# Patient Record
Sex: Female | Born: 1960 | Race: Black or African American | Hispanic: No | Marital: Married | Smoking: Former smoker
Health system: Southern US, Community
[De-identification: ages and names within clinical notes are randomized; demographics above are authoritative.]

## PROBLEM LIST (undated history)

## (undated) DIAGNOSIS — E119 Type 2 diabetes mellitus without complications: Secondary | ICD-10-CM

## (undated) DIAGNOSIS — E785 Hyperlipidemia, unspecified: Secondary | ICD-10-CM

## (undated) DIAGNOSIS — Z9289 Personal history of other medical treatment: Secondary | ICD-10-CM

## (undated) DIAGNOSIS — K5900 Constipation, unspecified: Secondary | ICD-10-CM

## (undated) DIAGNOSIS — T7840XA Allergy, unspecified, initial encounter: Secondary | ICD-10-CM

## (undated) HISTORY — DX: Hyperlipidemia, unspecified: E78.5

## (undated) HISTORY — PX: OTHER SURGICAL HISTORY: SHX169

## (undated) HISTORY — DX: Type 2 diabetes mellitus without complications: E11.9

## (undated) HISTORY — PX: MOUTH SURGERY: SHX715

## (undated) HISTORY — DX: Personal history of other medical treatment: Z92.89

## (undated) HISTORY — DX: Constipation, unspecified: K59.00

## (undated) HISTORY — DX: Allergy, unspecified, initial encounter: T78.40XA

## (undated) HISTORY — PX: ECTOPIC PREGNANCY SURGERY: SHX613

---

## 2006-01-17 ENCOUNTER — Emergency Department (HOSPITAL_COMMUNITY): Admission: EM | Admit: 2006-01-17 | Discharge: 2006-01-17 | Payer: Self-pay | Admitting: Emergency Medicine

## 2006-12-29 ENCOUNTER — Emergency Department (HOSPITAL_COMMUNITY): Admission: EM | Admit: 2006-12-29 | Discharge: 2006-12-30 | Payer: Self-pay | Admitting: Emergency Medicine

## 2007-03-11 ENCOUNTER — Emergency Department (HOSPITAL_COMMUNITY): Admission: EM | Admit: 2007-03-11 | Discharge: 2007-03-11 | Payer: Self-pay | Admitting: Emergency Medicine

## 2007-03-12 ENCOUNTER — Ambulatory Visit: Payer: Self-pay | Admitting: *Deleted

## 2007-09-13 ENCOUNTER — Emergency Department (HOSPITAL_COMMUNITY): Admission: EM | Admit: 2007-09-13 | Discharge: 2007-09-14 | Payer: Self-pay | Admitting: Emergency Medicine

## 2008-03-13 ENCOUNTER — Ambulatory Visit: Payer: Self-pay | Admitting: Nurse Practitioner

## 2008-03-13 DIAGNOSIS — F17200 Nicotine dependence, unspecified, uncomplicated: Secondary | ICD-10-CM | POA: Insufficient documentation

## 2008-03-13 DIAGNOSIS — N951 Menopausal and female climacteric states: Secondary | ICD-10-CM | POA: Insufficient documentation

## 2008-03-13 DIAGNOSIS — R51 Headache: Secondary | ICD-10-CM | POA: Insufficient documentation

## 2008-03-13 DIAGNOSIS — R519 Headache, unspecified: Secondary | ICD-10-CM | POA: Insufficient documentation

## 2008-03-13 DIAGNOSIS — E669 Obesity, unspecified: Secondary | ICD-10-CM | POA: Insufficient documentation

## 2008-03-13 DIAGNOSIS — Z9189 Other specified personal risk factors, not elsewhere classified: Secondary | ICD-10-CM | POA: Insufficient documentation

## 2008-03-14 ENCOUNTER — Encounter (INDEPENDENT_AMBULATORY_CARE_PROVIDER_SITE_OTHER): Payer: Self-pay | Admitting: Nurse Practitioner

## 2008-03-14 ENCOUNTER — Ambulatory Visit (HOSPITAL_COMMUNITY): Admission: RE | Admit: 2008-03-14 | Discharge: 2008-03-14 | Payer: Self-pay | Admitting: Internal Medicine

## 2008-03-17 ENCOUNTER — Telehealth (INDEPENDENT_AMBULATORY_CARE_PROVIDER_SITE_OTHER): Payer: Self-pay | Admitting: Nurse Practitioner

## 2008-04-21 ENCOUNTER — Ambulatory Visit: Payer: Self-pay | Admitting: Nurse Practitioner

## 2008-04-21 DIAGNOSIS — K029 Dental caries, unspecified: Secondary | ICD-10-CM | POA: Insufficient documentation

## 2008-04-21 LAB — CONVERTED CEMR LAB
Glucose, Urine, Semiquant: NEGATIVE
Nitrite: NEGATIVE
Protein, U semiquant: NEGATIVE
Urobilinogen, UA: 0.2
WBC Urine, dipstick: NEGATIVE
pH: 5

## 2008-04-22 ENCOUNTER — Encounter (INDEPENDENT_AMBULATORY_CARE_PROVIDER_SITE_OTHER): Payer: Self-pay | Admitting: Nurse Practitioner

## 2008-04-22 LAB — CONVERTED CEMR LAB
Alkaline Phosphatase: 110 units/L (ref 39–117)
BUN: 18 mg/dL (ref 6–23)
CO2: 22 meq/L (ref 19–32)
Cholesterol: 256 mg/dL — ABNORMAL HIGH (ref 0–200)
Eosinophils Absolute: 0.1 10*3/uL (ref 0.0–0.7)
Eosinophils Relative: 1 % (ref 0–5)
Glucose, Bld: 85 mg/dL (ref 70–99)
HCT: 41.4 % (ref 36.0–46.0)
HDL: 35 mg/dL — ABNORMAL LOW (ref >39)
Hemoglobin: 13.5 g/dL (ref 12.0–15.0)
LDL Cholesterol: 157 mg/dL — ABNORMAL HIGH (ref 0–99)
Lymphocytes Relative: 50 % — ABNORMAL HIGH (ref 12–46)
Lymphs Abs: 4.8 10*3/uL — ABNORMAL HIGH (ref 0.7–4.0)
MCV: 83.3 fL (ref 78.0–100.0)
Monocytes Absolute: 0.7 10*3/uL (ref 0.1–1.0)
Monocytes Relative: 8 % (ref 3–12)
RBC: 4.97 M/uL (ref 3.87–5.11)
Total Bilirubin: 0.4 mg/dL (ref 0.3–1.2)
Triglycerides: 320 mg/dL — ABNORMAL HIGH (ref <150)
VLDL: 64 mg/dL — ABNORMAL HIGH (ref 0–40)
WBC: 9.4 10*3/uL (ref 4.0–10.5)

## 2008-05-08 ENCOUNTER — Ambulatory Visit: Payer: Self-pay | Admitting: Nurse Practitioner

## 2008-05-09 ENCOUNTER — Encounter (INDEPENDENT_AMBULATORY_CARE_PROVIDER_SITE_OTHER): Payer: Self-pay | Admitting: Nurse Practitioner

## 2008-05-20 ENCOUNTER — Encounter: Admission: RE | Admit: 2008-05-20 | Discharge: 2008-05-20 | Payer: Self-pay | Admitting: Pulmonary Disease

## 2008-06-03 ENCOUNTER — Telehealth (INDEPENDENT_AMBULATORY_CARE_PROVIDER_SITE_OTHER): Payer: Self-pay | Admitting: Nurse Practitioner

## 2008-06-26 ENCOUNTER — Emergency Department (HOSPITAL_COMMUNITY): Admission: EM | Admit: 2008-06-26 | Discharge: 2008-06-27 | Payer: Self-pay | Admitting: Emergency Medicine

## 2008-06-29 ENCOUNTER — Emergency Department (HOSPITAL_COMMUNITY): Admission: EM | Admit: 2008-06-29 | Discharge: 2008-06-29 | Payer: Self-pay | Admitting: Emergency Medicine

## 2008-07-04 ENCOUNTER — Emergency Department (HOSPITAL_COMMUNITY): Admission: EM | Admit: 2008-07-04 | Discharge: 2008-07-04 | Payer: Self-pay | Admitting: *Deleted

## 2008-07-29 ENCOUNTER — Ambulatory Visit: Payer: Self-pay | Admitting: Nurse Practitioner

## 2008-07-29 DIAGNOSIS — E7800 Pure hypercholesterolemia, unspecified: Secondary | ICD-10-CM | POA: Insufficient documentation

## 2008-07-29 DIAGNOSIS — B351 Tinea unguium: Secondary | ICD-10-CM | POA: Insufficient documentation

## 2008-07-29 LAB — CONVERTED CEMR LAB
Cholesterol, target level: 200 mg/dL
HDL goal, serum: 40 mg/dL

## 2008-07-30 ENCOUNTER — Encounter (INDEPENDENT_AMBULATORY_CARE_PROVIDER_SITE_OTHER): Payer: Self-pay | Admitting: Nurse Practitioner

## 2008-07-30 LAB — CONVERTED CEMR LAB
ALT: 13 units/L (ref 0–35)
AST: 14 units/L (ref 0–37)
Bilirubin, Direct: 0.1 mg/dL (ref 0.0–0.3)
Cholesterol: 142 mg/dL (ref 0–200)
Indirect Bilirubin: 0.5 mg/dL (ref 0.0–0.9)
Total CHOL/HDL Ratio: 3.7
Total Protein: 7.7 g/dL (ref 6.0–8.3)
Triglycerides: 96 mg/dL (ref <150)
VLDL: 19 mg/dL (ref 0–40)

## 2009-01-21 ENCOUNTER — Ambulatory Visit: Payer: Self-pay | Admitting: Nurse Practitioner

## 2009-01-21 ENCOUNTER — Encounter: Payer: Self-pay | Admitting: Physician Assistant

## 2009-01-21 DIAGNOSIS — B009 Herpesviral infection, unspecified: Secondary | ICD-10-CM | POA: Insufficient documentation

## 2009-01-23 LAB — CONVERTED CEMR LAB
ALT: 9 units/L (ref 0–35)
AST: 10 units/L (ref 0–37)
Albumin: 4.2 g/dL (ref 3.5–5.2)
Bilirubin, Direct: 0.1 mg/dL (ref 0.0–0.3)
Vit D, 25-Hydroxy: 22 ng/mL — ABNORMAL LOW (ref 30–89)

## 2009-12-23 ENCOUNTER — Emergency Department (HOSPITAL_COMMUNITY): Admission: EM | Admit: 2009-12-23 | Discharge: 2009-12-23 | Payer: Self-pay | Admitting: Emergency Medicine

## 2009-12-31 ENCOUNTER — Ambulatory Visit: Payer: Self-pay | Admitting: Nurse Practitioner

## 2009-12-31 DIAGNOSIS — G4700 Insomnia, unspecified: Secondary | ICD-10-CM | POA: Insufficient documentation

## 2009-12-31 LAB — CONVERTED CEMR LAB
AST: 16 units/L (ref 0–37)
BUN: 11 mg/dL (ref 6–23)
Basophils Absolute: 0 10*3/uL (ref 0.0–0.1)
Basophils Relative: 0 % (ref 0–1)
Blood in Urine, dipstick: NEGATIVE
Calcium: 9.1 mg/dL (ref 8.4–10.5)
Chlamydia, DNA Probe: NEGATIVE
Chloride: 107 meq/L (ref 96–112)
Cholesterol: 215 mg/dL — ABNORMAL HIGH (ref 0–200)
Creatinine, Ser: 1.17 mg/dL (ref 0.40–1.20)
Eosinophils Relative: 1 % (ref 0–5)
Glucose, Urine, Semiquant: NEGATIVE
HCT: 37.1 % (ref 36.0–46.0)
HDL: 34 mg/dL — ABNORMAL LOW (ref >39)
Hemoglobin: 12.5 g/dL (ref 12.0–15.0)
KOH Prep: NEGATIVE
MCHC: 33.7 g/dL (ref 30.0–36.0)
MCV: 83 fL (ref 78.0–100.0)
Monocytes Absolute: 0.5 10*3/uL (ref 0.1–1.0)
Monocytes Relative: 6 % (ref 3–12)
Neutro Abs: 3.4 10*3/uL (ref 1.7–7.7)
Nitrite: NEGATIVE
RBC: 4.47 M/uL (ref 3.87–5.11)
RDW: 13.8 % (ref 11.5–15.5)
Specific Gravity, Urine: 1.015
TSH: 1.691 microintl units/mL (ref 0.350–4.500)
Total Bilirubin: 0.7 mg/dL (ref 0.3–1.2)
Total CHOL/HDL Ratio: 6.3
VLDL: 21 mg/dL (ref 0–40)
WBC Urine, dipstick: NEGATIVE
pH: 6

## 2010-01-01 ENCOUNTER — Encounter (INDEPENDENT_AMBULATORY_CARE_PROVIDER_SITE_OTHER): Payer: Self-pay | Admitting: Nurse Practitioner

## 2010-01-06 ENCOUNTER — Ambulatory Visit (HOSPITAL_COMMUNITY): Admission: RE | Admit: 2010-01-06 | Discharge: 2010-01-06 | Payer: Self-pay | Admitting: Internal Medicine

## 2010-01-06 ENCOUNTER — Encounter (INDEPENDENT_AMBULATORY_CARE_PROVIDER_SITE_OTHER): Payer: Self-pay | Admitting: Nurse Practitioner

## 2010-05-23 ENCOUNTER — Encounter: Payer: Self-pay | Admitting: Family Medicine

## 2010-06-01 NOTE — Letter (Signed)
Summary: Lipid Letter  [facility]  [address] [place], [place] [address]   Phone: [phone]  Fax: [phone]    01/01/2010  Julie Dawson [address] [place], [place]  [address]  Dear Julie Dawson:  We have carefully reviewed your last lipid profile from 12/31/2009 and the results are noted below with a summary of recommendations for lipid management.    Cholesterol:       215     Goal: less than 200   HDL "good" Cholesterol:   34     Goal: greater than 40   LDL "bad" Cholesterol:   160     Goal: less than 130   Triglycerides:       104     Goal: less than 150   Labs done during your recent office visit shows that your cholesterol is slightly elevated but not as high as it was in 2009.  OK to stay off medcations for now but to get numbers at goal you will need to start a low fat, low cholesterol diet.  PAP Smear  results ___________________________.    Current Medications: 1)    Pravastatin Sodium 40 Mg Tabs (Pravastatin sodium) .... Hold 2)    Acyclovir 400 Mg Tabs (Acyclovir) .... One tablet by mouth three times a day 3)    Naproxen 500 Mg Tabs (Naproxen) .... One tablet by mouth two times a day as needed for pain 4)    Amitriptyline Hcl 25 Mg Tabs (Amitriptyline hcl) .... One tablet by mouth nightly as needed for sleep 5)    Fluconazole 150 Mg Tabs (Fluconazole) .... One tablet by mouth x 1 dose  If you have any questions, please call. We appreciate being able to work with you.   Sincerely,    [facility] Lehman Prom FNP

## 2010-06-01 NOTE — Letter (Signed)
Summary: *HSN Results Follow up  [facility]  [address] [place], [place] [address]   Phone: [phone]  Fax: [phone]      01/06/2010   Julie Dawson [address] [place], [place]  [address]   Dear  Ms. Julie Dawson,                            ____S.Drinkard,FNP   ____D. Gore,FNP       ____B. McPherson,MD   ____V. Rankins,MD    ____E. Mulberry,MD    __X__N. Daphine Deutscher, FNP  ____D. Reche Dixon, MD    ____K. Philipp Deputy, MD    ____Other     This letter is to inform you that your recent test(s):  ___X____Pap Smear    _______Lab Test     _______X-ray    ___X____ is within acceptable limits  _______ requires a medication change  _______ requires a follow-up lab visit  _______ requires a follow-up visit with your [name]   Comments: Pap Smear results are normal.       _________________________________________________________ If you have any questions, please contact our office [phone].                    Sincerely,    Lehman Prom FNP [facility]

## 2010-06-01 NOTE — Progress Notes (Signed)
Summary: Office Visit//DEPRESSION SCREENING  Office Visit//DEPRESSION SCREENING   Imported By: Arta Bruce 12/31/2009 14:33:02  _____________________________________________________________________  External Attachment:    Type:   Image     Comment:   External Document

## 2010-06-01 NOTE — Assessment & Plan Note (Signed)
Summary: Complete Physical Exam   Vital Signs:  Patient profile:   50 year old female Menstrual status:  postmenopausal Height:      62.25 inches Weight:      163 pounds BMI:     29.68 Temp:     97.0 degrees F oral Pulse rate:   80 / minute Pulse rhythm:   regular Resp:     20 per minute BP sitting:   116 / 74  (left arm) Cuff size:   regular  Vitals Entered By: CMA Student Linzie Collin  Nutrition Counseling: Patient's BMI is greater than 25 and therefore counseled on weight management options. CC: CPP, Lipid Management Is Patient Diabetic? No Pain Assessment Patient in pain? no       Does patient need assistance? Functional Status Self care Ambulation Normal   CC:  CPP and Lipid Management.  History of Present Illness:  Pt into the office for a complete physical exam  PAP - last pap 04/2008, normal results.  All previous pap smears have been normal. No family history of cervical and ovarian cancer menses last at age 50.  postmenopausal  Mammogram - no family history of breast cancer no self breast exams at home  Tdap - up to date  S/p fall 2 weeks ago - she was walking down steps and when she got to the last step there was a puddle of water and she slid on the ground.  At the time her right wrist and right knee started to swell following the injury.  Now the left leg is bothering her.  Also some tension on the left side of the face. 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  Wears reading glasess for reading and use of the computer.  Dental - Pt has not been to the dentist in the past 2 years. She has been to [facility] by [facility] but she was not allowed to go back.  She does have some cavities in her mouth and actually needs extraction of one of her front incisors.  Lipid Management  History:      Positive NCEP/ATP III risk factors include HDL cholesterol less than 40, family history for ischemic heart disease (males less than 48 years old), and current tobacco user.  Negative NCEP/ATP III risk factors include female age less than 4 years old, no history of early menopause without estrogen hormone replacement, non-diabetic, non-hypertensive, no ASHD (atherosclerotic heart disease), no prior stroke/TIA, no peripheral vascular disease, and no history of aortic aneurysm.        The patient states that she does not know about the "Therapeutic Lifestyle Change" diet.  Her compliance with the TLC diet is good.  The patient does not know about adjunctive measures for cholesterol lowering.  Comments include: pt is NOT taking cholesterol medication as she has not been here for f/u.     Habits & Providers  Alcohol-Tobacco-Diet     Alcohol drinks/day: 0     Tobacco Status: current     Cigarette Packs/Day: 1/2 or more     Year Started: age 43  Exercise-Depression-Behavior     Does Patient Exercise: no     Have you felt down or hopeless? no     Have you felt little pleasure in things? no  Depression Counseling: not indicated; screening negative for depression     Drug Use: past     Seat Belt Use: 100     Sun Exposure: occasionally  Comments: PHQ-9 = 1  Current Medications (verified): 1)  Pravastatin Sodium 40 Mg Tabs (Pravastatin Sodium) .... Hold 2)  Acyclovir 400 Mg Tabs (Acyclovir) .... One Tablet By Mouth Three Times A Day 3)  Naproxen 500 Mg Tabs (Naproxen) .... One Tablet By Mouth Two Times A Day As Needed For Pain 4)  Amitriptyline Hcl 25 Mg Tabs (Amitriptyline Hcl) .... One Tablet By Mouth Nightly As Needed For Sleep  Allergies (verified): No Known Drug Allergies  Social History: Does Patient Exercise:  no Drug Use:  past  Review of Systems General:  Complains of sleep disorder; Pt has trouble staying asleep.  She goes to bed at [facility] because she has to get up  at [facility] for work.  she goes to sleep and sleeps for abour 2-3 hours then wakes. Sometimes she can go back to sleep and sometimes she can't.  Started about 6 months ago with the transition to night shift.. Eyes:  Complains of blurring. ENT:  Denies earache. CV:  Denies chest pain or discomfort. Resp:  Denies cough. GI:  Complains of constipation; denies abdominal pain, nausea, and vomiting. GU:  Denies discharge. MS:  Complains of joint pain; left hip and left knee. Derm:  Denies dryness. Neuro:  Denies headaches. Psych:  Denies anxiety and depression.  Physical Exam  General:  alert.   Head:  normocephalic.   Eyes:  pupils equal, pupils round, and pupils reactive to light.   Ears:  bil TM with bony landmarks present no erythema Nose:  no nasal discharge.   Mouth:  pharynx pink and moist and poor dentition.   Neck:  supple.   Chest Wall:  no mass.   Breasts:  skin/areolae normal, no masses, and no abnormal thickening.   Lungs:  normal breath sounds.   Heart:  normal rate and regular rhythm.   Abdomen:  soft, non-tender, and normal bowel sounds.   Rectal:  external hemorrhoid(s).   Msk:  normal ROM.   Skin:  color normal.   Psych:  Oriented X3.    Pelvic Exam  Vulva:      normal appearance.   Urethra and Bladder:      Urethra--normal.   Vagina:      copious discharge.   Cervix:      midposition.   Uterus:      smooth.   Adnexa:      nontender bilaterally.   Rectum:      heme negative stool, + external hemorrhoids.     Knee Exam  Knee Exam:    Left:    Inspection:  Normal    Palpation:  Normal    Stability:  stable    Tenderness:  medial collateral    Swelling:  no    Erythema:  no    Hip Exam  Hip Exam:    Left:    Inspection:  Normal    Palpation:  Abnormal       Location:  trochanteric bursa    Stability:  stable    Tenderness:  no    Swelling:  no    Erythema:  no   Impression & Recommendations:  Problem # 1:  ROUTINE GYNECOLOGICAL  EXAMINATION (ICD-V72.31) labs done PAP done rec optho and dental exam (dental clinic handout given) PHQ-1 score = 1 tdap up to  date Orders: Hemoccult Guaiac-1 spec.(in office) (82270) UA Dipstick w/o Micro (manual) (16109) KOH/ WET Mount [phone]) Pap Smear, Thin Prep ( Collection of) [phone]) T- GC Chlamydia (91478) T-Comprehensive Metabolic Panel [phone]) T-CBC w/Diff ([address]) Rapid HIV  (28413) T-TSH (24401-02725)  Problem # 2:  OTHER SCREENING BREAST EXAMINATION (ICD-V76.19) self breast exam placcard given to pt mammogram scheduled Orders: Mammogram (Screening) (Mammo)  Problem # 3:  TOBACCO ABUSE (ICD-305.1) advised cessation  Problem # 4:  OBESITY (ICD-278.00)  Orders: T-TSH ([id number])  Problem # 5:  CONTUSION OF MULTIPLE SITES NEC (ICD-924.8) anti-inflammatories  Problem # 6:  INSOMNIA (ICD-780.52) will given amitriptyline as needed for rest  Problem # 7:  CANDIDIASIS (ICD-112.9) noted on wet prep will give diflucan x 1 dose  Complete Medication List: 1)  Pravastatin Sodium 40 Mg Tabs (Pravastatin sodium) .... Hold 2)  Acyclovir 400 Mg Tabs (Acyclovir) .... One tablet by mouth three times a day 3)  Naproxen 500 Mg Tabs (Naproxen) .... One tablet by mouth two times a day as needed for pain 4)  Amitriptyline Hcl 25 Mg Tabs (Amitriptyline hcl) .... One tablet by mouth nightly as needed for sleep 5)  Fluconazole 150 Mg Tabs (Fluconazole) .... One tablet by mouth x 1 dose  Other Orders: T-Lipid Profile ([id number])  Lipid Assessment/Plan:      Based on NCEP/ATP III, the patient's risk factor category is "2 or more risk factors and a calculated 10 year CAD risk of < 20%".  The patient's lipid goals are as follows: Total cholesterol goal is 200; LDL cholesterol goal is 130; HDL cholesterol goal is 40; Triglyceride goal is 150.    Patient Instructions: 1)  Pain in left hip and knee is likely due to bruised muscles and ligaments during your fall. 2)   Take naprosyn 500mg  by mouth two times a day (with food) for 3 days (Thursday, Friday, Saturday) then as needed. 3)  Sleep - Try to avoid caffiene and nicotine within 2 hours of going to bed.  may take amitriptyline 25mg  as needed for sleep.  I would advise you take this initially on the weekend so that you can see how effective it is 4)  Constipation - you should eat more foods with fiber such as fresh veges and fruit.  Also eat brown bread and cereal such as raisin bran or fiber one 5)  Follow up as needed Prescriptions: FLUCONAZOLE 150 MG TABS (FLUCONAZOLE) One tablet by mouth x 1 dose  #1 x 0   Entered and Authorized by:   Lehman Prom FNP   Signed by:   Lehman Prom FNP on 12/31/2009   Method used:   Print then Give to Patient   RxID:   [id number] AMITRIPTYLINE HCL 25 MG TABS (AMITRIPTYLINE HCL) One tablet by mouth nightly as needed for sleep  #30 x 0   Entered and Authorized by:   Lehman Prom FNP   Signed by:   Lehman Prom FNP on 12/31/2009   Method used:   Print then Give to Patient   RxID:   [id number] NAPROXEN 500 MG TABS (NAPROXEN) One tablet by mouth two times a day as needed for pain  #30 x 0   Entered and Authorized by:   Lehman Prom FNP   Signed by:   Lehman Prom FNP on 12/31/2009   Method used:   Print then Give to Patient   RxID:   [id number]   Laboratory Results   Urine Tests    Routine Urinalysis  Color: yellow Glucose: negative   (Normal Range: Negative) Bilirubin: negative   (Normal Range: Negative) Ketone: negative   (Normal Range: Negative) Spec. Gravity: 1.015   (Normal Range: 1.003-1.035) Blood: negative   (Normal Range: Negative) pH: 6.0   (Normal Range: 5.0-8.0) Protein: negative   (Normal Range: Negative) Urobilinogen: 1.0   (Normal Range: 0-1) Nitrite: negative   (Normal Range: Negative) Leukocyte Esterace: negative   (Normal Range: Negative)    Date/Time Received: December 31, 2009 10:47 AM   Wet  Mount/KOH Source: vaginal WBC/hpf: 1-5 Bacteria/hpf: rare Clue cells/hpf: none Yeast/hpf: moderate Trichomonas/hpf: none  Other Tests  Rapid HIV: negative  Stool - Occult Blood Hemmoccult #1: negative Date: 12/31/2009     Osteoporosis  Patient complains of: kyphosis No back pain No prior fracture No height loss No  Patient reports: Personal history of fracture No Hx of Fx in a 1st degree relative No Caucasian/Asian Race No Advanced Age No Female Gender Yes Dementia No Poor health/fragility No Current cigarette smoker Yes Low body weight (<127 lbs) No Estrogen deficiency Yes Low calcium intake (lifelong) No Alcoholism No Inadequate physical activity No Poor eyesight/risk of falls No  Laboratory Results   Urine Tests    Routine Urinalysis   Color: yellow Glucose: negative   (Normal Range: Negative) Bilirubin: negative   (Normal Range: Negative) Ketone: negative   (Normal Range: Negative) Spec. Gravity: 1.015   (Normal Range: 1.003-1.035) Blood: negative   (Normal Range: Negative) pH: 6.0   (Normal Range: 5.0-8.0) Protein: negative   (Normal Range: Negative) Urobilinogen: 1.0   (Normal Range: 0-1) Nitrite: negative   (Normal Range: Negative) Leukocyte Esterace: negative   (Normal Range: Negative)      Wet Mount Wet Mount KOH: Negative  Other Tests  Rapid HIV: negative  Stool - Occult Blood Hemmoccult #1: negative

## 2010-06-04 ENCOUNTER — Telehealth (INDEPENDENT_AMBULATORY_CARE_PROVIDER_SITE_OTHER): Payer: Self-pay | Admitting: Nurse Practitioner

## 2010-06-08 ENCOUNTER — Encounter (INDEPENDENT_AMBULATORY_CARE_PROVIDER_SITE_OTHER): Payer: Self-pay | Admitting: *Deleted

## 2010-06-08 DIAGNOSIS — R7611 Nonspecific reaction to tuberculin skin test without active tuberculosis: Secondary | ICD-10-CM | POA: Insufficient documentation

## 2010-06-09 ENCOUNTER — Encounter (INDEPENDENT_AMBULATORY_CARE_PROVIDER_SITE_OTHER): Payer: Self-pay | Admitting: Nurse Practitioner

## 2010-06-09 ENCOUNTER — Ambulatory Visit (HOSPITAL_COMMUNITY)
Admission: RE | Admit: 2010-06-09 | Discharge: 2010-06-09 | Disposition: A | Discharge status: Home / Self Care | Payer: Self-pay | Source: Ambulatory Visit | Attending: Internal Medicine | Admitting: Internal Medicine

## 2010-06-09 ENCOUNTER — Other Ambulatory Visit: Payer: Self-pay

## 2010-06-09 DIAGNOSIS — R7611 Nonspecific reaction to tuberculin skin test without active tuberculosis: Secondary | ICD-10-CM

## 2010-06-15 ENCOUNTER — Telehealth (INDEPENDENT_AMBULATORY_CARE_PROVIDER_SITE_OTHER): Payer: Self-pay | Admitting: *Deleted

## 2010-06-16 ENCOUNTER — Encounter (INDEPENDENT_AMBULATORY_CARE_PROVIDER_SITE_OTHER): Payer: Self-pay | Admitting: Nurse Practitioner

## 2010-06-17 NOTE — Miscellaneous (Signed)
Summary: TB skin Test  Clinical Lists Changes  Pt has a history of positive PPD in the past.  She will need CXRAY to determine the reason.    Problems: Added new problem of NONSPEC REACT TUBERCULIN SKIN 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 (ICD-795.51) Orders: Added new Test order of Radiology other (Radiology Other) - Signed

## 2010-06-17 NOTE — Progress Notes (Signed)
Summary: Form  Phone Note Outgoing Call   Summary of Call: form received however pt does need a PPD skin test before I can complete the form schedule her for a PPD skin test and a return visit for reading whenever her schedule allows Initial call taken by: Lehman Prom FNP,  June 04, 2010 2:12 PM  Follow-up for Phone Call        CALLED PT MADE APPT FOR PPD 06/08/10 Follow-up by: Arta Bruce,  June 07, 2010 11:11 AM  Additional Follow-up for Phone Call Additional follow up Details #1::        noted Additional Follow-up by: Lehman Prom FNP,  June 07, 2010 12:47 PM

## 2010-06-23 NOTE — Letter (Signed)
Summary: MEDICAL EVAL. DEPT OF SOCIAL SERVICES//TO PICK UP  MEDICAL EVAL. DEPT OF SOCIAL SERVICES//TO PICK UP   Imported By: Arta Bruce 06/16/2010 10:49:18  _____________________________________________________________________  External Attachment:    Type:   Image     Comment:   External Document

## 2010-06-23 NOTE — Progress Notes (Signed)
Summary: Xray results and form  Phone Note Call from Patient   Summary of Call: PT CALLED TO SEE IF HER RESULTS ARE BACK ON HER X-RAY SHE NEEDS TO TAKE HER FORM BACK TO WORK Initial call taken by: Arta Bruce,  June 15, 2010 1:45 PM  Follow-up for Phone Call        No , I don't see x-ray results in EMR - pull hospital records and see if there are CXRAY results from this week. i have the form - just waiting those results Follow-up by: Lehman Prom FNP,  June 16, 2010 8:11 AM  Additional Follow-up for Phone Call Additional follow up Details #1::        xray report on your cubby desk.  Dutch Quint RN  June 16, 2010 10:04 AM     Additional Follow-up for Phone Call Additional follow up Details #2::    form completed - have pt return to pick it up make copy for the chart Follow-up by: Lehman Prom FNP,  June 16, 2010 10:33 AM  Additional Follow-up for Phone Call Additional follow up Details #3:: Details for Additional Follow-up Action Taken: PT CALLED WAS TOLD FORM READY WILL PICK UP Additional Follow-up by: Arta Bruce,  June 16, 2010 10:50 AM    CXR  Procedure date:  06/09/2010  Findings:      No acute findings. No evidence of active tuberculosis   CXR  Procedure date:  06/09/2010  Findings:      No acute findings. No evidence of active tuberculosis

## 2010-07-26 ENCOUNTER — Emergency Department (HOSPITAL_COMMUNITY)
Admission: EM | Admit: 2010-07-26 | Discharge: 2010-07-26 | Disposition: A | Discharge status: Home / Self Care | Payer: Self-pay | Attending: Emergency Medicine | Admitting: Emergency Medicine

## 2010-07-26 DIAGNOSIS — Z0389 Encounter for observation for other suspected diseases and conditions ruled out: Secondary | ICD-10-CM | POA: Insufficient documentation

## 2010-11-28 ENCOUNTER — Emergency Department (HOSPITAL_COMMUNITY)
Admission: EM | Admit: 2010-11-28 | Discharge: 2010-11-28 | Disposition: A | Discharge status: Home / Self Care | Payer: Self-pay | Attending: Emergency Medicine | Admitting: Emergency Medicine

## 2010-11-28 DIAGNOSIS — E785 Hyperlipidemia, unspecified: Secondary | ICD-10-CM | POA: Insufficient documentation

## 2010-11-28 DIAGNOSIS — K089 Disorder of teeth and supporting structures, unspecified: Secondary | ICD-10-CM | POA: Insufficient documentation

## 2010-11-28 DIAGNOSIS — R220 Localized swelling, mass and lump, head: Secondary | ICD-10-CM | POA: Insufficient documentation

## 2010-11-28 DIAGNOSIS — R221 Localized swelling, mass and lump, neck: Secondary | ICD-10-CM | POA: Insufficient documentation

## 2011-01-01 ENCOUNTER — Emergency Department (HOSPITAL_COMMUNITY)
Admission: EM | Admit: 2011-01-01 | Discharge: 2011-01-01 | Disposition: A | Discharge status: Home / Self Care | Payer: Self-pay | Attending: Emergency Medicine | Admitting: Emergency Medicine

## 2011-01-01 DIAGNOSIS — E785 Hyperlipidemia, unspecified: Secondary | ICD-10-CM | POA: Insufficient documentation

## 2011-01-01 DIAGNOSIS — K6289 Other specified diseases of anus and rectum: Secondary | ICD-10-CM | POA: Insufficient documentation

## 2011-01-01 DIAGNOSIS — L02219 Cutaneous abscess of trunk, unspecified: Secondary | ICD-10-CM | POA: Insufficient documentation

## 2011-02-11 LAB — RAPID STREP SCREEN (MED CTR MEBANE ONLY): Streptococcus, Group A Screen (Direct): NEGATIVE

## 2011-02-11 LAB — STREP A DNA PROBE: Group A Strep Probe: NEGATIVE

## 2011-02-27 ENCOUNTER — Emergency Department (HOSPITAL_COMMUNITY)
Admission: EM | Admit: 2011-02-27 | Discharge: 2011-02-27 | Disposition: A | Discharge status: Home / Self Care | Payer: Self-pay | Attending: Emergency Medicine | Admitting: Emergency Medicine

## 2011-02-27 DIAGNOSIS — A499 Bacterial infection, unspecified: Secondary | ICD-10-CM | POA: Insufficient documentation

## 2011-02-27 DIAGNOSIS — N760 Acute vaginitis: Secondary | ICD-10-CM | POA: Insufficient documentation

## 2011-02-27 DIAGNOSIS — B9689 Other specified bacterial agents as the cause of diseases classified elsewhere: Secondary | ICD-10-CM | POA: Insufficient documentation

## 2011-02-27 DIAGNOSIS — E785 Hyperlipidemia, unspecified: Secondary | ICD-10-CM | POA: Insufficient documentation

## 2011-02-27 DIAGNOSIS — L293 Anogenital pruritus, unspecified: Secondary | ICD-10-CM | POA: Insufficient documentation

## 2011-02-27 LAB — WET PREP, GENITAL: Yeast Wet Prep HPF POC: NONE SEEN

## 2011-02-27 LAB — URINALYSIS, ROUTINE W REFLEX MICROSCOPIC
Glucose, UA: NEGATIVE mg/dL
Hgb urine dipstick: NEGATIVE
Specific Gravity, Urine: 1.017 (ref 1.005–1.030)

## 2011-02-28 LAB — GC/CHLAMYDIA PROBE AMP, GENITAL
Chlamydia, DNA Probe: NEGATIVE
GC Probe Amp, Genital: NEGATIVE

## 2011-03-22 ENCOUNTER — Emergency Department (HOSPITAL_COMMUNITY)
Admission: EM | Admit: 2011-03-22 | Discharge: 2011-03-22 | Disposition: A | Discharge status: Home / Self Care | Payer: Self-pay | Attending: Emergency Medicine | Admitting: Emergency Medicine

## 2011-03-22 ENCOUNTER — Encounter: Payer: Self-pay | Admitting: Emergency Medicine

## 2011-03-22 DIAGNOSIS — R197 Diarrhea, unspecified: Secondary | ICD-10-CM | POA: Insufficient documentation

## 2011-03-22 DIAGNOSIS — R1110 Vomiting, unspecified: Secondary | ICD-10-CM | POA: Insufficient documentation

## 2011-03-22 DIAGNOSIS — F17200 Nicotine dependence, unspecified, uncomplicated: Secondary | ICD-10-CM | POA: Insufficient documentation

## 2011-03-22 DIAGNOSIS — R109 Unspecified abdominal pain: Secondary | ICD-10-CM | POA: Insufficient documentation

## 2011-03-22 LAB — CBC
HCT: 40.5 % (ref 36.0–46.0)
MCH: 28.2 pg (ref 26.0–34.0)
MCHC: 34.1 g/dL (ref 30.0–36.0)
RDW: 13.1 % (ref 11.5–15.5)

## 2011-03-22 LAB — OCCULT BLOOD, POC DEVICE: Fecal Occult Bld: NEGATIVE

## 2011-03-22 MED ORDER — ONDANSETRON 4 MG PO TBDP
4.0000 mg | ORAL_TABLET | Freq: Once | ORAL | Status: AC
  Administered 2011-03-22: 4 mg via ORAL
  Filled 2011-03-22: qty 2

## 2011-03-22 MED ORDER — ONDANSETRON HCL 4 MG PO TABS: 4.0000 mg | ORAL_TABLET | Freq: Three times a day (TID) | ORAL | Status: AC | PRN

## 2011-03-22 NOTE — ED Notes (Signed)
N/v/d and chills since this am

## 2011-03-22 NOTE — ED Notes (Signed)
Pt is alert and talking and sts she took 6 iron pills last nite and sick on stomach.  Pt not actively vomiting and medicated with zofran and will offer fluid shortly

## 2011-03-22 NOTE — ED Provider Notes (Signed)
History     CSN: [id number] Arrival date & time: 03/22/2011 10:15 AM   First MD Initiated Contact with Patient 03/22/11 1033      Chief Complaint  Patient presents with  . Emesis    (Consider location/radiation/quality/duration/timing/severity/associated sxs/prior treatment) Patient is a 50 y.o. female presenting with vomiting. The history is provided by the patient.  Emesis  This is a new problem. The current episode started 3 to 5 hours ago. The problem occurs 2 to 4 times per day. The problem has not changed since onset.The emesis has an appearance of stomach contents. There has been no fever. Associated symptoms include abdominal pain and diarrhea. Pertinent negatives include no arthralgias, no chills, no cough, no fever, no headaches, no myalgias and no sweats. Risk factors: Took multiple iron tablets.    History reviewed. No pertinent past medical history.  History reviewed. No pertinent past surgical history.  No family history on file.  History  Substance Use Topics  . Smoking status: Current Everyday Smoker  . Smokeless tobacco: Not on file  . Alcohol Use: No    OB History    Grav Para Term Preterm Abortions TAB SAB Ect Mult Living                  Review of Systems  Constitutional: Negative for fever, chills, diaphoresis, activity change, appetite change, fatigue and unexpected weight change.  HENT: Negative.   Eyes: Negative.   Respiratory: Negative for cough, choking and chest tightness.   Cardiovascular: Negative.  Negative for chest pain and leg swelling.  Gastrointestinal: Positive for nausea, vomiting, abdominal pain and diarrhea. Negative for constipation, blood in stool, abdominal distention, anal bleeding and rectal pain.       Crampy pain, diarrhea times 2 episodes, dark, on iron  Genitourinary: Negative.   Musculoskeletal: Negative for myalgias and arthralgias.  Neurological: Negative for headaches.  Hematological: Negative.   All other systems  reviewed and are negative.    Allergies  Review of patient's allergies indicates no known allergies.  Home Medications   Current Outpatient Rx  Name Route Sig Dispense Refill  . IRON PO Oral Take 1-2 tablets by mouth daily.        BP 118/73  Pulse 105  Temp(Src) 98.1 F (36.7 C) (Oral)  Resp 20  SpO2 99%  Physical Exam  Constitutional: She is oriented to person, place, and time. She appears well-developed and well-nourished.  HENT:  Head: Normocephalic and atraumatic.  Eyes: EOM are normal. Pupils are equal, round, and reactive to light.  Neck: Normal range of motion. Neck supple.  Cardiovascular: Normal rate, regular rhythm and normal heart sounds.   No murmur heard. Pulmonary/Chest: Effort normal and breath sounds normal. No respiratory distress. She exhibits no tenderness.  Abdominal: Soft. Bowel sounds are normal. She exhibits no distension and no mass. There is tenderness. There is no rebound and no guarding.  Musculoskeletal: Normal range of motion.  Neurological: She is alert and oriented to person, place, and time. No cranial nerve deficit.  Skin: Skin is warm and dry.    ED Course  Procedures (including critical care time)   Labs Reviewed  CBC  OCCULT BLOOD, POC DEVICE   No results found.   1. Diarrhea   2. Emesis       MDM  Pt took 6 iron pills last night before going to bed and had diarrhea times 2 this am. Dark stool. Emesis times 1-2 along with that. Stomach contents, no blood.  Some crampy abdominal pain. Gave plasma on Saturday and takes iron because "you can only give if your iron is 38 and mine has been 37 in the past". Will check FOBT and CBC to ensure that she is not having blood loss in stool and not anemic.       11:10 AM Discussed results of hemoccult and CBC with pt and advised her not to take more than 1 iron pill per day and if she misses doses that she is not to take them all at once. Will watch PO trial and if tolerated pt will be  stable for discharge. FOBT negative, CBC normal.  12:00 PM Pt tolerating fluids and stable for discharge.  Genella Mech, MD 03/22/11 1200

## 2011-03-22 NOTE — ED Provider Notes (Signed)
I saw and evaluated the patient, reviewed the resident's note and I agree with the findings and plan. Well developed well nourished female nad- patient given zofran and kept fluids down here.  Hilario Quarry, MD 03/22/11 [phone]

## 2011-03-22 NOTE — ED Notes (Signed)
Gave plasma this past sat and yesterday she took 4 iron tabs

## 2011-04-24 ENCOUNTER — Encounter (HOSPITAL_COMMUNITY): Payer: Self-pay | Admitting: *Deleted

## 2011-04-24 ENCOUNTER — Emergency Department (HOSPITAL_COMMUNITY)
Admission: EM | Admit: 2011-04-24 | Discharge: 2011-04-24 | Disposition: A | Discharge status: Home / Self Care | Payer: Self-pay | Attending: Emergency Medicine | Admitting: Emergency Medicine

## 2011-04-24 DIAGNOSIS — T1590XA Foreign body on external eye, part unspecified, unspecified eye, initial encounter: Secondary | ICD-10-CM | POA: Insufficient documentation

## 2011-04-24 DIAGNOSIS — H10219 Acute toxic conjunctivitis, unspecified eye: Secondary | ICD-10-CM | POA: Insufficient documentation

## 2011-04-24 MED ORDER — FLUORESCEIN SODIUM 1 MG OP STRP
1.0000 | ORAL_STRIP | Freq: Once | OPHTHALMIC | Status: AC
  Administered 2011-04-24: 1 via OPHTHALMIC
  Filled 2011-04-24: qty 1

## 2011-04-24 MED ORDER — SULFACETAMIDE SODIUM 10 % OP SOLN: 2.0000 [drp] | OPHTHALMIC | Status: AC

## 2011-04-24 MED ORDER — HYDROCODONE-ACETAMINOPHEN 5-325 MG PO TABS: 1.0000 | ORAL_TABLET | Freq: Four times a day (QID) | ORAL | Status: AC | PRN

## 2011-04-24 MED ORDER — TETRACAINE HCL 0.5 % OP SOLN
1.0000 [drp] | Freq: Once | OPHTHALMIC | Status: AC
  Administered 2011-04-24: 1 [drp] via OPHTHALMIC
  Filled 2011-04-24: qty 2

## 2011-04-24 NOTE — ED Notes (Signed)
Morgan lens applied to left eye. 500 cc bag of saline infusing. Procedure uncomplicated.

## 2011-04-24 NOTE — ED Notes (Signed)
Pt reports dropping a bottle of bleach and it splashed into her left eye. No changes in vision but "feels like pebbles in her eye."

## 2011-04-28 NOTE — ED Provider Notes (Signed)
Medical screening examination/treatment/procedure(s) were performed by non-physician practitioner and as supervising physician I was immediately available for consultation/collaboration.  Nicholes Stairs, MD 04/28/11 1114

## 2011-07-14 NOTE — ED Provider Notes (Signed)
History     CSN: [id number]  Arrival date & time 04/24/11  1132   First MD Initiated Contact with Patient 04/24/11 1404      Chief Complaint  Patient presents with  . Eye Injury    (Consider location/radiation/quality/duration/timing/severity/associated sxs/prior treatment) HPI The patient presents to the ER following dropping a bleach bottle and it splashing in her L eye. The patient denies visual changes, headache, N/V, or SOB. The patient does feel like there is a grainy feeling in her eye. The patient denies any other symptoms.  History reviewed. No pertinent past medical history.  History reviewed. No pertinent past surgical history.  History reviewed. No pertinent family history.  History  Substance Use Topics  . Smoking status: Current Everyday Smoker  . Smokeless tobacco: Not on file  . Alcohol Use: No    OB History    Grav Para Term Preterm Abortions TAB SAB Ect Mult Living                  Review of Systems All pertinent positives and negatives reviewed in the history of present illness  Allergies  Review of patient's allergies indicates no known allergies.  Home Medications  No current outpatient prescriptions on file.  BP 118/77  Pulse 80  Temp(Src) 97.2 F (36.2 C) (Oral)  Resp 18  SpO2 100%  Physical Exam  Constitutional: She appears well-developed and well-nourished. No distress.  HENT:  Head: Normocephalic and atraumatic.  Right Ear: Tympanic membrane normal.  Left Ear: Tympanic membrane normal.  Nose: Nose normal.  Mouth/Throat: Uvula is midline, oropharynx is clear and moist and mucous membranes are normal.  Eyes: Lids are normal. Right conjunctiva is injected.    Cardiovascular: Normal rate and regular rhythm.   Pulmonary/Chest: Effort normal and breath sounds normal.  Skin: Skin is warm and dry.    ED Course  Procedures (including critical care time)  Labs Reviewed - No data to display No results found.   1. Chemical  conjunctivitis     The patient is referred to opthamology. She is given eye drops and pain control. The pH was normal in her eye. Told to return here as needed for any worsening in her condition.  MDM          Carlyle Dolly, PA-C 07/14/11 1538

## 2011-07-15 NOTE — ED Provider Notes (Signed)
Medical screening examination/treatment/procedure(s) were performed by non-physician practitioner and as supervising physician I was immediately available for consultation/collaboration.  Cheri Guppy, MD 07/15/11 [phone]

## 2011-10-08 ENCOUNTER — Emergency Department (HOSPITAL_COMMUNITY): Payer: Self-pay

## 2011-10-08 ENCOUNTER — Encounter (HOSPITAL_COMMUNITY): Payer: Self-pay | Admitting: *Deleted

## 2011-10-08 ENCOUNTER — Emergency Department (HOSPITAL_COMMUNITY)
Admission: EM | Admit: 2011-10-08 | Discharge: 2011-10-08 | Disposition: A | Discharge status: Home / Self Care | Payer: Self-pay | Attending: Emergency Medicine | Admitting: Emergency Medicine

## 2011-10-08 DIAGNOSIS — S7000XA Contusion of unspecified hip, initial encounter: Secondary | ICD-10-CM | POA: Insufficient documentation

## 2011-10-08 DIAGNOSIS — Y92410 Unspecified street and highway as the place of occurrence of the external cause: Secondary | ICD-10-CM | POA: Insufficient documentation

## 2011-10-08 DIAGNOSIS — S8000XA Contusion of unspecified knee, initial encounter: Secondary | ICD-10-CM | POA: Insufficient documentation

## 2011-10-08 DIAGNOSIS — F17200 Nicotine dependence, unspecified, uncomplicated: Secondary | ICD-10-CM | POA: Insufficient documentation

## 2011-10-08 DIAGNOSIS — T148XXA Other injury of unspecified body region, initial encounter: Secondary | ICD-10-CM
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
## 2011-10-08 NOTE — ED Notes (Signed)
Discharge via teach back
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
## 2011-10-09 NOTE — ED Provider Notes (Signed)
Medical screening examination/treatment/procedure(s) were performed by non-physician practitioner and as supervising physician I was immediately available for consultation/collaboration.   Carleene Cooper III, MD 10/09/11 1341

## 2012-01-25 ENCOUNTER — Emergency Department (HOSPITAL_COMMUNITY)
Admission: EM | Admit: 2012-01-25 | Discharge: 2012-01-25 | Disposition: A | Discharge status: Home / Self Care | Payer: Self-pay | Attending: Emergency Medicine | Admitting: Emergency Medicine

## 2012-01-25 ENCOUNTER — Emergency Department (HOSPITAL_COMMUNITY): Admission: EM | Admit: 2012-01-25 | Discharge: 2012-01-25 | Payer: Self-pay | Source: Home / Self Care

## 2012-01-25 DIAGNOSIS — N939 Abnormal uterine and vaginal bleeding, unspecified: Secondary | ICD-10-CM

## 2012-01-25 DIAGNOSIS — A599 Trichomoniasis, unspecified: Secondary | ICD-10-CM | POA: Insufficient documentation

## 2012-01-25 DIAGNOSIS — N898 Other specified noninflammatory disorders of vagina: Secondary | ICD-10-CM | POA: Insufficient documentation

## 2012-01-25 DIAGNOSIS — F17200 Nicotine dependence, unspecified, uncomplicated: Secondary | ICD-10-CM | POA: Insufficient documentation

## 2012-01-25 DIAGNOSIS — N926 Irregular menstruation, unspecified: Secondary | ICD-10-CM

## 2012-01-25 LAB — CBC WITH DIFFERENTIAL/PLATELET
Basophils Absolute: 0 10*3/uL (ref 0.0–0.1)
Basophils Relative: 0 % (ref 0–1)
Eosinophils Relative: 1 % (ref 0–5)
Lymphocytes Relative: 46 % (ref 12–46)
MCHC: 34.1 g/dL (ref 30.0–36.0)
MCV: 81.7 fL (ref 78.0–100.0)
Platelets: 237 10*3/uL (ref 150–400)
RDW: 13.2 % (ref 11.5–15.5)
WBC: 9.2 10*3/uL (ref 4.0–10.5)

## 2012-01-25 LAB — WET PREP, GENITAL: Yeast Wet Prep HPF POC: NONE SEEN

## 2012-01-25 MED ORDER — METRONIDAZOLE 500 MG PO TABS: 2000.0000 mg | ORAL_TABLET | Freq: Three times a day (TID) | ORAL | Status: DC

## 2012-01-25 NOTE — ED Provider Notes (Signed)
History     CSN: [id number]  Arrival date & time 01/25/12  1042   First MD Initiated Contact with Patient 01/25/12 1112      No chief complaint on file.   (Consider location/radiation/quality/duration/timing/severity/associated sxs/prior treatment) HPI  52 y.o. female INAD c/o postmenopausal vaginal bleeding. Patient has not menstruated in 10 years. scant vaginal blood (has not used any pads) denies any abdominal pain, weakness, shortness of breath, palpitations. Patient has not had an OB/GYN exam in several years however she was tested for STDs 1 month ago and all results returned negative.  No past medical history on file.  No past surgical history on file.  No family history on file.  History  Substance Use Topics  . Smoking status: Current Every Day Smoker  . Smokeless tobacco: Not on file  . Alcohol Use: No    OB History    Grav Para Term Preterm Abortions TAB SAB Ect Mult Living                  Review of Systems  Constitutional: Negative for fever.  Respiratory: Negative for shortness of breath.   Cardiovascular: Negative for chest pain.  Gastrointestinal: Negative for nausea, vomiting, abdominal pain and diarrhea.  All other systems reviewed and are negative.    Allergies  Review of patient's allergies indicates no known allergies.  Home Medications   Current Outpatient Rx  Name Route Sig Dispense Refill  . FERROUS SULFATE 325 (65 FE) MG PO TABS Oral Take 325 mg by mouth daily with breakfast.      BP 122/83  Pulse 81  Temp 98.3 F (36.8 C) (Oral)  Resp 16  SpO2 100%  Physical Exam  Nursing note and vitals reviewed. Constitutional: She is oriented to person, place, and time. She appears well-developed and well-nourished. No distress.  HENT:  Head: Normocephalic.  Eyes: Conjunctivae normal and EOM are normal.       Ruddy palpebral conjunctiva  Cardiovascular: Normal rate, regular rhythm, normal heart sounds and intact distal pulses.     Pulmonary/Chest: Effort normal. No stridor.  Abdominal: Soft. Bowel sounds are normal. She exhibits no distension and no mass. There is no tenderness. There is no rebound and no guarding.  Genitourinary: Pelvic exam was performed with patient supine. There is no rash, tenderness or lesion on the right labia. There is no rash or tenderness on the left labia. Cervix exhibits discharge. Cervix exhibits no motion tenderness and no friability. Right adnexum displays no mass, no tenderness and no fullness. Left adnexum displays no mass, no tenderness and no fullness.       Pelvic exam chaperoned by RN.   Cervical os closed, cervix is friable, Foul smelling discharge. Bright red blood from cervix after instrumentation for sample retrieval. No pooling in posterior fourchette.   Musculoskeletal: Normal range of motion.  Neurological: She is alert and oriented to person, place, and time.  Psychiatric: She has a normal mood and affect.    ED Course  Procedures (including critical care time)  Labs Reviewed  CBC WITH DIFFERENTIAL - Abnormal; Notable for the following:    Lymphs Abs 4.3 (*)     All other components within normal limits  WET PREP, GENITAL - Abnormal; Notable for the following:    Trich, Wet Prep FEW (*)     WBC, Wet Prep HPF POC FEW (*)     All other components within normal limits   No results found.   1. Unspecified disorder of  menstruation and other abnormal bleeding from female genital tract       MDM  Os closed and not anemic. I advised the patient it is critically important that she follows up in OB/GYN for an endometrial biopsy. Patient had to leave to work for results of wet prep were resulted I advised her I would call her with the information if anything was abnormal. I did not test her for gonorrhea and Chlamydia as she has recently been tested in the last 4 weeks.   Results of the wet prep showed trichomoniasis. I will: A prescription to [facility]  for 2 mg of metronidazole. We'll call the patient and informed her that she needs to be treated and that her sexual partners need to be treated in addition we'll recommend a full STD screen.        Wynetta Emery, PA-C 01/25/12 1401

## 2012-01-25 NOTE — ED Notes (Signed)
Cell [phone]

## 2012-01-25 NOTE — ED Notes (Signed)
States has not period x 10 years and yesterday she started  Having small cramps  Has no gyn dr

## 2012-01-28 NOTE — ED Provider Notes (Signed)
Medical screening examination/treatment/procedure(s) were performed by non-physician practitioner and as supervising physician I was immediately available for consultation/collaboration.  Artrell Lawless L Geoffery Aultman, MD 01/28/12 1037 

## 2012-10-20 ENCOUNTER — Encounter (HOSPITAL_COMMUNITY): Payer: Self-pay | Admitting: *Deleted

## 2012-10-20 ENCOUNTER — Emergency Department (HOSPITAL_COMMUNITY)
Admission: EM | Admit: 2012-10-20 | Discharge: 2012-10-20 | Disposition: A | Discharge status: Home / Self Care | Payer: Self-pay | Attending: Emergency Medicine | Admitting: Emergency Medicine

## 2012-10-20 DIAGNOSIS — H5789 Other specified disorders of eye and adnexa: Secondary | ICD-10-CM | POA: Insufficient documentation

## 2012-10-20 DIAGNOSIS — F17200 Nicotine dependence, unspecified, uncomplicated: Secondary | ICD-10-CM | POA: Insufficient documentation

## 2012-10-20 MED ORDER — TETRACAINE HCL 0.5 % OP SOLN
1.0000 [drp] | Freq: Once | OPHTHALMIC | Status: DC
  Filled 2012-10-20: qty 2

## 2012-10-20 NOTE — ED Notes (Signed)
Reports feeling a popping sensation to right eye and now having redness to her eye and feels pressure in her eye, denies changes in vision.

## 2012-10-20 NOTE — ED Provider Notes (Signed)
History     CSN: [id number]  Arrival date & time 10/20/12  1312   First MD Initiated Contact with Patient 10/20/12 1334      Chief Complaint  Patient presents with  . Eye Problem    (Consider location/radiation/quality/duration/timing/severity/associated sxs/prior treatment) HPI  Patient presents to the emergency department with chief complaint of eye redness.  Patient states that she was out shopping with her family, she turned her head quickly and had shooting pain that wrapped up from the back of her neck around her head with associated paresthesia radiating into the eye and the cheek.  Sensation lasted approximately 20 seconds and then resolved.  She states that she simultaneously felt something pop in the back of her eye.  She had some residual headache along with pain with movement of the eye.  She denies any changes in vision.  Her husband noticed that her eye was turning red and suggested she come in for evaluation.  She denies any itching, discharge from the eye.  Denies photophobia, phonophobia, UL throbbing, N/V, visual changes, stiff neck, neck pain, rash, or "thunderclap" onset.  Patient's pain with eye movement resolved prior to arriving at the ED.   History reviewed. No pertinent past medical history.  History reviewed. No pertinent past surgical history.  History reviewed. No pertinent family history.  History  Substance Use Topics  . Smoking status: Current Every Day Smoker  . Smokeless tobacco: Not on file  . Alcohol Use: No    OB History   Grav Para Term Preterm Abortions TAB SAB Ect Mult Living                  Review of Systems  Constitutional: Negative for fever and chills.  HENT: Negative for trouble swallowing.   Eyes: Positive for redness. Negative for photophobia, pain, discharge, itching and visual disturbance.  Respiratory: Negative for shortness of breath.   Cardiovascular: Negative for chest pain.  Gastrointestinal: Negative for nausea,  vomiting, abdominal pain, diarrhea and constipation.  Genitourinary: Negative for dysuria and hematuria.  Musculoskeletal: Negative for myalgias and arthralgias.  Skin: Negative for rash.  Neurological: Negative for numbness.  All other systems reviewed and are negative.    Allergies  Review of patient's allergies indicates no known allergies.  Home Medications   Current Outpatient Rx  Name  Route  Sig  Dispense  Refill  . Multiple Vitamins-Minerals (HAIR VITAMINS PO)   Oral   Take 1 tablet by mouth 2 (two) times daily.           BP 125/78  Pulse 94  Temp(Src) 98.8 F (37.1 C) (Oral)  Resp 16  Wt 165 lb (74.844 kg)  BMI 29.94 kg/m2  SpO2 98%  Physical Exam  Constitutional: She is oriented to person, place, and time. She appears well-developed and well-nourished. No distress.  HENT:  Head: Normocephalic and atraumatic.  Eyes: Pupils are equal, round, and reactive to light. Right conjunctiva is injected. Right conjunctiva has no hemorrhage. Left conjunctiva is not injected. Left conjunctiva has no hemorrhage. No scleral icterus. Right eye exhibits normal extraocular motion and no nystagmus. Left eye exhibits normal extraocular motion.  Fundoscopic exam:      The right eye shows no arteriolar narrowing, no AV nicking, no exudate and no hemorrhage.       The left eye shows no arteriolar narrowing, no AV nicking, no exudate and no hemorrhage.    Neck: Normal range of motion.  Cardiovascular: Normal rate, regular rhythm and  normal heart sounds.  Exam reveals no gallop and no friction rub.   No murmur heard. Pulmonary/Chest: Effort normal and breath sounds normal. No respiratory distress.  Abdominal: Soft. Bowel sounds are normal. She exhibits no distension and no mass. There is no tenderness. There is no guarding.  Neurological: She is alert and oriented to person, place, and time.  Skin: Skin is warm and dry. She is not diaphoretic.    ED Course  Procedures (including  critical care time)  Labs Reviewed - No data to display No results found.   1. Eye redness       MDM  4:39 PM BP 125/78  Pulse 94  Temp(Src) 98.8 F (37.1 C) (Oral)  Resp 16  Wt 165 lb (74.844 kg)  BMI 29.94 kg/m2  SpO2 98% Patient with resolution of sxs prior to arrival accept for residual redness. No visual deficits, no pain. No headache. Normal pressures and acuity. No pain with pupillary constriction. No abnormalities on eye exam. I do not suspect retinal detachment. vitreous hemorrhage, iritis glaucoma or other acute abnormality. I will d/c the patient to follow up with ophthalmology.        Arthor Captain, PA-C 10/20/12 1644

## 2012-10-23 NOTE — ED Provider Notes (Signed)
Medical screening examination/treatment/procedure(s) were performed by non-physician practitioner and as supervising physician I was immediately available for consultation/collaboration.   Taiven Greenley M Raelle Chambers, DO 10/23/12 1815 

## 2012-11-27 ENCOUNTER — Emergency Department (HOSPITAL_COMMUNITY)
Admission: EM | Admit: 2012-11-27 | Discharge: 2012-11-27 | Disposition: A | Discharge status: Home / Self Care | Payer: Self-pay | Attending: Emergency Medicine | Admitting: Emergency Medicine

## 2012-11-27 ENCOUNTER — Encounter (HOSPITAL_COMMUNITY): Payer: Self-pay | Admitting: Emergency Medicine

## 2012-11-27 DIAGNOSIS — H5710 Ocular pain, unspecified eye: Secondary | ICD-10-CM | POA: Insufficient documentation

## 2012-11-27 DIAGNOSIS — H5711 Ocular pain, right eye: Secondary | ICD-10-CM

## 2012-11-27 DIAGNOSIS — F17200 Nicotine dependence, unspecified, uncomplicated: Secondary | ICD-10-CM | POA: Insufficient documentation

## 2012-11-27 MED ORDER — TETRACAINE HCL 0.5 % OP SOLN
1.0000 [drp] | Freq: Once | OPHTHALMIC | Status: DC
  Filled 2012-11-27: qty 2

## 2012-11-27 MED ORDER — FLUORESCEIN SODIUM 1 MG OP STRP
1.0000 | ORAL_STRIP | Freq: Once | OPHTHALMIC | Status: DC
  Filled 2012-11-27: qty 1

## 2012-11-27 NOTE — ED Provider Notes (Signed)
CSN: [id number]     Arrival date & time 11/27/12  7829 History     First MD Initiated Contact with Patient 11/27/12 1024     Chief Complaint  Patient presents with  . Eye Pain   (Consider location/radiation/quality/duration/timing/severity/associated sxs/prior Treatment) HPI Comments: 52 y.o. Female complaining of recurrent foreign body sensation in right eye. Onset a month ago.  Pt was seen in ED 10/20/12 after feeling the "pop" described in the nurse's note and experienced eye redness at that time. She is not experiencing that now. On that ED visit, pt had negative eye exam and opthalmology follow up. Pt never did follow up. She states sx resolved, but still occasionally feels like there is "something in there." Denies fever, photophobia, EOM pain.   Patient is a 52 y.o. female presenting with eye pain.  Eye Pain Pertinent negatives include no chest pain, diaphoresis, fever, headaches, nausea, neck pain, numbness, rash, vomiting or weakness.    History reviewed. No pertinent past medical history. History reviewed. No pertinent past surgical history. No family history on file. History  Substance Use Topics  . Smoking status: Current Every Day Smoker  . Smokeless tobacco: Not on file  . Alcohol Use: No   OB History   Grav Para Term Preterm Abortions TAB SAB Ect Mult Living                 Review of Systems  Constitutional: Negative for fever and diaphoresis.  HENT: Negative for neck pain and neck stiffness.   Eyes: Negative for photophobia, pain, discharge, redness, itching and visual disturbance.       Foreign body sensation to right eye  Respiratory: Negative for apnea, chest tightness and shortness of breath.   Cardiovascular: Negative for chest pain and palpitations.  Gastrointestinal: Negative for nausea, vomiting, diarrhea and constipation.  Genitourinary: Negative for dysuria.  Musculoskeletal: Negative for gait problem.  Skin: Negative for rash.  Neurological:  Negative for dizziness, weakness, light-headedness, numbness and headaches.    Allergies  Review of patient's allergies indicates no known allergies.  Home Medications   Current Outpatient Rx  Name  Route  Sig  Dispense  Refill  . hydrocortisone cream 1 %   Topical   Apply 1 application topically daily as needed (for rash).         . Multiple Vitamins-Minerals (HAIR VITAMINS PO)   Oral   Take 2 tablets by mouth daily.           BP 125/84  Pulse 90  Temp(Src) 98.2 F (36.8 C)  Resp 18  SpO2 98% Physical Exam  Nursing note and vitals reviewed. Constitutional: She is oriented to person, place, and time. She appears well-developed and well-nourished. No distress.  HENT:  Head: Normocephalic and atraumatic.  Eyes: Conjunctivae, EOM and lids are normal. Pupils are equal, round, and reactive to light. No foreign bodies found. Right eye exhibits no discharge and no exudate. No foreign body present in the right eye. Left eye exhibits no discharge and no exudate. No foreign body present in the left eye. Right conjunctiva is not injected. Left conjunctiva is not injected.  Fundoscopic exam:      The right eye shows red reflex.       The left eye shows red reflex.  Slit lamp exam:      The right eye shows no corneal abrasion and no fluorescein uptake.       The left eye shows no corneal abrasion and no fluorescein uptake.  Neck: Normal range of motion. Neck supple.  No meningeal signs  Cardiovascular: Normal rate, regular rhythm and normal heart sounds.  Exam reveals no gallop and no friction rub.   No murmur heard. Pulmonary/Chest: Effort normal and breath sounds normal. No respiratory distress. She has no wheezes. She has no rales. She exhibits no tenderness.  Abdominal: Soft. Bowel sounds are normal. She exhibits no distension. There is no tenderness. There is no rebound and no guarding.  Musculoskeletal: Normal range of motion. She exhibits no edema and no tenderness.   Neurological: She is alert and oriented to person, place, and time. No cranial nerve deficit.  Skin: Skin is warm and dry. She is not diaphoretic. No erythema.    ED Course   Procedures (including critical care time)  Labs Reviewed - No data to display No results found. 1. Pain of right eye     MDM  The eye appears completely normal, no redness, no purulent discharge, not tender to palpation, no swelling of the lids or periorbital tissues. Conjunctiva is normal without any injection. Cornea intact to fluorescein staining. Normal anterior chamber and funduscopic exam. PERRLA, full EOMs. No pain with pupillary constriction.  I do not suspect retinal detachment. vitreous hemorrhage, iritis glaucoma or other acute abnormality.  Visual acuity was 20/30 in either eye and with both eyes open. EOP was 15 mmHg by tonometer. The patient states that she is seeing at baseline. She wears non-prescription reading glasses that she bought at the [employer]. She does not wear contact lenses.   Discussed the importance of following up with an ophthalmologist especially since the pt does not remember the last time she had a formal eye exam. Pt states she does understand importance.  At this time there does not appear to be any evidence of an acute emergency medical condition and the patient appears stable for discharge with follow up with ophthalmology.. Diagnosis was discussed with patient who verbalizes understanding and is agreeable to discharge.    Glade Nurse, PA-C 11/27/12 2220

## 2012-11-27 NOTE — ED Notes (Signed)
Pt. Stated, I felt something pop in my eye and now its feels all grity.

## 2012-11-28 NOTE — ED Provider Notes (Signed)
Medical screening examination/treatment/procedure(s) were performed by non-physician practitioner and as supervising physician I was immediately available for consultation/collaboration.  Doug Sou, MD 11/28/12 1021

## 2012-12-19 ENCOUNTER — Encounter: Payer: Self-pay | Admitting: Internal Medicine

## 2012-12-19 ENCOUNTER — Ambulatory Visit: Payer: No Typology Code available for payment source | Attending: Family Medicine | Admitting: Internal Medicine

## 2012-12-19 VITALS — BP 136/90 | HR 73 | Temp 98.9°F | Resp 16 | Ht 62.0 in | Wt 164.0 lb

## 2012-12-19 DIAGNOSIS — Z1231 Encounter for screening mammogram for malignant neoplasm of breast: Secondary | ICD-10-CM

## 2012-12-19 DIAGNOSIS — K029 Dental caries, unspecified: Secondary | ICD-10-CM | POA: Insufficient documentation

## 2012-12-19 DIAGNOSIS — F17200 Nicotine dependence, unspecified, uncomplicated: Secondary | ICD-10-CM

## 2012-12-19 DIAGNOSIS — Z0000 Encounter for general adult medical examination without abnormal findings: Secondary | ICD-10-CM

## 2012-12-19 NOTE — Progress Notes (Signed)
Patient Demographics  Julie Dawson, is a 52 y.o. female  WUJ:[id number]  NFA:[id number]  DOB - 1960-08-26  Chief Complaint  Patient presents with  . Establish Care    DENTAL REFERRAL        Subjective:   Julie Dawson today is here to establish primary care. Patient has a no significant past medical history, she is postmenopausal for the past 5 years. Only complaint is that she has dental care his and would like a dental referral. Patient has also has No headache, No chest pain, No abdominal pain,No Nausea, No new weakness tingling or numbness, No Cough or SOB.   Objective:    Filed Vitals:   12/19/12 1101  BP: 136/90  Pulse: 73  Temp: 98.9 F (37.2 C)  TempSrc: Oral  Resp: 16  Height: 5\' 2"  (1.575 m)  Weight: 164 lb (74.39 kg)  SpO2: 98%     ALLERGIES:  No Known Allergies  PAST MEDICAL HISTORY: History reviewed. No pertinent past medical history.  PAST SURGICAL HISTORY: Surgery for ectopic pregnancy  FAMILY HISTORY: Family History  Problem Relation Age of Onset  . Diabetes Mother   . Hypertension Mother   . Stroke Father   . Diabetes Father   . Diabetes Sister     MEDICATIONS AT HOME: Prior to Admission medications   Medication Sig Start Date End Date Taking? Authorizing Provider  Multiple Vitamins-Minerals (HAIR VITAMINS PO) Take 2 tablets by mouth daily.    Yes Historical Provider, MD  hydrocortisone cream 1 % Apply 1 application topically daily as needed (for rash).    Historical Provider, MD    SOCIAL HISTORY:   reports that she has been smoking Cigarettes.  She has been smoking about 1.00 pack per day. She does not have any smokeless tobacco history on file. She reports that she does not drink alcohol or use illicit drugs.  REVIEW OF SYSTEMS:  Constitutional:   No   Fevers, chills, fatigue.  HEENT:    No headaches, Sore throat,   Cardio-vascular: No chest pain,  Orthopnea, swelling in lower extremities, anasarca, palpitations  GI:   No abdominal pain, nausea, vomiting, diarrhea  Resp: No shortness of breath,  No coughing up of blood.No cough.No wheezing.  Skin:  no rash or lesions.  GU:  no dysuria, change in color of urine, no urgency or frequency.  No flank pain.  Musculoskeletal: No joint pain or swelling.  No decreased range of motion.  No back pain.  Psych: No change in mood or affect. No depression or anxiety.  No memory loss.   Exam  General appearance :Awake, alert, not in any distress. Speech Clear. Not toxic Looking HEENT: Atraumatic and Normocephalic, pupils equally reactive to light and accomodation Neck: supple, no JVD. No cervical lymphadenopathy.  Chest:Good air entry bilaterally, no added sounds  CVS: S1 S2 regular, no murmurs.  Abdomen: Bowel sounds present, Non tender and not distended with no gaurding, rigidity or rebound. Extremities: B/L Lower Ext shows no edema, both legs are warm to touch Neurology: Awake alert, and oriented X 3, CN II-XII intact, Non focal Skin:No Rash Wounds:N/A   Data Review   CBC No results found for this basename: WBC, HGB, HCT, PLT, MCV, MCH, MCHC, RDW, NEUTRABS, LYMPHSABS, MONOABS, EOSABS, BASOSABS, BANDABS, BANDSABD,  in the last 168 hours  Chemistries   No results found for this basename: NA, K, CL, CO2, GLUCOSE, BUN, CREATININE, GFRCGP, CALCIUM, MG, AST, ALT, ALKPHOS, BILITOT,  in the last 168 hours ------------------------------------------------------------------------------------------------------------------  No results found for this basename: HGBA1C,  in the last 72 hours ------------------------------------------------------------------------------------------------------------------ No results found for this basename: CHOL, HDL, LDLCALC, TRIG, CHOLHDL, LDLDIRECT,  in the last 72 hours ------------------------------------------------------------------------------------------------------------------ No results found for this basename: TSH,  T4TOTAL, FREET3, T3FREE, THYROIDAB,  in the last 72 hours ------------------------------------------------------------------------------------------------------------------ No results found for this basename: VITAMINB12, FOLATE, FERRITIN, TIBC, IRON, RETICCTPCT,  in the last 72 hours  Coagulation profile  No results found for this basename: INR, PROTIME,  in the last 168 hours    Assessment & Plan   Infected dental caries - Refer to dentistry- no fever/swelling-monitor off antibiotics for now  Tobacco abuse - Counseled regarding importance of quitting, not ready yet.  Prior history of crack cocaine use - Claims to be free of crack cocaine for the past 6 years  Gen. health maintenance - Refer to GI for colonoscopy - Mammography ordered - Refer to GYN for Pap smear - Check CBC, chemistries, TSH, lipid panel and A1c  Return to clinic in 6 weeks  The patient was given clear instructions to go to ER or return to medical center if symptoms don't improve, worsen or new problems develop. The patient verbalized understanding. The patient was told to call to get lab results if they haven't heard anything in the next week.

## 2012-12-19 NOTE — Progress Notes (Signed)
PT HERE TO ESTABLISH CARE NO MEDICAL HX NOTED NEED DENTAL REFERRAL FOR POOR HYGIENE FAMILY HX DIABETES. REQUESTING SCREENING SMOKER. WOULD LIKE MEDICATION TO QUIT BP 136/90

## 2013-01-08 ENCOUNTER — Encounter: Payer: Self-pay | Admitting: Obstetrics & Gynecology

## 2013-01-18 ENCOUNTER — Ambulatory Visit: Payer: No Typology Code available for payment source

## 2013-01-30 ENCOUNTER — Ambulatory Visit: Payer: No Typology Code available for payment source

## 2013-02-25 ENCOUNTER — Ambulatory Visit: Payer: No Typology Code available for payment source | Attending: Internal Medicine

## 2013-02-25 VITALS — BP 120/78 | HR 68 | Temp 98.2°F | Resp 17

## 2013-02-25 DIAGNOSIS — Z021 Encounter for pre-employment examination: Secondary | ICD-10-CM

## 2013-02-25 DIAGNOSIS — Z0289 Encounter for other administrative examinations: Secondary | ICD-10-CM

## 2013-02-25 NOTE — Progress Notes (Unsigned)
Patient here for TB test for employment

## 2013-02-27 ENCOUNTER — Ambulatory Visit: Payer: No Typology Code available for payment source | Attending: Internal Medicine

## 2013-02-27 ENCOUNTER — Ambulatory Visit (HOSPITAL_COMMUNITY)
Admission: RE | Admit: 2013-02-27 | Discharge: 2013-02-27 | Disposition: A | Discharge status: Home / Self Care | Payer: No Typology Code available for payment source | Source: Ambulatory Visit | Attending: Internal Medicine | Admitting: Internal Medicine

## 2013-02-27 VITALS — BP 126/78 | HR 68 | Temp 98.2°F | Resp 16

## 2013-02-27 DIAGNOSIS — R7611 Nonspecific reaction to tuberculin skin test without active tuberculosis: Secondary | ICD-10-CM | POA: Insufficient documentation

## 2013-02-27 DIAGNOSIS — R7989 Other specified abnormal findings of blood chemistry: Secondary | ICD-10-CM

## 2013-02-27 LAB — TB SKIN TEST
Induration: 3 mm
TB Skin Test: POSITIVE

## 2013-02-27 LAB — LIPID PANEL
Cholesterol: 210 mg/dL — ABNORMAL HIGH (ref 0–200)
LDL Cholesterol: 149 mg/dL — ABNORMAL HIGH (ref 0–99)
Total CHOL/HDL Ratio: 6 Ratio
Triglycerides: 132 mg/dL (ref <150)
VLDL: 26 mg/dL (ref 0–40)

## 2013-02-27 LAB — COMPLETE METABOLIC PANEL WITH GFR
AST: 14 U/L (ref 0–37)
Alkaline Phosphatase: 92 U/L (ref 39–117)
BUN: 16 mg/dL (ref 6–23)
Calcium: 9.5 mg/dL (ref 8.4–10.5)
Chloride: 108 mEq/L (ref 96–112)
GFR, Est Non African American: 59 mL/min — ABNORMAL LOW
Glucose, Bld: 112 mg/dL — ABNORMAL HIGH (ref 70–99)
Potassium: 4.4 mEq/L (ref 3.5–5.3)
Total Bilirubin: 0.5 mg/dL (ref 0.3–1.2)

## 2013-02-27 LAB — CBC WITH DIFFERENTIAL/PLATELET
Basophils Absolute: 0 10*3/uL (ref 0.0–0.1)
Basophils Relative: 0 % (ref 0–1)
Eosinophils Absolute: 0.1 10*3/uL (ref 0.0–0.7)
Eosinophils Relative: 1 % (ref 0–5)
HCT: 39 % (ref 36.0–46.0)
Hemoglobin: 13.2 g/dL (ref 12.0–15.0)
Lymphs Abs: 3.3 10*3/uL (ref 0.7–4.0)
MCH: 27.3 pg (ref 26.0–34.0)
MCHC: 33.8 g/dL (ref 30.0–36.0)
Monocytes Absolute: 0.5 10*3/uL (ref 0.1–1.0)
Monocytes Relative: 8 % (ref 3–12)
Neutro Abs: 2.7 10*3/uL (ref 1.7–7.7)
Neutrophils Relative %: 41 % — ABNORMAL LOW (ref 43–77)
RBC: 4.84 MIL/uL (ref 3.87–5.11)
RDW: 14.4 % (ref 11.5–15.5)

## 2013-02-27 LAB — TSH: TSH: 1.752 u[IU]/mL (ref 0.350–4.500)

## 2013-02-27 NOTE — Progress Notes (Unsigned)
Patient here for blood work and to have her PPD read States always tests positive for test Sent to [facility] for [facility] x[facility] and referred to infectious disease

## 2013-02-28 ENCOUNTER — Ambulatory Visit: Payer: No Typology Code available for payment source | Admitting: Internal Medicine

## 2013-03-01 ENCOUNTER — Encounter: Payer: No Typology Code available for payment source | Admitting: Obstetrics & Gynecology

## 2013-03-14 ENCOUNTER — Encounter: Payer: Self-pay | Admitting: Internal Medicine

## 2013-03-14 ENCOUNTER — Ambulatory Visit (INDEPENDENT_AMBULATORY_CARE_PROVIDER_SITE_OTHER): Payer: No Typology Code available for payment source | Admitting: Internal Medicine

## 2013-03-14 VITALS — Temp 98.2°F | Ht 62.5 in | Wt 165.0 lb

## 2013-03-14 DIAGNOSIS — R7611 Nonspecific reaction to tuberculin skin test without active tuberculosis: Secondary | ICD-10-CM

## 2013-03-14 NOTE — Progress Notes (Signed)
  Subjective:    Patient ID: Julie Dawson, female    DOB: [birth year], 52 y.o.   MRN: [id number]  HPI She comes in for evaluation as a new patient. She was seen at her primary physician's office for a screening exam prior to starting work as a Scientist, forensic. She had a positive PPD. She is sent here for evaluation.  She had a positive PPD in the past and has had BCG vaccination. She reports 10 years ago she took 9 months of INH. She had a chest x-ray which was negative for anything acute. She has no cough, no fever, no weight loss, no night sweats. She is tested negative for HIV this year. No complaints today.   Review of Systems  Constitutional: Negative for fever, chills and fatigue.  HENT: Negative for trouble swallowing.   Eyes: Negative for visual disturbance.  Respiratory: Negative for cough, chest tightness, shortness of breath and wheezing.   Cardiovascular: Negative for chest pain.  Gastrointestinal: Negative for diarrhea.  Allergic/Immunologic: Negative for immunocompromised state.  Neurological: Negative for dizziness.  Hematological: Negative for adenopathy.  Psychiatric/Behavioral: Negative for dysphoric mood.       Objective:   Physical Exam  Constitutional: She appears well-developed and well-nourished. No distress.  Eyes: No scleral icterus.  Pulmonary/Chest: Effort normal. No respiratory distress.  Neurological: She is alert.  Skin: No rash noted.  Psychiatric: She has a normal mood and affect. Her behavior is normal.          Assessment & Plan:

## 2013-03-14 NOTE — Assessment & Plan Note (Signed)
She has a known positive PPD and no indication to have her repeat a PPD. She did have BCG vaccine though unclear if positive PPD Reflects latent tuberculosis or the vaccination. Regardless, she has been treated with 9 months of INH about 10 years ago there've been no indication for retreatment. We did test her for HIV here and she is negative. Chest x-ray did not show any acute disease. For further screening she will only need periodic chest x-rays as needed for appropriate screening times. No indication for treatment.

## 2014-10-13 ENCOUNTER — Ambulatory Visit (INDEPENDENT_AMBULATORY_CARE_PROVIDER_SITE_OTHER): Payer: BC Managed Care – PPO | Admitting: Physician Assistant

## 2014-10-13 VITALS — BP 116/70 | HR 86 | Temp 98.8°F | Resp 18 | Ht 62.0 in | Wt 157.4 lb

## 2014-10-13 DIAGNOSIS — Z23 Encounter for immunization: Secondary | ICD-10-CM | POA: Diagnosis not present

## 2014-10-13 DIAGNOSIS — Z789 Other specified health status: Secondary | ICD-10-CM

## 2014-10-13 DIAGNOSIS — Z9229 Personal history of other drug therapy: Secondary | ICD-10-CM

## 2014-10-13 NOTE — Patient Instructions (Signed)

## 2014-10-13 NOTE — Progress Notes (Signed)
   Julie Dawson  MRN: [id number] DOB: Sep 13, 1960  Subjective:  Pt presents to clinic for vaccinations for [employer].  She goes to school in the fall.  She has no record of her childhood vaccinations.  She knows she needs a titer and a tetanus vaccination.    Patient Active Problem List   Diagnosis Date Noted  . 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 [place] SKIN 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 06/08/2010  . INSOMNIA 12/31/2009  . HERPES LABIALIS 01/21/2009  . ONYCHOMYCOSIS 07/29/2008  . HYPERCHOLESTEROLEMIA 07/29/2008  . DENTAL CARIES 04/21/2008  . OBESITY 03/13/2008  . TOBACCO ABUSE 03/13/2008  . MENOPAUSAL SYNDROME 03/13/2008  . HEADACHE 03/13/2008  . DRUG ABUSE, HX OF 03/13/2008    No current outpatient prescriptions on file prior to visit.   No current facility-administered medications on file prior to visit.    No Known Allergies  Review of Systems Objective:  BP 116/70 mmHg  Pulse 86  Temp(Src) 98.8 F (37.1 C) (Oral)  Resp 18  Ht _0  (1.575 m)  Wt 157 lb 6.4 oz (71.396 kg)  BMI 28.78 kg/m2  SpO2 98%  Physical Exam  Constitutional: She is oriented to person, place, and time and well-developed, well-nourished, and in no distress.  HENT:  Head: Normocephalic and atraumatic.  Right Ear: Hearing and external ear normal.  Left Ear: Hearing and external ear normal.  Eyes: Conjunctivae are normal.  Neck: Normal range of motion.  Pulmonary/Chest: Effort normal.  Neurological: She is alert and oriented to person, place, and time. Gait normal.  Skin: Skin is warm and dry.  Psychiatric: Mood, memory, affect and judgment normal.  Vitals reviewed.   Assessment and Plan :  History of MMR vaccination - Plan: Measles/Mumps/Rubella Immunity  Need for diphtheria-tetanus-pertussis (Tdap) vaccine - Plan: Tdap vaccine greater than or equal to 7yo IM - she will need to RTC in 1 month for her 3rd tetanus vaccine and at this time  Windell Hummingbird PA-C  [facility] 10/13/2014 10:55 AM

## 2014-10-14 LAB — MEASLES/MUMPS/RUBELLA IMMUNITY
MUMPS IGG: 187 [AU]/ml — AB (ref <9.00)
Rubella: 8.39 Index — ABNORMAL HIGH (ref <0.90)
Rubeola IgG: >300 AU/mL — ABNORMAL HIGH (ref <25.00)

## 2014-10-16 NOTE — Progress Notes (Signed)
  Medical screening examination/treatment/procedure(s) were performed by non-physician practitioner and as supervising physician I was immediately available for consultation/collaboration.     

## 2014-10-18 ENCOUNTER — Encounter: Payer: Self-pay | Admitting: Family Medicine

## 2015-05-22 ENCOUNTER — Emergency Department (HOSPITAL_COMMUNITY): Payer: BC Managed Care – PPO

## 2015-05-22 ENCOUNTER — Encounter (HOSPITAL_COMMUNITY): Payer: Self-pay | Admitting: Emergency Medicine

## 2015-05-22 ENCOUNTER — Emergency Department (HOSPITAL_COMMUNITY)
Admission: EM | Admit: 2015-05-22 | Discharge: 2015-05-22 | Disposition: A | Discharge status: Home / Self Care | Payer: BC Managed Care – PPO | Attending: Emergency Medicine | Admitting: Emergency Medicine

## 2015-05-22 DIAGNOSIS — F17210 Nicotine dependence, cigarettes, uncomplicated: Secondary | ICD-10-CM | POA: Diagnosis not present

## 2015-05-22 DIAGNOSIS — B9789 Other viral agents as the cause of diseases classified elsewhere: Secondary | ICD-10-CM

## 2015-05-22 DIAGNOSIS — R0789 Other chest pain: Secondary | ICD-10-CM

## 2015-05-22 DIAGNOSIS — J069 Acute upper respiratory infection, unspecified: Secondary | ICD-10-CM | POA: Diagnosis not present

## 2015-05-22 DIAGNOSIS — R079 Chest pain, unspecified: Secondary | ICD-10-CM | POA: Diagnosis present

## 2015-05-22 LAB — CBC
HCT: 41.2 % (ref 36.0–46.0)
Hemoglobin: 13.9 g/dL (ref 12.0–15.0)
MCH: 27.7 pg (ref 26.0–34.0)
MCHC: 33.7 g/dL (ref 30.0–36.0)
MCV: 82.2 fL (ref 78.0–100.0)
PLATELETS: 278 10*3/uL (ref 150–400)
RBC: 5.01 MIL/uL (ref 3.87–5.11)
RDW: 13 % (ref 11.5–15.5)
WBC: 10.4 10*3/uL (ref 4.0–10.5)

## 2015-05-22 LAB — I-STAT TROPONIN, ED: TROPONIN I, POC: 0 ng/mL (ref 0.00–0.08)

## 2015-05-22 LAB — BASIC METABOLIC PANEL
Anion gap: 9 (ref 5–15)
BUN: 16 mg/dL (ref 6–20)
CALCIUM: 9.7 mg/dL (ref 8.9–10.3)
CO2: 28 mmol/L (ref 22–32)
CREATININE: 1.2 mg/dL — AB (ref 0.44–1.00)
Chloride: 104 mmol/L (ref 101–111)
GFR, EST AFRICAN AMERICAN: 58 mL/min — AB (ref >60)
GFR, EST NON AFRICAN AMERICAN: 50 mL/min — AB (ref >60)
Glucose, Bld: 163 mg/dL — ABNORMAL HIGH (ref 65–99)
Potassium: 4.3 mmol/L (ref 3.5–5.1)
SODIUM: 141 mmol/L (ref 135–145)

## 2015-05-22 MED ORDER — DEXTROMETHORPHAN-GUAIFENESIN 10-100 MG/5ML PO LIQD: 10.0000 mL | Freq: Four times a day (QID) | ORAL | Status: DC | PRN

## 2015-05-22 MED ORDER — BENZONATATE 100 MG PO CAPS: 100.0000 mg | ORAL_CAPSULE | Freq: Three times a day (TID) | ORAL | Status: DC

## 2015-05-22 NOTE — ED Provider Notes (Signed)
CSN: [id number]     Arrival date & time 05/22/15  4098 History   First MD Initiated Contact with Patient 05/22/15 [phone]     Chief Complaint  Patient presents with  . Chest Pain  . URI     (Consider location/radiation/quality/duration/timing/severity/associated sxs/prior Treatment) Patient is a 55 y.o. female presenting with chest pain and URI. The history is provided by the patient.  Chest Pain Pain location:  Substernal area Pain quality: pressure   Pain radiates to:  Does not radiate Pain radiates to the back: no   Pain severity:  Mild Onset quality:  Gradual Duration:  1 day Timing:  Constant Progression:  Unchanged Chronicity:  New Context comment:  Heavy coughing last night preceding symptoms, ear pain, nasal congestion Relieved by:  Nothing Worsened by:  Nothing tried Ineffective treatments:  None tried Associated symptoms: cough   Associated symptoms: no abdominal pain, no fever, no lower extremity edema, no nausea, no shortness of breath and not vomiting   Risk factors: smoking (1ppd)   Risk factors: no diabetes mellitus, no high cholesterol, no hypertension and not obese   URI Presenting symptoms: cough   Presenting symptoms: no fever     History reviewed. No pertinent past medical history. History reviewed. No pertinent past surgical history. Family History  Problem Relation Age of Onset  . Diabetes Mother   . Hypertension Mother   . Stroke Father   . Diabetes Father   . Diabetes Sister   . Drug abuse Brother   . Alcohol abuse Sister    Social History  Substance Use Topics  . Smoking status: Current Every Day Smoker -- 1.00 packs/day    Types: Cigarettes  . Smokeless tobacco: Never Used     Comment: PT REQUESTING PATCH  . Alcohol Use: No   OB History    No data available     Review of Systems  Constitutional: Negative for fever.  Respiratory: Positive for cough. Negative for shortness of breath.   Cardiovascular: Positive for chest pain.   Gastrointestinal: Negative for nausea, vomiting and abdominal pain.  All other systems reviewed and are negative.     Allergies  Review of patient's allergies indicates no known allergies.  Home Medications   Prior to Admission medications   Not on File   BP 129/83 mmHg  Pulse 96  Temp(Src) 97.7 F (36.5 C) (Oral)  Resp 18  Ht  (1.6 m)  Wt 166 lb (75.297 kg)  BMI 29.41 kg/m2  SpO2 98% Physical Exam  Constitutional: She is oriented to person, place, and time. She appears well-developed and well-nourished. No distress.  HENT:  Head: Normocephalic.  Eyes: Conjunctivae are normal.  Neck: Neck supple. No tracheal deviation present.  Cardiovascular: Normal rate, regular rhythm and normal heart sounds.   Pulmonary/Chest: Effort normal and breath sounds normal. No respiratory distress. She has no wheezes. She has no rales. She exhibits tenderness (anterior reproducing symptoms ).  Abdominal: Soft. She exhibits no distension.  Neurological: She is alert and oriented to person, place, and time.  Skin: Skin is warm and dry.  Psychiatric: She has a normal mood and affect.  Vitals reviewed.   ED Course  Procedures (including critical care time) Labs Review Labs Reviewed  BASIC METABOLIC PANEL - Abnormal; Notable for the following:    Glucose, Bld 163 (*)    Creatinine, Ser 1.20 (*)    GFR calc non Af Amer 50 (*)    GFR calc Af Amer 58 (*)  All other components within normal limits  CBC  I-STAT TROPOININ, ED    Imaging Review Dg Chest 2 View  05/22/2015  CLINICAL DATA:  Acute chest pain, cough. EXAM: CHEST  2 VIEW COMPARISON:  February 27, 2013. FINDINGS: The heart size and mediastinal contours are within normal limits. Both lungs are clear. No pneumothorax or pleural effusion is noted. The visualized skeletal structures are unremarkable. IMPRESSION: No active cardiopulmonary disease. Electronically Signed   By: Lupita Raider, M.D.   On: 05/22/2015 09:54   I have  personally reviewed and evaluated these images and lab results as part of my medical decision-making.   EKG Interpretation   Date/Time:  Friday May 22 2015 09:07:29 EST Ventricular Rate:  96 PR Interval:  146 QRS Duration: 74 QT Interval:  340 QTC Calculation: 429 R Axis:   59 Text Interpretation:  Normal sinus rhythm Nonspecific T wave abnormality  Abnormal ECG No previous tracing Confirmed by Roi Jafari MD, Makaleigh Reinard ([id number]) on  05/22/2015 10:09:57 AM      MDM   Final diagnoses:  Viral URI with cough  Chest wall pain    55 y.o. female presents with diffuse pain over chest following increased coughing/nasal congestion in the last 2 days. No signs of respiratory distress, non-toxic appearing, CTAB, no concern for pneumonia with this clinical picture.CXR negative and normal screening labs, Heart score 2, doubt ACS with reproducible pain and other clear explanation. Pt discharged with likely viral cough which will be self limited in its course.     Lyndal Pulley, MD 05/22/15 [phone]

## 2015-05-22 NOTE — ED Notes (Signed)
Patient states has chest pressure that started this morning with coughing.   Patient states that it is constant.   Patient states she also has nasal congestion with green yield.   And has had chest congestion but not coughing up anything.   Patient states nausea with no vomiting.  Denies other symptoms.

## 2015-05-22 NOTE — Discharge Instructions (Signed)
Upper Respiratory Infection, Adult Most upper respiratory infections (URIs) are a viral infection of the air passages leading to the lungs. A URI affects the nose, throat, and upper air passages. The most common type of URI is nasopharyngitis and is typically referred to as "the common cold." URIs run their course and usually go away on their own. Most of the time, a URI does not require medical attention, but sometimes a bacterial infection in the upper airways can follow a viral infection. This is called a secondary infection. Sinus and middle ear infections are common types of secondary upper respiratory infections. Bacterial pneumonia can also complicate a URI. A URI can worsen asthma and chronic obstructive pulmonary disease (COPD). Sometimes, these complications can require emergency medical care and may be life threatening.  CAUSES Almost all URIs are caused by viruses. A virus is a type of germ and can spread from one person to another.  RISKS FACTORS You may be at risk for a URI if:   You smoke.   You have chronic heart or lung disease.  You have a weakened defense (immune) system.   You are very young or very old.   You have nasal allergies or asthma.  You work in crowded or poorly ventilated areas.  You work in health care facilities or schools. SIGNS AND SYMPTOMS  Symptoms typically develop 2-3 days after you come in contact with a cold virus. Most viral URIs last 7-10 days. However, viral URIs from the influenza virus (flu virus) can last 14-18 days and are typically more severe. Symptoms may include:   Runny or stuffy (congested) nose.   Sneezing.   Cough.   Sore throat.   Headache.   Fatigue.   Fever.   Loss of appetite.   Pain in your forehead, behind your eyes, and over your cheekbones (sinus pain).  Muscle aches.  DIAGNOSIS  Your health care provider may diagnose a URI by:  Physical exam.  Tests to check that your symptoms are not due to  another condition such as:  Strep throat.  Sinusitis.  Pneumonia.  Asthma. TREATMENT  A URI goes away on its own with time. It cannot be cured with medicines, but medicines may be prescribed or recommended to relieve symptoms. Medicines may help:  Reduce your fever.  Reduce your cough.  Relieve nasal congestion. HOME CARE INSTRUCTIONS   Take medicines only as directed by your health care provider.   Gargle warm saltwater or take cough drops to comfort your throat as directed by your health care provider.  Use a warm mist humidifier or inhale steam from a shower to increase air moisture. This may make it easier to breathe.  Drink enough fluid to keep your urine clear or pale yellow.   Eat soups and other clear broths and maintain good nutrition.   Rest as needed.   Return to work when your temperature has returned to normal or as your health care provider advises. You may need to stay home longer to avoid infecting others. You can also use a face mask and careful hand washing to prevent spread of the virus.  Increase the usage of your inhaler if you have asthma.   Do not use any tobacco products, including cigarettes, chewing tobacco, or electronic cigarettes. If you need help quitting, ask your health care provider. PREVENTION  The best way to protect yourself from getting a cold is to practice good hygiene.   Avoid oral or hand contact with people with cold   symptoms.   Wash your hands often if contact occurs.  There is no clear evidence that vitamin C, vitamin E, echinacea, or exercise reduces the chance of developing a cold. However, it is always recommended to get plenty of rest, exercise, and practice good nutrition.  SEEK MEDICAL CARE IF:   You are getting worse rather than better.   Your symptoms are not controlled by medicine.   You have chills.  You have worsening shortness of breath.  You have brown or red mucus.  You have yellow or brown nasal  discharge.  You have pain in your face, especially when you bend forward.  You have a fever.  You have swollen neck glands.  You have pain while swallowing.  You have white areas in the back of your throat. SEEK IMMEDIATE MEDICAL CARE IF:   You have severe or persistent:  Headache.  Ear pain.  Sinus pain.  Chest pain.  You have chronic lung disease and any of the following:  Wheezing.  Prolonged cough.  Coughing up blood.  A change in your usual mucus.  You have a stiff neck.  You have changes in your:  Vision.  Hearing.  Thinking.  Mood. MAKE SURE YOU:   Understand these instructions.  Will watch your condition.  Will get help right away if you are not doing well or get worse.   This information is not intended to replace advice given to you by your health care provider. Make sure you discuss any questions you have with your health care provider.   Document Released: 10/12/2000 Document Revised: 09/02/2014 Document Reviewed: 07/24/2013 Elsevier Interactive Patient Education 2016 Elsevier Inc.  

## 2015-07-15 ENCOUNTER — Emergency Department (HOSPITAL_COMMUNITY): Payer: BC Managed Care – PPO

## 2015-07-15 ENCOUNTER — Emergency Department (HOSPITAL_COMMUNITY)
Admission: EM | Admit: 2015-07-15 | Discharge: 2015-07-15 | Disposition: A | Discharge status: Home / Self Care | Payer: BC Managed Care – PPO | Attending: Emergency Medicine | Admitting: Emergency Medicine

## 2015-07-15 ENCOUNTER — Encounter (HOSPITAL_COMMUNITY): Payer: Self-pay | Admitting: Emergency Medicine

## 2015-07-15 DIAGNOSIS — S20211A Contusion of right front wall of thorax, initial encounter: Secondary | ICD-10-CM | POA: Diagnosis not present

## 2015-07-15 DIAGNOSIS — S39012A Strain of muscle, fascia and tendon of lower back, initial encounter: Secondary | ICD-10-CM

## 2015-07-15 DIAGNOSIS — Y92410 Unspecified street and highway as the place of occurrence of the external cause: Secondary | ICD-10-CM | POA: Insufficient documentation

## 2015-07-15 DIAGNOSIS — F17210 Nicotine dependence, cigarettes, uncomplicated: Secondary | ICD-10-CM | POA: Diagnosis not present

## 2015-07-15 DIAGNOSIS — Y998 Other external cause status: Secondary | ICD-10-CM | POA: Diagnosis not present

## 2015-07-15 DIAGNOSIS — S4992XA Unspecified injury of left shoulder and upper arm, initial encounter: Secondary | ICD-10-CM | POA: Diagnosis present

## 2015-07-15 DIAGNOSIS — S46912A Strain of unspecified muscle, fascia and tendon at shoulder and upper arm level, left arm, initial encounter: Secondary | ICD-10-CM | POA: Insufficient documentation

## 2015-07-15 DIAGNOSIS — Y9389 Activity, other specified: Secondary | ICD-10-CM | POA: Insufficient documentation

## 2015-07-15 MED ORDER — NAPROXEN 500 MG PO TABS: 500.0000 mg | ORAL_TABLET | Freq: Two times a day (BID) | ORAL | Status: DC

## 2015-07-15 MED ORDER — KETOROLAC TROMETHAMINE 60 MG/2ML IM SOLN
60.0000 mg | Freq: Once | INTRAMUSCULAR | Status: AC
  Administered 2015-07-15: 60 mg via INTRAMUSCULAR

## 2015-07-15 MED ORDER — ORPHENADRINE CITRATE ER 100 MG PO TB12: 100.0000 mg | ORAL_TABLET | Freq: Two times a day (BID) | ORAL | Status: DC
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
## 2015-07-22 ENCOUNTER — Ambulatory Visit (INDEPENDENT_AMBULATORY_CARE_PROVIDER_SITE_OTHER): Payer: BC Managed Care – PPO | Admitting: Family Medicine

## 2015-07-22 VITALS — BP 122/60 | HR 72 | Temp 97.8°F | Resp 16 | Ht 63.39 in | Wt 163.0 lb

## 2015-07-22 DIAGNOSIS — M79662 Pain in left lower leg: Secondary | ICD-10-CM

## 2015-07-22 DIAGNOSIS — B001 Herpesviral vesicular dermatitis: Secondary | ICD-10-CM

## 2015-07-22 DIAGNOSIS — M25552 Pain in left hip: Secondary | ICD-10-CM

## 2015-07-22 MED ORDER — NAPROXEN 500 MG PO TABS: 500.0000 mg | ORAL_TABLET | Freq: Two times a day (BID) | ORAL | Status: DC

## 2015-07-22 MED ORDER — ACYCLOVIR 200 MG PO CAPS: 200.0000 mg | ORAL_CAPSULE | Freq: Every day | ORAL | Status: DC
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
## 2015-07-22 NOTE — Patient Instructions (Addendum)
We will call you to schedule your ultrasound Please try heat to your hip Avoid sitting or standing in one position for more than 20-30 minutes unless you are asleep    IF you received an x-ray today, you will receive an invoice from [facility]. Please contact [facility] at [phone] with questions or concerns regarding your invoice.   IF you received labwork today, you will receive an invoice from [facility]. Please contact [facility] at [phone] with questions or concerns regarding your invoice.   Our billing staff will not be able to assist you with questions regarding bills from these companies.  You will be contacted with the lab results as soon as they are available. The fastest way to get your results is to activate your My Chart account. Instructions are located on the last page of this paperwork. If you have not heard from us regarding the results in 2 weeks, please contact this office.

## 2015-07-27 ENCOUNTER — Ambulatory Visit (HOSPITAL_COMMUNITY)
Admission: RE | Admit: 2015-07-27 | Discharge: 2015-07-27 | Disposition: A | Discharge status: Home / Self Care | Payer: BC Managed Care – PPO | Source: Ambulatory Visit | Attending: Surgery | Admitting: Surgery

## 2015-07-27 DIAGNOSIS — M79662 Pain in left lower leg: Secondary | ICD-10-CM

## 2015-07-28 ENCOUNTER — Telehealth: Payer: Self-pay

## 2015-07-28 NOTE — Telephone Encounter (Signed)
The patient called to ask for the results of the vascular ultrasound from 07/27/15.  She also wants a referral to a specialist for the pain in her calves.  CB#: [phone]

## 2015-07-29 NOTE — Telephone Encounter (Signed)
Left message for pt to call back  °

## 2015-07-30 NOTE — Telephone Encounter (Signed)
Left message for pt to call back  °

## 2015-09-06 ENCOUNTER — Emergency Department (HOSPITAL_COMMUNITY)
Admission: EM | Admit: 2015-09-06 | Discharge: 2015-09-06 | Disposition: A | Discharge status: Home / Self Care | Payer: BC Managed Care – PPO | Attending: Emergency Medicine | Admitting: Emergency Medicine

## 2015-09-06 ENCOUNTER — Emergency Department (HOSPITAL_COMMUNITY): Payer: BC Managed Care – PPO

## 2015-09-06 ENCOUNTER — Encounter (HOSPITAL_COMMUNITY): Payer: Self-pay | Admitting: Family Medicine

## 2015-09-06 DIAGNOSIS — R51 Headache: Secondary | ICD-10-CM | POA: Insufficient documentation

## 2015-09-06 DIAGNOSIS — F17210 Nicotine dependence, cigarettes, uncomplicated: Secondary | ICD-10-CM | POA: Diagnosis not present

## 2015-09-06 DIAGNOSIS — R112 Nausea with vomiting, unspecified: Secondary | ICD-10-CM | POA: Diagnosis not present

## 2015-09-06 DIAGNOSIS — R519 Headache, unspecified: Secondary | ICD-10-CM

## 2015-09-06 DIAGNOSIS — R6883 Chills (without fever): Secondary | ICD-10-CM | POA: Diagnosis not present

## 2015-09-06 LAB — CBC WITH DIFFERENTIAL/PLATELET
BASOS ABS: 0 10*3/uL (ref 0.0–0.1)
BASOS PCT: 0 %
Eosinophils Absolute: 0.1 10*3/uL (ref 0.0–0.7)
Eosinophils Relative: 1 %
HEMATOCRIT: 36.8 % (ref 36.0–46.0)
Hemoglobin: 12 g/dL (ref 12.0–15.0)
LYMPHS PCT: 35 %
Lymphs Abs: 3.6 10*3/uL (ref 0.7–4.0)
MCH: 26.7 pg (ref 26.0–34.0)
MCHC: 32.6 g/dL (ref 30.0–36.0)
MCV: 82 fL (ref 78.0–100.0)
MONO ABS: 0.5 10*3/uL (ref 0.1–1.0)
Monocytes Relative: 5 %
NEUTROS ABS: 6 10*3/uL (ref 1.7–7.7)
Neutrophils Relative %: 59 %
PLATELETS: 239 10*3/uL (ref 150–400)
RBC: 4.49 MIL/uL (ref 3.87–5.11)
RDW: 13.3 % (ref 11.5–15.5)
WBC: 10.2 10*3/uL (ref 4.0–10.5)

## 2015-09-06 LAB — BASIC METABOLIC PANEL
ANION GAP: 12 (ref 5–15)
BUN: 23 mg/dL — ABNORMAL HIGH (ref 6–20)
CALCIUM: 9 mg/dL (ref 8.9–10.3)
CO2: 21 mmol/L — AB (ref 22–32)
CREATININE: 1.21 mg/dL — AB (ref 0.44–1.00)
Chloride: 106 mmol/L (ref 101–111)
GFR calc non Af Amer: 50 mL/min — ABNORMAL LOW (ref >60)
GFR, EST AFRICAN AMERICAN: 58 mL/min — AB (ref >60)
GLUCOSE: 173 mg/dL — AB (ref 65–99)
POTASSIUM: 3.8 mmol/L (ref 3.5–5.1)
Sodium: 139 mmol/L (ref 135–145)

## 2015-09-06 LAB — I-STAT CHEM 8, ED
BUN: 28 mg/dL — ABNORMAL HIGH (ref 6–20)
CREATININE: 1.2 mg/dL — AB (ref 0.44–1.00)
Calcium, Ion: 1.03 mmol/L — ABNORMAL LOW (ref 1.12–1.23)
Chloride: 107 mmol/L (ref 101–111)
GLUCOSE: 167 mg/dL — AB (ref 65–99)
HCT: 41 % (ref 36.0–46.0)
HEMOGLOBIN: 13.9 g/dL (ref 12.0–15.0)
Potassium: 4 mmol/L (ref 3.5–5.1)
Sodium: 139 mmol/L (ref 135–145)
TCO2: 22 mmol/L (ref 0–100)

## 2015-09-06 MED ORDER — METOCLOPRAMIDE HCL 5 MG/ML IJ SOLN
10.0000 mg | Freq: Once | INTRAMUSCULAR | Status: AC
Start: 2015-09-06 — End: 2015-09-06
  Administered 2015-09-06: 10 mg via INTRAVENOUS
  Filled 2015-09-06: qty 2

## 2015-09-06 MED ORDER — DIPHENHYDRAMINE HCL 50 MG/ML IJ SOLN
25.0000 mg | Freq: Once | INTRAMUSCULAR | Status: AC
  Administered 2015-09-06: 25 mg via INTRAVENOUS
  Filled 2015-09-06: qty 1

## 2015-09-06 NOTE — ED Notes (Signed)
Patient ambulatory from the waiting room at her request.  Patient stated that she took a nap this AM and when she woke up she had a headache that was behind her right ear and traveled down her neck to the back of her head and then to the top of her head.  Also stated her right eye was hurting her.  Vomited times one and stated that there was a lot of thick mucous

## 2015-09-06 NOTE — ED Notes (Signed)
Pt sts headache that started 2 hours ago with chills, nausea and vomiting.

## 2015-09-06 NOTE — ED Notes (Signed)
Patient returned from CT

## 2015-09-06 NOTE — ED Provider Notes (Signed)
CSN: [id number]     Arrival date & time 09/06/15  1748 History   First MD Initiated Contact with Patient 09/06/15 2057     Chief Complaint  Patient presents with  . Headache  . Chills  . Emesis     (Consider location/radiation/quality/duration/timing/severity/associated sxs/prior Treatment) Patient is a 55 y.o. female presenting with general illness. The history is provided by the patient.  Illness Location:  Headache Severity:  Moderate Onset quality:  Gradual Duration:  2 days Timing:  Intermittent Progression:  Waxing and waning Chronicity:  New Context:  No history of headaches. Not thunderclap in nature. Described as a dull aching pain coming and going. No vision changes. No fevers and chills. No neck stiffness. Associated symptoms: headaches, nausea and vomiting   Associated symptoms: no abdominal pain, no chest pain, no congestion, no diarrhea, no ear pain, no fever and no shortness of breath     History reviewed. No pertinent past medical history. History reviewed. No pertinent past surgical history. Family History  Problem Relation Age of Onset  . Diabetes Mother   . Hypertension Mother   . Stroke Father   . Diabetes Father   . Diabetes Sister   . Drug abuse Brother   . Alcohol abuse Sister    Social History  Substance Use Topics  . Smoking status: Current Every Day Smoker -- 1.00 packs/day    Types: Cigarettes  . Smokeless tobacco: Never Used     Comment: PT REQUESTING PATCH  . Alcohol Use: No   OB History    No data available     Review of Systems  Constitutional: Positive for chills. Negative for fever.  HENT: Negative for congestion, ear pain and sinus pressure.   Eyes: Negative for photophobia.  Respiratory: Negative for shortness of breath.   Cardiovascular: Negative for chest pain.  Gastrointestinal: Positive for nausea and vomiting. Negative for abdominal pain and diarrhea.  Genitourinary: Negative.  Negative for dysuria.  Musculoskeletal:  Negative for back pain, neck pain and neck stiffness.  Neurological: Positive for headaches. Negative for facial asymmetry, speech difficulty, weakness, light-headedness and numbness.  All other systems reviewed and are negative.     Allergies  Review of patient's allergies indicates no known allergies.  Home Medications   Prior to Admission medications   Medication Sig Start Date End Date Taking? Authorizing Provider  acyclovir (ZOVIRAX) 200 MG capsule Take 1 capsule (200 mg total) by mouth 5 (five) times daily. For up to 3 days for cold sore Patient not taking: Reported on 09/06/2015 07/22/15   Emi Belfast, FNP  benzonatate (TESSALON) 100 MG capsule Take 1 capsule (100 mg total) by mouth every 8 (eight) hours. Patient not taking: Reported on 07/15/2015 05/22/15   Lyndal Pulley, MD  dextromethorphan-guaiFENesin (TUSSIN DM) 10-100 MG/5ML liquid Take 10 mLs by mouth every 6 (six) hours as needed for cough. Patient not taking: Reported on 07/15/2015 05/22/15   Lyndal Pulley, MD  naproxen (NAPROSYN) 500 MG tablet Take 1 tablet (500 mg total) by mouth 2 (two) times daily. Patient not taking: Reported on 09/06/2015 07/22/15   Emi Belfast, FNP  orphenadrine (NORFLEX) 100 MG tablet Take 1 tablet (100 mg total) by mouth 2 (two) times daily. Patient not taking: Reported on 09/06/2015 07/15/15   Arby Barrette, MD   BP 110/59 mmHg  Pulse 82  Temp(Src) 98.1 F (36.7 C) (Oral)  Resp 18  SpO2 98% Physical Exam  Constitutional: She is oriented to person, place, and time. She  appears well-developed and well-nourished. No distress.  HENT:  Head: Normocephalic and atraumatic.  Mouth/Throat: Mucous membranes are not dry.  Eyes: EOM are normal. Pupils are equal, round, and reactive to light.  Neck: Normal range of motion. No rigidity. Normal range of motion present.  Cardiovascular: Normal rate and regular rhythm.   Pulmonary/Chest: No tachypnea. No respiratory distress.  Abdominal: Soft. Normal  appearance. There is no tenderness.  Neurological: She is alert and oriented to person, place, and time. She has normal strength. No cranial nerve deficit or sensory deficit. She displays a negative Romberg sign. Coordination normal. GCS eye subscore is 4. GCS verbal subscore is 5. GCS motor subscore is 6.  Skin: Skin is warm and dry.    ED Course  Procedures (including critical care time) Labs Review Labs Reviewed  BASIC METABOLIC PANEL - Abnormal; Notable for the following:    CO2 21 (*)    Glucose, Bld 173 (*)    BUN 23 (*)    Creatinine, Ser 1.21 (*)    GFR calc non Af Amer 50 (*)    GFR calc Af Amer 58 (*)    All other components within normal limits  I-STAT CHEM 8, ED - Abnormal; Notable for the following:    BUN 28 (*)    Creatinine, Ser 1.20 (*)    Glucose, Bld 167 (*)    Calcium, Ion 1.03 (*)    All other components within normal limits  CBC WITH DIFFERENTIAL/PLATELET    Imaging Review Ct Head Wo Contrast  09/06/2015  CLINICAL DATA:  Sudden onset headache 2 hours ago. EXAM: CT HEAD WITHOUT CONTRAST TECHNIQUE: Contiguous axial images were obtained from the base of the skull through the vertex without intravenous contrast. COMPARISON:  03/14/2008 FINDINGS: There is no intracranial hemorrhage, mass or evidence of acute infarction. There is no extra-axial fluid collection. Gray matter and white matter appear normal. Cerebral volume is normal for age. Brainstem and posterior fossa are unremarkable. The CSF spaces appear normal. The bony structures are intact. The visible portions of the paranasal sinuses are clear. The orbits are unremarkable. IMPRESSION: Normal brain Electronically Signed   By: Ellery Plunkaniel R Mitchell M.D.   On: 09/06/2015 22:46   I have personally reviewed and evaluated these images and lab results as part of my medical decision-making.   EKG Interpretation None      MDM   Final diagnoses:  Acute nonintractable headache, unspecified headache type    Most  likely diagnosis for the patient is a migraine. Present over the last 2 days. Not thunderclap in nature. Waxing and waning. No focal neurological deficits on exam. No nuchal rigidity. Otherwise well appearing. Vital signs normal. Afebrile, not tachycardic, not tachypneic, not hypoxic. Normotensive.  CT head without contrast showed no evidence of intracranial pathology such as mass or bleed. No leukocytosis, no anemia. Electrolytes within normal limits. Creatinine is 1.2; this is consistent with her baseline.  Gait the patient is a migraine cocktail with resolution of symptoms. Encouraged her to follow up with her primary care doctor in 1-2 days for reevaluation. Lower suspicion for subarachnoid hemorrhage at this time given the gradual onset of the headache. Patient does not appear meningitic and has no meningeal signs. Patient has no other systemic symptoms or localizing infectious symptoms to explain her headache.   Patient discharged in stable condition.   Lindalou HoseSean O'Rourke, MD 09/06/15 2317  Cathren LaineKevin Steinl, MD 09/07/15 [phone]

## 2015-09-06 NOTE — ED Notes (Signed)
Discharge instructions reviewed - voiced understanding 

## 2015-12-10 ENCOUNTER — Encounter: Payer: Self-pay | Admitting: Gastroenterology

## 2016-01-26 ENCOUNTER — Encounter (INDEPENDENT_AMBULATORY_CARE_PROVIDER_SITE_OTHER): Payer: Self-pay

## 2016-01-26 ENCOUNTER — Encounter: Payer: Self-pay | Admitting: Gastroenterology

## 2016-01-26 ENCOUNTER — Ambulatory Visit (AMBULATORY_SURGERY_CENTER): Payer: Self-pay | Admitting: *Deleted

## 2016-01-26 VITALS — Ht 62.5 in | Wt 161.0 lb

## 2016-01-26 DIAGNOSIS — Z1211 Encounter for screening for malignant neoplasm of colon: Secondary | ICD-10-CM

## 2016-01-26 MED ORDER — NA SULFATE-K SULFATE-MG SULF 17.5-3.13-1.6 GM/177ML PO SOLN: 1.0000 | Freq: Once | ORAL | 0 refills | Status: AC

## 2016-01-26 NOTE — Progress Notes (Signed)
No egg or soy allergy known to patient  No issues with past sedation with any surgeries  or procedures, no intubation problems  No diet pills per patient No home 02 use per patient  No blood thinners per patient  Pt states  issues with constipation - she states stools are not always hard but only has a stool every 2-3 days and when she goes it is a relief to her- will do 2 days prep as a result of this , marie PV  No A fib or A flutter  emmi to e mail

## 2016-02-09 ENCOUNTER — Encounter: Payer: BC Managed Care – PPO | Admitting: Gastroenterology

## 2016-05-23 ENCOUNTER — Encounter: Payer: Self-pay | Admitting: Nurse Practitioner

## 2016-05-23 ENCOUNTER — Other Ambulatory Visit (HOSPITAL_COMMUNITY)
Admission: RE | Admit: 2016-05-23 | Discharge: 2016-05-23 | Disposition: A | Discharge status: Home / Self Care | Payer: BC Managed Care – PPO | Source: Ambulatory Visit | Attending: Nurse Practitioner | Admitting: Nurse Practitioner

## 2016-05-23 ENCOUNTER — Ambulatory Visit (INDEPENDENT_AMBULATORY_CARE_PROVIDER_SITE_OTHER): Payer: BC Managed Care – PPO | Admitting: Nurse Practitioner

## 2016-05-23 ENCOUNTER — Other Ambulatory Visit: Payer: BC Managed Care – PPO

## 2016-05-23 VITALS — BP 138/80 | HR 92 | Temp 98.0°F | Ht 63.0 in | Wt 162.0 lb

## 2016-05-23 DIAGNOSIS — E7800 Pure hypercholesterolemia, unspecified: Secondary | ICD-10-CM

## 2016-05-23 DIAGNOSIS — Z01419 Encounter for gynecological examination (general) (routine) without abnormal findings: Secondary | ICD-10-CM | POA: Insufficient documentation

## 2016-05-23 DIAGNOSIS — Z124 Encounter for screening for malignant neoplasm of cervix: Secondary | ICD-10-CM | POA: Diagnosis not present

## 2016-05-23 DIAGNOSIS — Z0000 Encounter for general adult medical examination without abnormal findings: Secondary | ICD-10-CM

## 2016-05-23 DIAGNOSIS — Z1239 Encounter for other screening for malignant neoplasm of breast: Secondary | ICD-10-CM

## 2016-05-23 DIAGNOSIS — E118 Type 2 diabetes mellitus with unspecified complications: Secondary | ICD-10-CM

## 2016-05-23 DIAGNOSIS — Z1231 Encounter for screening mammogram for malignant neoplasm of breast: Secondary | ICD-10-CM

## 2016-05-23 DIAGNOSIS — Z1151 Encounter for screening for human papillomavirus (HPV): Secondary | ICD-10-CM | POA: Diagnosis present

## 2016-05-23 NOTE — Progress Notes (Signed)
Pre visit review using our clinic review tool, if applicable. No additional management support is needed unless otherwise documented below in the visit note. 

## 2016-05-23 NOTE — Progress Notes (Signed)
Subjective:    Patient ID: Julie Dawson, female    DOB: [birth year], 56 y.o.   MRN: [id number]  Patient presents today for complete physical and establish care (new patient)   HPI  Previous pcp with urgent care clinic at [facility].  Dm: No medication at this time. Xigduo and Januvia used in past. Caused excessive thirst and drowsiness, increased hunger.  Hyperlipidermia: Stopped taking lipitor 59monthago.  Immunizations: (TDAP, Hep C screen, Pneumovax, Influenza, zoster)  Health Maintenance  Topic Date Due  . Hemoglobin A1C  01962/03/06 .  Hepatitis C: One time screening is recommended by Center for Disease Control  (CDC) for  adults born from [birth year]through 1965.   004-28-1962 . Pneumococcal vaccine (1) 01/29/1963  . Eye exam for diabetics  01/29/1971  . Mammogram  01/07/2012  . Pap Smear  12/31/2012  . Flu Shot  07/30/2016*  . Colon Cancer Screening  05/02/2017*  . Complete foot exam   05/23/2017  . Tetanus Vaccine  10/12/2024  . HIV Screening  Completed  *Topic was postponed. The date shown is not the original due date.   Diet:regular Weight:  Wt Readings from Last 3 Encounters:  05/23/16 162 lb (73.5 kg)  01/26/16 161 lb (73 kg)  07/22/15 163 lb (73.9 kg)   Exercise:none Fall Risk: Fall Risk  05/23/2016 03/14/2013  Falls in the past year? No No   Home Safety:home with boyfriend Depression/Suicide: [facility] 05/23/2016 07/22/2015 10/13/2014 03/14/2013  Decreased Interest 0 0 0 0  Down, Depressed, Hopeless 0 0 0 0  PHQ - 2 Score 0 0 0 0   No flowsheet data found. Colonoscopy (every 5-162yr >50-7547yrneeded but declined Pap Smear (every 28yr4yrr >21-29 without HPV, every 24yrs31yr >30-624yrs78yr HPV): needed Mammogram (yearly, >424yrs)70yro date Vision:last done 03/2016 Dental:needed Advanced Directive: Advanced Directives 01/26/2016  Does Patient Have a Medical Advance Directive? No  Would patient like information on creating a medical  advance directive? -   Sexual History (birth control, marital status, STD):single, sexually active, unprotected  Medications and allergies reviewed with patient and updated if appropriate.  Patient Active Problem List   Diagnosis Date Noted  . 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 NONSPECDouglasEST W/O ACTIVE TB 06/08/2010  . INSOMNIA 12/31/2009  . HERPES LABIALIS 01/21/2009  . ONYCHOMYCOSIS 07/29/2008  . HYPERCHOLESTEROLEMIA 07/29/2008  . DENTAL CARIES 04/21/2008  . OBESITY 03/13/2008  . TOBACCO ABUSE 03/13/2008  . MENOPAUSAL SYNDROME 03/13/2008  . HEADACHE 03/13/2008  . DRUG ABUSE, HX OF 03/13/2008    No current outpatient prescriptions on file prior to visit.   No current facility-administered medications on file prior to visit.     Past Medical History:  Diagnosis Date  . Allergy    spring time only   . Constipation    doesnt use otc medicines  . Diabetes mellitus without complication (HCC)   Lapelistory of positive PPD    cannot do the tine test,  has to do CXR  . Hyperlipidemia     Past Surgical History:  Procedure Laterality Date  . cervical adenitis node remova    . ECTOPIC PREGNANCY SURGERY    . MOUTH SURGERY     for wisdom teeth removal    Social History   Social History  . Marital status: Single    Spouse name: N/A  . Number of children: N/A  . Years of education: N/A   Social History Main Topics  .  Smoking status: Current Every Day Smoker    Packs/day: 1.00    Types: Cigarettes  . Smokeless tobacco: Never Used     Comment: PT REQUESTING PATCH  . Alcohol use No  . 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  . Sexual activity: Yes    Birth control/ protection: Post-menopausal   Other Topics Concern  . None   Social History Narrative  . None    Family History  Problem Relation Age of Onset  . Diabetes Mother   . Hypertension Mother   . COPD Mother   . Stroke Father   . Diabetes Father   .  Hypertension Father   . Diabetes Sister   . Drug abuse Brother   . Alcohol abuse Sister   . Colon cancer Neg Hx   . Colon polyps Neg Hx   . Esophageal cancer Neg Hx   . Rectal cancer Neg Hx   . Stomach cancer Neg Hx         Review of Systems  Constitutional: Negative for fever, malaise/fatigue and weight loss.  HENT: Negative for congestion and sore throat.   Eyes:       Negative for visual changes  Respiratory: Negative for cough and shortness of breath.   Cardiovascular: Negative for chest pain, palpitations and leg swelling.  Gastrointestinal: Negative for abdominal pain, blood in stool, constipation, diarrhea, heartburn, nausea and vomiting.  Genitourinary: Negative for dysuria, frequency, hematuria and urgency.  Musculoskeletal: Negative for falls, joint pain and myalgias.  Skin: Negative for rash.  Neurological: Negative for dizziness, tingling, sensory change and headaches.  Endo/Heme/Allergies: Negative for polydipsia. Does not bruise/bleed easily.  Psychiatric/Behavioral: Negative for depression, substance abuse and suicidal ideas. The patient is not nervous/anxious.     Objective:   Vitals:   05/23/16 1359  BP: 138/80  Pulse: 92  Temp: 98 F (36.7 C)    Body mass index is 28.7 kg/m.   Physical Examination:  Physical Exam  Constitutional: She is oriented to person, place, and time and well-developed, well-nourished, and in no distress. No distress.  HENT:  Right Ear: Tympanic membrane, external ear and ear canal normal.  Left Ear: Tympanic membrane, external ear and ear canal normal.  Nose: Nose normal.  Mouth/Throat: No oropharyngeal exudate.  Eyes: Conjunctivae and EOM are normal. Pupils are equal, round, and reactive to light. No scleral icterus.  Neck: Normal range of motion. Neck supple. No thyromegaly present.  Cardiovascular: Normal rate, regular rhythm, normal heart sounds and intact distal pulses.   Pulmonary/Chest: Effort normal and breath  sounds normal. Right breast exhibits no mass, no nipple discharge and no skin change. Left breast exhibits no mass, no nipple discharge and no skin change. Breasts are symmetrical.  Abdominal: Soft. Bowel sounds are normal. She exhibits no distension. There is no tenderness.  Genitourinary: Rectum normal, vagina normal, cervix normal, right adnexa normal, left adnexa normal and vulva normal. Cervix exhibits no motion tenderness. No vaginal discharge found.  Musculoskeletal: Normal range of motion. She exhibits no edema or tenderness.  Lymphadenopathy:    She has no cervical adenopathy.  Neurological: She is alert and oriented to person, place, and time. Gait normal.  Skin: Skin is warm and dry.  Psychiatric: Affect and judgment normal.  Vitals reviewed.   ASSESSMENT and PLAN:  Everette was seen today for establish care.  Diagnoses and all orders for this visit:  Encounter for screening and preventative care -  CBC w/Diff; Future -     Comp Met (CMET); Future -     Lipid panel; Future -     TSH; Future -     Cytology - PAP; Future -     MM Digital Screening; Future  Breast cancer screening -     MM Digital Screening; Future  Pap smear for cervical cancer screening -     Cytology - PAP; Future  HYPERCHOLESTEROLEMIA -     Lipid panel; Future  Type 2 diabetes mellitus with complication, without long-term current use of insulin (HCC) -     Hemoglobin A1c; Future -     Ambulatory referral to Ophthalmology    No problem-specific Assessment & Plan notes found for this encounter.     Recent Results (from the past 2160 hour(s))  CBC w/Diff     Status: Abnormal   Collection Time: 05/24/16  9:11 AM  Result Value Ref Range   WBC 9.5 4.0 - 10.5 K/uL   RBC 4.98 3.87 - 5.11 Mil/uL   Hemoglobin 13.7 12.0 - 15.0 g/dL   HCT 40.5 36.0 - 46.0 %   MCV 81.2 78.0 - 100.0 fl   MCHC 33.9 30.0 - 36.0 g/dL   RDW 14.4 11.5 - 15.5 %   Platelets 255.0 150.0 - 400.0 K/uL   Neutrophils  Relative % 44.6 43.0 - 77.0 %   Lymphocytes Relative 47.6 (H) 12.0 - 46.0 %   Monocytes Relative 6.3 3.0 - 12.0 %   Eosinophils Relative 1.2 0.0 - 5.0 %   Basophils Relative 0.3 0.0 - 3.0 %   Neutro Abs 4.2 1.4 - 7.7 K/uL   Lymphs Abs 4.5 (H) 0.7 - 4.0 K/uL   Monocytes Absolute 0.6 0.1 - 1.0 K/uL   Eosinophils Absolute 0.1 0.0 - 0.7 K/uL   Basophils Absolute 0.0 0.0 - 0.1 K/uL  Comp Met (CMET)     Status: Abnormal   Collection Time: 05/24/16  9:11 AM  Result Value Ref Range   Sodium 138 135 - 145 mEq/L   Potassium 4.4 3.5 - 5.1 mEq/L   Chloride 107 96 - 112 mEq/L   CO2 26 19 - 32 mEq/L   Glucose, Bld 123 (H) 70 - 99 mg/dL   BUN 19 6 - 23 mg/dL   Creatinine, Ser 1.09 0.40 - 1.20 mg/dL   Total Bilirubin 0.6 0.2 - 1.2 mg/dL   Alkaline Phosphatase 92 39 - 117 U/L   AST 15 0 - 37 U/L   ALT 16 0 - 35 U/L   Total Protein 7.8 6.0 - 8.3 g/dL   Albumin 4.6 3.5 - 5.2 g/dL   Calcium 9.5 8.4 - 10.5 mg/dL   GFR 66.94 >60.00 mL/min  Lipid panel     Status: Abnormal   Collection Time: 05/24/16  9:11 AM  Result Value Ref Range   Cholesterol 247 (H) 0 - 200 mg/dL    Comment: ATP III Classification       Desirable:  < 200 mg/dL               Borderline High:  200 - 239 mg/dL          High:  > = 240 mg/dL   Triglycerides 141.0 0.0 - 149.0 mg/dL    Comment: Normal:  <150 mg/dLBorderline High:  150 - 199 mg/dL   HDL 40.90 >39.00 mg/dL   VLDL 28.2 0.0 - 40.0 mg/dL   LDL Cholesterol 178 (H) 0 - 99 mg/dL   Total  CHOL/HDL Ratio 6     Comment:                Men          Women1/2 Average Risk     3.4          3.3Average Risk          5.0          4.42X Average Risk          9.6          7.13X Average Risk          15.0          11.0                       NonHDL 206.37     Comment: NOTE:  Non-HDL goal should be 30 mg/dL higher than patient's LDL goal (i.e. LDL goal of < 70 mg/dL, would have non-HDL goal of < 100 mg/dL)  Hemoglobin A1c     Status: Abnormal   Collection Time: 05/24/16  9:11 AM  Result  Value Ref Range   Hgb A1c MFr Bld 6.8 (H) 4.6 - 6.5 %    Comment: Glycemic Control Guidelines for People with Diabetes:Non Diabetic:  <6%Goal of Therapy: <7%Additional Action Suggested:  >8%   TSH     Status: None   Collection Time: 05/24/16  9:11 AM  Result Value Ref Range   TSH 1.51 0.35 - 4.50 uIU/mL   Follow up: Return in about 3 months (around 08/21/2016) for DM and hyperlipidemia.  Wilfred Lacy, NP

## 2016-05-24 ENCOUNTER — Other Ambulatory Visit (INDEPENDENT_AMBULATORY_CARE_PROVIDER_SITE_OTHER): Payer: BC Managed Care – PPO

## 2016-05-24 DIAGNOSIS — E7800 Pure hypercholesterolemia, unspecified: Secondary | ICD-10-CM

## 2016-05-24 DIAGNOSIS — E118 Type 2 diabetes mellitus with unspecified complications: Secondary | ICD-10-CM

## 2016-05-24 DIAGNOSIS — Z0000 Encounter for general adult medical examination without abnormal findings: Secondary | ICD-10-CM | POA: Diagnosis not present

## 2016-05-24 LAB — COMPREHENSIVE METABOLIC PANEL
ALBUMIN: 4.6 g/dL (ref 3.5–5.2)
ALT: 16 U/L (ref 0–35)
AST: 15 U/L (ref 0–37)
Alkaline Phosphatase: 92 U/L (ref 39–117)
BUN: 19 mg/dL (ref 6–23)
CALCIUM: 9.5 mg/dL (ref 8.4–10.5)
CHLORIDE: 107 meq/L (ref 96–112)
CO2: 26 meq/L (ref 19–32)
Creatinine, Ser: 1.09 mg/dL (ref 0.40–1.20)
GFR: 66.94 mL/min (ref >60.00)
Glucose, Bld: 123 mg/dL — ABNORMAL HIGH (ref 70–99)
POTASSIUM: 4.4 meq/L (ref 3.5–5.1)
Sodium: 138 mEq/L (ref 135–145)
Total Bilirubin: 0.6 mg/dL (ref 0.2–1.2)
Total Protein: 7.8 g/dL (ref 6.0–8.3)

## 2016-05-24 LAB — LIPID PANEL
CHOL/HDL RATIO: 6
Cholesterol: 247 mg/dL — ABNORMAL HIGH (ref 0–200)
HDL: 40.9 mg/dL (ref >39.00)
LDL CALC: 178 mg/dL — AB (ref 0–99)
NonHDL: 206.37
Triglycerides: 141 mg/dL (ref 0.0–149.0)
VLDL: 28.2 mg/dL (ref 0.0–40.0)

## 2016-05-24 LAB — CBC WITH DIFFERENTIAL/PLATELET
BASOS PCT: 0.3 % (ref 0.0–3.0)
Basophils Absolute: 0 10*3/uL (ref 0.0–0.1)
EOS PCT: 1.2 % (ref 0.0–5.0)
Eosinophils Absolute: 0.1 10*3/uL (ref 0.0–0.7)
HEMATOCRIT: 40.5 % (ref 36.0–46.0)
HEMOGLOBIN: 13.7 g/dL (ref 12.0–15.0)
Lymphocytes Relative: 47.6 % — ABNORMAL HIGH (ref 12.0–46.0)
Lymphs Abs: 4.5 10*3/uL — ABNORMAL HIGH (ref 0.7–4.0)
MCHC: 33.9 g/dL (ref 30.0–36.0)
MCV: 81.2 fl (ref 78.0–100.0)
MONO ABS: 0.6 10*3/uL (ref 0.1–1.0)
Monocytes Relative: 6.3 % (ref 3.0–12.0)
NEUTROS ABS: 4.2 10*3/uL (ref 1.4–7.7)
Neutrophils Relative %: 44.6 % (ref 43.0–77.0)
PLATELETS: 255 10*3/uL (ref 150.0–400.0)
RBC: 4.98 Mil/uL (ref 3.87–5.11)
RDW: 14.4 % (ref 11.5–15.5)
WBC: 9.5 10*3/uL (ref 4.0–10.5)

## 2016-05-24 LAB — TSH: TSH: 1.51 u[IU]/mL (ref 0.35–4.50)

## 2016-05-24 LAB — HEMOGLOBIN A1C: Hgb A1c MFr Bld: 6.8 % — ABNORMAL HIGH (ref 4.6–6.5)

## 2016-05-25 LAB — CYTOLOGY - PAP
ADEQUACY: ABSENT
Diagnosis: NEGATIVE
HPV (WINDOPATH): NOT DETECTED

## 2016-05-25 MED ORDER — METFORMIN HCL ER 500 MG PO TB24: 500.0000 mg | ORAL_TABLET | Freq: Two times a day (BID) | ORAL | 3 refills | Status: DC

## 2016-05-25 MED ORDER — ATORVASTATIN CALCIUM 20 MG PO TABS: 20.0000 mg | ORAL_TABLET | Freq: Every day | ORAL | 3 refills | Status: DC

## 2016-05-25 MED ORDER — LISINOPRIL 2.5 MG PO TABS: 2.5000 mg | ORAL_TABLET | Freq: Every day | ORAL | 3 refills | Status: DC

## 2016-05-25 NOTE — Progress Notes (Signed)
Follow up in 3months (fasting)

## 2016-06-09 ENCOUNTER — Telehealth: Payer: Self-pay | Admitting: Nurse Practitioner

## 2016-06-09 NOTE — Telephone Encounter (Signed)
Patient Name: Julie Dawson DOB: [birth year] Initial Comment [name] states she recd Metformin and Licentoprel on 1/24. She is now bloated and gassy. She hasn't had a decent bowel movement since then either. Nurse Assessment Nurse: Helane RimaKareken, RN, Yehuda MaoJeanine Date/Time (Eastern Time): 06/09/2016 12:15:26 PM Confirm and document reason for call. If symptomatic, describe symptoms. ---[name] states that she has been bloated and gassy and not had a decent bowel movement ever since starting a few new medications on 1/24 Metformin, Lisinopril, and Atorvastatin for low kidney function per NP. Unsure if related. States she had no bowel movement from the 24th until this past Sunday. Per recommendation of pharmacist, she drank "Ballerina Tea" and started a probiotic. States she had a few bowel movements that day (Sunday) but none since then and back to feeling bloated and gassy. Does the patient have any new or worsening symptoms? ---Yes Will a triage be completed? ---Yes Related visit to physician within the last 2 weeks? ---Yes Does the PT have any chronic conditions? (i.e. diabetes, asthma, etc.) ---Yes List chronic conditions. ---low kidney function per NP, denies any other conditions Is this a behavioral health or substance abuse call? ---No Guidelines Guideline Title Affirmed Question Affirmed Notes Constipation Abdomen is more swollen than usual Final Disposition User See Physician within 24 Hours Helane RimaKareken, RN, Retail buyerJeanine Comments Charge nurse, Grier RocherMiranda Jones, to make appt for patient and document in Epic as this nurse does not have access. Patient states she can't go to an appt today as she is working, but can go tomorrow preferably between 9-11 am. Explained I would make a note of this and someone will call her back with an appt time. Contacted caller to schedule appt. She states she really is not able to come in today or tomorrow. She would like to be seen on Saturday. She will call back  tomorrow after lunch to see about making an appt in the Sat clinic. Referrals REFERRED TO PCP OFFICE Disagree/Comply: Comply

## 2016-06-10 ENCOUNTER — Ambulatory Visit (INDEPENDENT_AMBULATORY_CARE_PROVIDER_SITE_OTHER): Payer: BC Managed Care – PPO | Admitting: Internal Medicine

## 2016-06-10 ENCOUNTER — Encounter: Payer: Self-pay | Admitting: Internal Medicine

## 2016-06-10 ENCOUNTER — Encounter: Payer: Self-pay | Admitting: Family Medicine

## 2016-06-10 ENCOUNTER — Other Ambulatory Visit (INDEPENDENT_AMBULATORY_CARE_PROVIDER_SITE_OTHER): Payer: BC Managed Care – PPO

## 2016-06-10 VITALS — BP 108/74 | Temp 98.3°F | Wt 162.0 lb

## 2016-06-10 DIAGNOSIS — B009 Herpesviral infection, unspecified: Secondary | ICD-10-CM | POA: Diagnosis not present

## 2016-06-10 DIAGNOSIS — M25552 Pain in left hip: Secondary | ICD-10-CM

## 2016-06-10 DIAGNOSIS — R194 Change in bowel habit: Secondary | ICD-10-CM

## 2016-06-10 DIAGNOSIS — R109 Unspecified abdominal pain: Secondary | ICD-10-CM | POA: Diagnosis not present

## 2016-06-10 DIAGNOSIS — K5900 Constipation, unspecified: Secondary | ICD-10-CM | POA: Diagnosis not present

## 2016-06-10 LAB — URINALYSIS, ROUTINE W REFLEX MICROSCOPIC
Bilirubin Urine: NEGATIVE
HGB URINE DIPSTICK: NEGATIVE
Ketones, ur: NEGATIVE
LEUKOCYTES UA: NEGATIVE
NITRITE: NEGATIVE
Specific Gravity, Urine: 1.02 (ref 1.000–1.030)
Total Protein, Urine: NEGATIVE
URINE GLUCOSE: NEGATIVE
Urobilinogen, UA: 0.2 (ref 0.0–1.0)
pH: 5 (ref 5.0–8.0)

## 2016-06-10 NOTE — Progress Notes (Signed)
Chief Complaint  Patient presents with  . Left Side Back, Flank and Leg Pain    X1wk  . Constipation    HPI: Julie Dawson 56 y.o.  sda   PCP NA   She was recently placed on atorvastatin low-dose lisinopril and metformin to 500 mg a day. She comes in today for new symptoms that she really hasn't had before. She does have baseline irregular bowel movements called constipation but things have changed over the last week and then 3 days. Jan 26th Jan   Began meds and didn't have bm   Se of med  Gas and then  Last Saturday 5 days ago and had  Bad gas and bubbling   And took  Ballerina tea   And felt beter and "unloaded ":   And felt better and  Since Sunday . 5 days ago no bowel movement. She has also over the last 3 days had left upper quadrant flank comfort down to the buttocks and her leg groin lateral. No numbness or weakness. No injury. 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  remote hx of constipation   irreg   Pattern.   culturelle    No fever  Vomiting  And no blood seen.  Not taking anything for pain she describes the pain worsened throbbing.  ROS: See pertinent positives and negatives per HPI. No vaginal UTI symptoms blood. No vomiting fever or anorexia. No hematuria.  Past Medical History:  Diagnosis Date  . Allergy    spring time only   . Constipation    doesnt use otc medicines  . Diabetes mellitus without complication (HCC)   . History of positive PPD    cannot do the tine test,  has to do CXR  . Hyperlipidemia     Family History  Problem Relation Age of Onset  . Diabetes Mother   . Hypertension Mother   . COPD Mother   . Stroke Father   . Diabetes Father   . Hypertension Father   . Diabetes Sister   . Drug abuse Brother   . Alcohol abuse Sister   . Colon cancer Neg Hx   . Colon polyps Neg Hx   . Esophageal cancer Neg Hx   . Rectal cancer Neg Hx   . Stomach cancer Neg Hx     Social History   Social History  . Marital status: Single    Spouse name: N/A  . Number of  children: N/A  . Years of education: N/A   Social History Main Topics  . Smoking status: Current Every Day Smoker    Packs/day: 1.00    Types: Cigarettes  . Smokeless tobacco: Never Used     Comment: PT REQUESTING PATCH  . Alcohol use No  . 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  . Sexual activity: Yes    Birth control/ protection: Post-menopausal   Other Topics Concern  . None   Social History Narrative  . None    Outpatient Medications Prior to Visit  Medication Sig Dispense Refill  . atorvastatin (LIPITOR) 20 MG tablet Take 1 tablet (20 mg total) by mouth daily at 6 PM. 30 tablet 3  . lisinopril (PRINIVIL,ZESTRIL) 2.5 MG tablet Take 1 tablet (2.5 mg total) by mouth daily. 30 tablet 3  . metFORMIN (GLUCOPHAGE-XR) 500 MG 24 hr tablet Take 1 tablet (500 mg total) by mouth 2 (two) times daily after a meal. 60 tablet 3   No facility-administered  medications prior to visit.      EXAM:  BP 108/74 (BP Location: Right Arm, Patient Position: Sitting, Cuff Size: Normal)   Temp 98.3 F (36.8 C) (Oral)   Wt 162 lb (73.5 kg)   BMI 28.70 kg/m   Body mass index is 28.7 kg/m.  GENERAL: vitals reviewed and listed above, alert, oriented, appears well hydrated and in no acute distress HEENT: atraumatic, conjunctiva  clear, no obvious abnormalities on inspection of external nose and ears OP : no lesion edema or exudate  NECK: no obvious masses on inspection palpation  LUNGS: clear to auscultation bilaterally, no wheezes, rales or rhonchi, good air movement CV: HRRR, no clubbing cyanosis or  peripheral edema nl cap refill  Abdomen bowel sounds are quiet but present no hepatosplenomegaly but tenderness in the left upper quadrant not left lower negative psoas sign or perineal signs no masses felt. Left lateral side versus flank discomfort negative SLR or hip rotation pain. Neurologic appears grossly intact with DTRs and negative SLR. No acute rashes noted. MS:  moves all extremities without noticeable focal  abnormality but does favor the left side when she gets up from sitting. PSYCH: pleasant and cooperative, no obvious depression or anxiety Lab Results  Component Value Date   WBC 9.5 05/24/2016   HGB 13.7 05/24/2016   HCT 40.5 05/24/2016   PLT 255.0 05/24/2016   GLUCOSE 123 (H) 05/24/2016   CHOL 247 (H) 05/24/2016   TRIG 141.0 05/24/2016   HDL 40.90 05/24/2016   LDLCALC 178 (H) 05/24/2016   ALT 16 05/24/2016   AST 15 05/24/2016   NA 138 05/24/2016   K 4.4 05/24/2016   CL 107 05/24/2016   CREATININE 1.09 05/24/2016   BUN 19 05/24/2016   CO2 26 05/24/2016   TSH 1.51 05/24/2016   HGBA1C 6.8 (H) 05/24/2016    ASSESSMENT AND PLAN:  Discussed the following assessment and plan:  Change in bowel habits - Plan: Urinalysis, Routine w reflex microscopic, DG Abd Acute W/Chest  Constipation, unspecified constipation type - Plan: Urinalysis, Routine w reflex microscopic, DG Abd Acute W/Chest  Acute left flank pain - Plan: Urinalysis, Routine w reflex microscopic, DG Abd Acute W/Chest  Hip pain, left Not sure if these are 2 separate problems or not but worsening severe constipation timing with the onset of the medicines begun. Her side pain leg pounds sounds more mechanical although could be intra-abdominal referred. Her exam is otherwise reassuring. Has never had colon cancer screening up the no colonoscopy Decrease the metformin to 1 a day hold on atorvastatin for 1 week stand lisinopril get acute abdominal series and urinalysis. Make appointment with her PCP next week to review if x-ray okay will let her use MiraLAX over the weekend. -Patient advised to return or notify health care team  if symptoms worsen ,persist or new concerns arise.  Patient Instructions  Some of your symptoms sound like constipation that got worse and other sounds like back or hip problem that is a mechanical pain. However I agree since you're on medicine timing of  when her constipation got worse he could be aggravating the problem. In the short run decrease the metformin to once a day and stop the atorvastatin for 1 week. Stay on the lisinopril. We are checking a urinalysis today and a plain x-ray of your abdomen and chest. This is to make sure you don't have any kind of obstruction pattern. If okay I'm going to have you take MiraLAX one capful per day over  the weekend.  Could also try align instead of culturelle   Can take plain Tylenol for the back and leg pain in the short run. Make follow-up appointment visit  in about a week with your primary care office. If you're getting fever vomiting worsening of pain contact the emergency care team.    Neta Mends. Panosh M.D.

## 2016-06-10 NOTE — Patient Instructions (Addendum)
Some of your symptoms sound like constipation that got worse and other sounds like back or hip problem that is a mechanical pain. However I agree since you're on medicine timing of when her constipation got worse he could be aggravating the problem. In the short run decrease the metformin to once a day and stop the atorvastatin for 1 week. Stay on the lisinopril. We are checking a urinalysis today and a plain x-ray of your abdomen and chest. This is to make sure you don't have any kind of obstruction pattern. If okay I'm going to have you take MiraLAX one capful per day over the weekend.  Could also try align instead of culturelle   Can take plain Tylenol for the back and leg pain in the short run. Make follow-up appointment visit  in about a week with your primary care office. If you're getting fever vomiting worsening of pain contact the emergency care team.

## 2016-06-10 NOTE — Progress Notes (Signed)
Pre visit review using our clinic review tool, if applicable. No additional management support is needed unless otherwise documented below in the visit note. 

## 2016-06-13 ENCOUNTER — Telehealth: Payer: Self-pay | Admitting: Nurse Practitioner

## 2016-06-13 NOTE — Telephone Encounter (Signed)
Pt would like a call back about her lab results

## 2016-06-13 NOTE — Telephone Encounter (Signed)
Tried to reach the pt.  Received voicemail.  Will try again at a later time.

## 2016-06-15 ENCOUNTER — Ambulatory Visit (INDEPENDENT_AMBULATORY_CARE_PROVIDER_SITE_OTHER)
Admission: RE | Admit: 2016-06-15 | Discharge: 2016-06-15 | Disposition: A | Discharge status: Home / Self Care | Payer: BC Managed Care – PPO | Source: Ambulatory Visit | Attending: Internal Medicine | Admitting: Internal Medicine

## 2016-06-15 ENCOUNTER — Encounter: Payer: Self-pay | Admitting: Radiology

## 2016-06-15 DIAGNOSIS — R109 Unspecified abdominal pain: Secondary | ICD-10-CM | POA: Diagnosis not present

## 2016-06-15 DIAGNOSIS — K5900 Constipation, unspecified: Secondary | ICD-10-CM | POA: Diagnosis not present

## 2016-06-15 DIAGNOSIS — R194 Change in bowel habit: Secondary | ICD-10-CM

## 2016-06-17 ENCOUNTER — Ambulatory Visit: Payer: BC Managed Care – PPO | Admitting: Nurse Practitioner

## 2016-06-27 ENCOUNTER — Ambulatory Visit: Payer: BC Managed Care – PPO | Admitting: Nurse Practitioner

## 2016-06-27 ENCOUNTER — Telehealth: Payer: Self-pay | Admitting: Emergency Medicine

## 2016-06-27 NOTE — Telephone Encounter (Signed)
Pt came in for appt. Said she didn't want to be seen today and pay for visit. She left without being seen. Spoke with Provider and was told to no show appt.

## 2016-07-06 ENCOUNTER — Ambulatory Visit: Payer: BC Managed Care – PPO

## 2016-07-19 ENCOUNTER — Emergency Department (HOSPITAL_COMMUNITY)
Admission: EM | Admit: 2016-07-19 | Discharge: 2016-07-19 | Discharge status: Against Medical Advice | Payer: BC Managed Care – PPO | Attending: Emergency Medicine | Admitting: Emergency Medicine

## 2016-07-19 ENCOUNTER — Encounter (HOSPITAL_COMMUNITY): Payer: Self-pay | Admitting: Emergency Medicine

## 2016-07-19 DIAGNOSIS — R1030 Lower abdominal pain, unspecified: Secondary | ICD-10-CM

## 2016-07-19 DIAGNOSIS — E119 Type 2 diabetes mellitus without complications: Secondary | ICD-10-CM | POA: Insufficient documentation

## 2016-07-19 DIAGNOSIS — R1032 Left lower quadrant pain: Secondary | ICD-10-CM | POA: Insufficient documentation

## 2016-07-19 DIAGNOSIS — Z7984 Long term (current) use of oral hypoglycemic drugs: Secondary | ICD-10-CM | POA: Insufficient documentation

## 2016-07-19 DIAGNOSIS — F17210 Nicotine dependence, cigarettes, uncomplicated: Secondary | ICD-10-CM | POA: Insufficient documentation

## 2016-07-19 DIAGNOSIS — Z5320 Procedure and treatment not carried out because of patient's decision for unspecified reasons: Secondary | ICD-10-CM

## 2016-07-19 DIAGNOSIS — Z9119 Patient's noncompliance with other medical treatment and regimen: Secondary | ICD-10-CM

## 2016-07-19 DIAGNOSIS — M79605 Pain in left leg: Secondary | ICD-10-CM

## 2016-07-19 LAB — COMPREHENSIVE METABOLIC PANEL
ALBUMIN: 4.2 g/dL (ref 3.5–5.0)
ALT: 17 U/L (ref 14–54)
ANION GAP: 10 (ref 5–15)
AST: 18 U/L (ref 15–41)
Alkaline Phosphatase: 90 U/L (ref 38–126)
BILIRUBIN TOTAL: 0.6 mg/dL (ref 0.3–1.2)
BUN: 17 mg/dL (ref 6–20)
CHLORIDE: 106 mmol/L (ref 101–111)
CO2: 24 mmol/L (ref 22–32)
Calcium: 9.4 mg/dL (ref 8.9–10.3)
Creatinine, Ser: 1.23 mg/dL — ABNORMAL HIGH (ref 0.44–1.00)
GFR calc Af Amer: 56 mL/min — ABNORMAL LOW (ref >60)
GFR calc non Af Amer: 48 mL/min — ABNORMAL LOW (ref >60)
GLUCOSE: 169 mg/dL — AB (ref 65–99)
POTASSIUM: 4.3 mmol/L (ref 3.5–5.1)
SODIUM: 140 mmol/L (ref 135–145)
Total Protein: 7.3 g/dL (ref 6.5–8.1)

## 2016-07-19 LAB — CBC
HEMATOCRIT: 38.3 % (ref 36.0–46.0)
HEMOGLOBIN: 12.8 g/dL (ref 12.0–15.0)
MCH: 27.5 pg (ref 26.0–34.0)
MCHC: 33.4 g/dL (ref 30.0–36.0)
MCV: 82.2 fL (ref 78.0–100.0)
Platelets: 224 10*3/uL (ref 150–400)
RBC: 4.66 MIL/uL (ref 3.87–5.11)
RDW: 13.2 % (ref 11.5–15.5)
WBC: 9.8 10*3/uL (ref 4.0–10.5)

## 2016-07-19 LAB — LIPASE, BLOOD: LIPASE: 43 U/L (ref 11–51)

## 2016-07-19 NOTE — ED Notes (Signed)
Rounded on patient aware they are waiting on treatment room

## 2016-07-19 NOTE — ED Triage Notes (Signed)
Pt arrives via POv from home with lower abdominal/pelvic pain beginning this AM. Pt denies any bleeding/discharge/dysuria/fever. Pt also c/o left leg swelling. No significant swelling noted at this time. Pt ambulatory. VSS.

## 2016-07-19 NOTE — ED Provider Notes (Signed)
[facility]-EMERGENCY DEPT Provider Note   CSN: [id number] Arrival date & time: 07/19/16  [id number]     History   Chief Complaint Chief Complaint  Patient presents with  . Pelvic Pain  . Leg Swelling    HPI Julie Dawson is a 56 y.o. female.  She presents, with her "Julie Dawson",  today for evaluation of bilateral left lower quadrant abdominal pain that started this morning.  She states that the pain is cramping in nature and is currently an 8 out of 10.  She says that she feels like there is "pops" in her lower abdomen and that "it feels like the baby is kicking me." Patient states she is not sure if she is constipated last BM was this morning which she states was soft and formed.  No nausea, vomiting, diarrhea.  No shortness of breath  she reports that she has previously been seen for constipation and was instructed to use MiraLAX however she reports that she has not been taking MiraLAX and only occasionally using a stool softener.    Patient reports that she has not had a menstrual cycle in over 8 years and is up-to-date on Pap smears.  She reports that she is sexually active, does not use condoms.  She reports no abnormal discharge or vaginal bleeding.  She reports previous abdominal surgery for an ectopic pregnancy that she had "a long time ago" and says that she had a fallopian tube removed however is unsure which.  She reports no other abdominal surgeries.   She was concerned that she has been having left leg pain for over a year however she feels like her left foot swells up only when she has a shoe on.  She says that her left foot does not appear swollen only feels like it is.  No recent travel, no prior history of DVT.            Past Medical History:  Diagnosis Date  . Allergy    spring time only   . Constipation    doesnt use otc medicines  . Diabetes mellitus without complication (HCC)   . History of positive PPD    cannot do the tine test,  has to do CXR  . Hyperlipidemia      Patient Active Problem List   Diagnosis Date Noted  . 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 INSOMNIA 12/31/2009  . HERPES LABIALIS 01/21/2009  . ONYCHOMYCOSIS 07/29/2008  . HYPERCHOLESTEROLEMIA 07/29/2008  . DENTAL CARIES 04/21/2008  . OBESITY 03/13/2008  . TOBACCO ABUSE 03/13/2008  . MENOPAUSAL SYNDROME 03/13/2008  . HEADACHE 03/13/2008  . DRUG ABUSE, HX OF 03/13/2008    Past Surgical History:  Procedure Laterality Date  . cervical adenitis node remova    . ECTOPIC PREGNANCY SURGERY    . MOUTH SURGERY     for wisdom teeth removal    OB History    No data available       Home Medications    Prior to Admission medications   Medication Sig Start Date End Date Taking? Authorizing Provider  atorvastatin (LIPITOR) 20 MG tablet Take 1 tablet (20 mg total) by mouth daily at 6 PM. 05/25/16   Anne Ngharlotte Lum Nche, NP  lisinopril (PRINIVIL,ZESTRIL) 2.5 MG tablet Take 1 tablet (2.5 mg total) by mouth daily. 05/25/16   Anne Ngharlotte Lum Nche, NP  metFORMIN (GLUCOPHAGE-XR) 500 MG 24 hr tablet Take 1 tablet (500 mg total) by mouth 2 (  two) times daily after a meal. 05/25/16   Anne Ng, NP    Family History Family History  Problem Relation Age of Onset  . Diabetes Mother   . Hypertension Mother   . COPD Mother   . Stroke Father   . Diabetes Father   . Hypertension Father   . Diabetes Sister   . Drug abuse Brother   . Alcohol abuse Sister   . Colon cancer Neg Hx   . Colon polyps Neg Hx   . Esophageal cancer Neg Hx   . Rectal cancer Neg Hx   . Stomach cancer Neg Hx     Social History Social History  Substance Use Topics  . Smoking status: Current Every Day Smoker    Packs/day: 1.00    Types: Cigarettes  . Smokeless tobacco: Never Used     Comment: PT REQUESTING PATCH  . Alcohol use No     Allergies   Patient has no known allergies.   Review of Systems Review of Systems  Constitutional:  Negative for chills and fever.  HENT: Negative for ear pain and sore throat.   Eyes: Negative for pain and visual disturbance.  Respiratory: Negative for cough and shortness of breath.   Cardiovascular: Negative for chest pain and palpitations.  Gastrointestinal: Positive for abdominal distention and abdominal pain. Negative for blood in stool, constipation, diarrhea, nausea and vomiting.  Genitourinary: Positive for pelvic pain. Negative for dysuria, hematuria, vaginal bleeding and vaginal discharge.  Musculoskeletal: Negative for arthralgias and back pain.  Skin: Negative for color change and rash.  Neurological: Positive for numbness (Tingling in Left leg). Negative for dizziness, seizures, syncope, light-headedness and headaches.  All other systems reviewed and are negative.    Physical Exam Updated Vital Signs BP 111/66 (BP Location: Right Arm)   Pulse 87   Temp 97.3 F (36.3 C) (Oral)   Resp 20   SpO2 98%   Physical Exam  Constitutional: She appears well-developed and well-nourished. No distress.  HENT:  Head: Normocephalic and atraumatic.  Eyes: Conjunctivae are normal. No scleral icterus.  Neck: Normal range of motion.  Cardiovascular: Normal rate, regular rhythm and normal heart sounds.  Exam reveals no gallop and no friction rub.   No murmur heard. Pulmonary/Chest: Effort normal. No stridor.  Abdominal: Soft. Normal appearance and bowel sounds are normal. She exhibits no distension and no mass. There is tenderness in the right lower quadrant, suprapubic area and left lower quadrant. There is rebound and tenderness at McBurney's point. There is no rigidity, no guarding and negative Murphy's sign.    Musculoskeletal: She exhibits no edema, tenderness or deformity.  Neurological: She is alert.  Skin: Skin is warm and dry.  Psychiatric: She has a normal mood and affect. Her behavior is normal.  Nursing note and vitals reviewed.  Lower extremities are not swollen, no  edema, color or temperature change noted.  ED Treatments / Results  Labs (all labs ordered are listed, but only abnormal results are displayed) Labs Reviewed  COMPREHENSIVE METABOLIC PANEL - Abnormal; Notable for the following:       Result Value   Glucose, Bld 169 (*)    Creatinine, Ser 1.23 (*)    GFR calc non Af Amer 48 (*)    GFR calc Af Amer 56 (*)    All other components within normal limits  LIPASE, BLOOD  CBC  URINALYSIS, ROUTINE W REFLEX MICROSCOPIC    EKG  EKG Interpretation None  Radiology No results found.  Procedures Procedures (including critical care time)  Medications Ordered in ED Medications - No data to display   Initial Impression / Assessment and Plan / ED Course  I have reviewed the triage vital signs and the nursing notes.  Pertinent labs & imaging results that were available during my care of the patient were reviewed by me and considered in my medical decision making (see chart for details).  While finishing the physical exam patient asked how long the tests will take.  When informed patient said that her "Julie Dawson" has to go to work.  Patient stated that she would stay for work up.  Patient was offered pain medication but, informed me that she was 10 years sober and the joint informed decision was made not to order any narcotic pain relief.    I am slightly concerned she may have appendicitis based on physical exam tenderness at Mcburneys point and rebound tenderness, however patient appeared non toxic, was in no obvious distresses, afebrile and hemodynamically stable.  As her pain is bilateral lower quadrants GYN pathology can not be excluded.  CT scan planned.  Other probable possible diagnoses include, but are not limited to constipation, functional abdominal pain, and muscular pain.  Shortly after H and P obtained patient was seen walking out of the room, dressed, in no apparent distress, and left with out informing provider or staff.        Final Clinical Impressions(s) / ED Diagnoses   Final diagnoses:  Patient left before treatment completed  Lower abdominal pain  Left leg pain    New Prescriptions Discharge Medication List as of 07/19/2016  1:28 PM       Cristina Gong, PA 07/20/16 [id number]    Gerhard Munch, MD 07/20/16 [phone]

## 2016-08-22 ENCOUNTER — Ambulatory Visit (INDEPENDENT_AMBULATORY_CARE_PROVIDER_SITE_OTHER): Payer: BC Managed Care – PPO | Admitting: Nurse Practitioner

## 2016-08-22 ENCOUNTER — Encounter: Payer: Self-pay | Admitting: Nurse Practitioner

## 2016-08-22 VITALS — BP 110/72 | HR 75 | Temp 98.3°F | Ht 63.0 in | Wt 163.0 lb

## 2016-08-22 DIAGNOSIS — E7800 Pure hypercholesterolemia, unspecified: Secondary | ICD-10-CM | POA: Diagnosis not present

## 2016-08-22 DIAGNOSIS — E118 Type 2 diabetes mellitus with unspecified complications: Secondary | ICD-10-CM | POA: Diagnosis not present

## 2016-08-22 MED ORDER — ATORVASTATIN CALCIUM 20 MG PO TABS: 20.0000 mg | ORAL_TABLET | Freq: Every day | ORAL | 1 refills | Status: DC

## 2016-08-22 MED ORDER — LISINOPRIL 2.5 MG PO TABS: 2.5000 mg | ORAL_TABLET | Freq: Every day | ORAL | 1 refills | Status: DC

## 2016-08-22 NOTE — Progress Notes (Signed)
Subjective:  Patient ID: Julie Dawson, female    DOB: 09-Sep-1960  Age: 56 y.o. MRN: [id number]  CC: Follow-up (3 mo follow up/ metformin consult--pt report not taking it. )   HPI DM:  stopped taking metformin due to side effects (dry mouth, jittery and nausea). Lisinopril use for renal protection.  Hyperlipidemia: No adverse side effect with atorvastatin.  Tobacco use: No interest to quit at this time Smokes 1ppd.  Outpatient Medications Prior to Visit  Medication Sig Dispense Refill  . atorvastatin (LIPITOR) 20 MG tablet Take 1 tablet (20 mg total) by mouth daily at 6 PM. 30 tablet 3  . lisinopril (PRINIVIL,ZESTRIL) 2.5 MG tablet Take 1 tablet (2.5 mg total) by mouth daily. 30 tablet 3  . metFORMIN (GLUCOPHAGE-XR) 500 MG 24 hr tablet Take 1 tablet (500 mg total) by mouth 2 (two) times daily after a meal. (Patient not taking: Reported on 08/22/2016) 60 tablet 3   No facility-administered medications prior to visit.     ROS See HPI  Objective:  BP 110/72   Pulse 75   Temp 98.3 F (36.8 C)   Ht  (1.6 m)   Wt 163 lb (73.9 kg)   SpO2 98%   BMI 28.87 kg/m   BP Readings from Last 3 Encounters:  08/22/16 110/72  07/19/16 111/66  06/10/16 108/74    Wt Readings from Last 3 Encounters:  08/22/16 163 lb (73.9 kg)  06/10/16 162 lb (73.5 kg)  05/23/16 162 lb (73.5 kg)    Physical Exam  Constitutional: She is oriented to person, place, and time. No distress.  Neck: Normal range of motion. Neck supple.  Cardiovascular: Normal rate, regular rhythm and normal heart sounds.   Pulmonary/Chest: Effort normal and breath sounds normal. No respiratory distress.  Abdominal: Soft. She exhibits no distension.  Musculoskeletal: Normal range of motion. She exhibits no edema.  Lymphadenopathy:    She has no cervical adenopathy.  Neurological: She is alert and oriented to person, place, and time.  Skin: Skin is warm and dry.  Psychiatric: She has a normal mood and affect. Her  behavior is normal.  Vitals reviewed.   Lab Results  Component Value Date   WBC 9.8 07/19/2016   HGB 12.8 07/19/2016   HCT 38.3 07/19/2016   PLT 224 07/19/2016   GLUCOSE 169 (H) 07/19/2016   CHOL 247 (H) 05/24/2016   TRIG 141.0 05/24/2016   HDL 40.90 05/24/2016   LDLCALC 178 (H) 05/24/2016   ALT 17 07/19/2016   AST 18 07/19/2016   NA 140 07/19/2016   K 4.3 07/19/2016   CL 106 07/19/2016   CREATININE 1.23 (H) 07/19/2016   BUN 17 07/19/2016   CO2 24 07/19/2016   TSH 1.51 05/24/2016   HGBA1C 6.7 08/23/2016    No results found.  Assessment & Plan:   Gailene was seen today for follow-up.  Diagnoses and all orders for this visit:  Type 2 diabetes mellitus with complication, without long-term current use of insulin (HCC) -     POCT HgB A1C -     lisinopril (PRINIVIL,ZESTRIL) 2.5 MG tablet; Take 1 tablet (2.5 mg total) by mouth daily. -     glipiZIDE (GLUCOTROL XL) 2.5 MG 24 hr tablet; Take 1 tablet (2.5 mg total) by mouth daily with breakfast.  HYPERCHOLESTEROLEMIA -     atorvastatin (LIPITOR) 20 MG tablet; Take 1 tablet (20 mg total) by mouth daily at 6 PM.   I have discontinued Ms. Kading's metFORMIN. I am  also having her start on glipiZIDE. Additionally, I am having her maintain her lisinopril and atorvastatin.  Meds ordered this encounter  Medications  . lisinopril (PRINIVIL,ZESTRIL) 2.5 MG tablet    Sig: Take 1 tablet (2.5 mg total) by mouth daily.    Dispense:  90 tablet    Refill:  1    Order Specific Question:   Supervising Provider    Answer:   Tresa Garter [[id number]]  . atorvastatin (LIPITOR) 20 MG tablet    Sig: Take 1 tablet (20 mg total) by mouth daily at 6 PM.    Dispense:  90 tablet    Refill:  1    Order Specific Question:   Supervising Provider    Answer:   Tresa Garter [[id number]]  . glipiZIDE (GLUCOTROL XL) 2.5 MG 24 hr tablet    Sig: Take 1 tablet (2.5 mg total) by mouth daily with breakfast.    Dispense:  90 tablet    Refill:  1     Order Specific Question:   Supervising Provider    Answer:   Tresa Garter [[id number]]    Follow-up: Return in about 6 months (around 02/21/2017) for hyperlipidemia and DM (fasting for repeat A1c, BMP, and lipid panel)).  Alysia Penna, NP

## 2016-08-22 NOTE — Patient Instructions (Signed)
Please call [facility] (breast center) to schedule mammogram.  Call and reschedule opthalmology exam.

## 2016-08-22 NOTE — Progress Notes (Signed)
Pre visit review using our clinic review tool, if applicable. No additional management support is needed unless otherwise documented below in the visit note. 

## 2016-08-23 ENCOUNTER — Telehealth: Payer: Self-pay

## 2016-08-23 DIAGNOSIS — E118 Type 2 diabetes mellitus with unspecified complications: Secondary | ICD-10-CM | POA: Insufficient documentation

## 2016-08-23 LAB — POCT GLYCOSYLATED HEMOGLOBIN (HGB A1C): Hemoglobin A1C: 6.7

## 2016-08-23 MED ORDER — GLIPIZIDE ER 2.5 MG PO TB24: 2.5000 mg | ORAL_TABLET | Freq: Every day | ORAL | 1 refills | Status: DC

## 2016-08-23 NOTE — Telephone Encounter (Signed)
Routing to Julie Dawson, patient came back in for A1C check today, resulted at 6.7---I have updated yesterday's office visit/order with that result, thanks

## 2016-08-23 NOTE — Telephone Encounter (Signed)
error 

## 2016-08-23 NOTE — Assessment & Plan Note (Addendum)
HgbA1c of 6.7. Unable to tolerate metformin, januvia and xigduo (dapagliflozin/metformin) due to nausea, dry mouth, Jittery and lightheadedness. Start Glipizide 2.5mg .

## 2016-09-30 ENCOUNTER — Ambulatory Visit: Payer: BC Managed Care – PPO | Admitting: Nurse Practitioner

## 2016-10-04 ENCOUNTER — Ambulatory Visit (INDEPENDENT_AMBULATORY_CARE_PROVIDER_SITE_OTHER): Payer: BC Managed Care – PPO | Admitting: Nurse Practitioner

## 2016-10-04 ENCOUNTER — Encounter: Payer: Self-pay | Admitting: Nurse Practitioner

## 2016-10-04 VITALS — BP 120/74 | HR 90 | Temp 97.9°F | Ht 63.0 in | Wt 164.0 lb

## 2016-10-04 DIAGNOSIS — F17200 Nicotine dependence, unspecified, uncomplicated: Secondary | ICD-10-CM

## 2016-10-04 DIAGNOSIS — E118 Type 2 diabetes mellitus with unspecified complications: Secondary | ICD-10-CM | POA: Diagnosis not present

## 2016-10-04 NOTE — Patient Instructions (Addendum)
Hold use of glipizide at this time.  Continue healthy diet and exercise at this time.  Diabetes Mellitus and Food It is important for you to manage your blood sugar (glucose) level. Your blood glucose level can be greatly affected by what you eat. Eating healthier foods in the appropriate amounts throughout the day at about the same time each day will help you control your blood glucose level. It can also help slow or prevent worsening of your diabetes mellitus. Healthy eating may even help you improve the level of your blood pressure and reach or maintain a healthy weight. General recommendations for healthful eating and cooking habits include:  Eating meals and snacks regularly. Avoid going long periods of time without eating to lose weight.  Eating a diet that consists mainly of plant-based foods, such as fruits, vegetables, nuts, legumes, and whole grains.  Using low-heat cooking methods, such as baking, instead of high-heat cooking methods, such as deep frying.  Work with your dietitian to make sure you understand how to use the Nutrition Facts information on food labels. How can food affect me? Carbohydrates Carbohydrates affect your blood glucose level more than any other type of food. Your dietitian will help you determine how many carbohydrates to eat at each meal and teach you how to count carbohydrates. Counting carbohydrates is important to keep your blood glucose at a healthy level, especially if you are using insulin or taking certain medicines for diabetes mellitus. Alcohol Alcohol can cause sudden decreases in blood glucose (hypoglycemia), especially if you use insulin or take certain medicines for diabetes mellitus. Hypoglycemia can be a life-threatening condition. Symptoms of hypoglycemia (sleepiness, dizziness, and disorientation) are similar to symptoms of having too much alcohol. If your health care provider has given you approval to drink alcohol, do so in moderation and use  the following guidelines:  Women should not have more than one drink per day, and men should not have more than two drinks per day. One drink is equal to: ? 12 oz of beer. ? 5 oz of wine. ? 1 oz of hard liquor.  Do not drink on an empty stomach.  Keep yourself hydrated. Have water, diet soda, or unsweetened iced tea.  Regular soda, juice, and other mixers might contain a lot of carbohydrates and should be counted.  What foods are not recommended? As you make food choices, it is important to remember that all foods are not the same. Some foods have fewer nutrients per serving than other foods, even though they might have the same number of calories or carbohydrates. It is difficult to get your body what it needs when you eat foods with fewer nutrients. Examples of foods that you should avoid that are high in calories and carbohydrates but low in nutrients include:  Trans fats (most processed foods list trans fats on the Nutrition Facts label).  Regular soda.  Juice.  Candy.  Sweets, such as cake, pie, doughnuts, and cookies.  Fried foods.  What foods can I eat? Eat nutrient-rich foods, which will nourish your body and keep you healthy. The food you should eat also will depend on several factors, including:  The calories you need.  The medicines you take.  Your weight.  Your blood glucose level.  Your blood pressure level.  Your cholesterol level.  You should eat a variety of foods, including:  Protein. ? Lean cuts of meat. ? Proteins low in saturated fats, such as fish, egg whites, and beans. Avoid processed meats.  Fruits and vegetables. ? Fruits and vegetables that may help control blood glucose levels, such as apples, mangoes, and yams.  Dairy products. ? Choose fat-free or low-fat dairy products, such as milk, yogurt, and cheese.  Grains, bread, pasta, and rice. ? Choose whole grain products, such as multigrain bread, whole oats, and brown rice. These foods  may help control blood pressure.  Fats. ? Foods containing healthful fats, such as nuts, avocado, olive oil, canola oil, and fish.  Does everyone with diabetes mellitus have the same meal plan? Because every person with diabetes mellitus is different, there is not one meal plan that works for everyone. It is very important that you meet with a dietitian who will help you create a meal plan that is just right for you. This information is not intended to replace advice given to you by your health care provider. Make sure you discuss any questions you have with your health care provider. Document Released: 01/13/2005 Document Revised: 09/24/2015 Document Reviewed: 03/15/2013 Elsevier Interactive Patient Education  2017 [employer].

## 2016-10-04 NOTE — Assessment & Plan Note (Signed)
She does not want to take any medication at this time. Repeat HgbA1c in 3months

## 2016-10-04 NOTE — Assessment & Plan Note (Signed)
She is not ready to quit 

## 2016-10-04 NOTE — Progress Notes (Signed)
   Subjective:  Patient ID: Julie Dawson, female    DOB: 1960-09-03  Age: 56 y.o. MRN: [id number]  CC: Follow-up (1 mo fu/ med consult/not taking DM med--due to side effects/)  HPI Diabetes: Does not check glucose at home. Denies any hypoglycemic symptoms. Has not take any glucose lowering medication since last office visit. She will like to maintain and/lower hgbA1c with use of diet and exercise. She have maintain low carb /low salt diet. And daily exercise.  Outpatient Medications Prior to Visit  Medication Sig Dispense Refill  . atorvastatin (LIPITOR) 20 MG tablet Take 1 tablet (20 mg total) by mouth daily at 6 PM. 90 tablet 1  . lisinopril (PRINIVIL,ZESTRIL) 2.5 MG tablet Take 1 tablet (2.5 mg total) by mouth daily. 90 tablet 1  . glipiZIDE (GLUCOTROL XL) 2.5 MG 24 hr tablet Take 1 tablet (2.5 mg total) by mouth daily with breakfast. (Patient not taking: Reported on 10/04/2016) 90 tablet 1   No facility-administered medications prior to visit.     ROS See HPI  Objective:  BP 120/74   Pulse 90   Temp 97.9 F (36.6 C)   Ht 5\' 3"  (1.6 m)   Wt 164 lb (74.4 kg)   SpO2 99%   BMI 29.05 kg/m   BP Readings from Last 3 Encounters:  10/04/16 120/74  08/22/16 110/72  07/19/16 111/66    Wt Readings from Last 3 Encounters:  10/04/16 164 lb (74.4 kg)  08/22/16 163 lb (73.9 kg)  06/10/16 162 lb (73.5 kg)    Physical Exam  Constitutional: She is oriented to person, place, and time. No distress.  Neck: Normal range of motion. Neck supple.  Cardiovascular: Normal rate, regular rhythm and normal heart sounds.   Pulmonary/Chest: Effort normal and breath sounds normal.  Abdominal: Soft. Bowel sounds are normal.  Musculoskeletal: She exhibits no edema or tenderness.  Neurological: She is alert and oriented to person, place, and time.  Normal diabetic foot exam  Skin: Skin is warm and dry. No rash noted. No erythema.  Vitals reviewed.   Lab Results  Component Value Date   WBC  9.8 07/19/2016   HGB 12.8 07/19/2016   HCT 38.3 07/19/2016   PLT 224 07/19/2016   GLUCOSE 169 (H) 07/19/2016   CHOL 247 (H) 05/24/2016   TRIG 141.0 05/24/2016   HDL 40.90 05/24/2016   LDLCALC 178 (H) 05/24/2016   ALT 17 07/19/2016   AST 18 07/19/2016   NA 140 07/19/2016   K 4.3 07/19/2016   CL 106 07/19/2016   CREATININE 1.23 (H) 07/19/2016   BUN 17 07/19/2016   CO2 24 07/19/2016   TSH 1.51 05/24/2016   HGBA1C 6.7 08/23/2016    No results found.  Assessment & Plan:   Julie Dawson was seen today for follow-up.  Diagnoses and all orders for this visit:  Type 2 diabetes mellitus with complication, without long-term current use of insulin (HCC)  TOBACCO ABUSE   I have discontinued Julie Dawson's glipiZIDE. I am also having her maintain her lisinopril and atorvastatin.  No orders of the defined types were placed in this encounter.   Follow-up: Return in about 3 months (around 01/04/2017) for DM and HTN, hyperlipidemia (fasting).  Julie Pennaharlotte Wynton Hufstetler, NP

## 2016-11-17 ENCOUNTER — Telehealth: Payer: Self-pay | Admitting: Nurse Practitioner

## 2016-11-17 ENCOUNTER — Ambulatory Visit: Payer: BC Managed Care – PPO | Admitting: Nurse Practitioner

## 2016-11-17 NOTE — Telephone Encounter (Signed)
Pt came into the office stating she is applying for the job and they need TB test done. Pt has hx of positive TB, so she need a letter from the PCP to clear her (she had her x-ray in 06/2016). Please advise, call pt once the letter is done.

## 2016-11-17 NOTE — Telephone Encounter (Signed)
Letter printed and signed.  

## 2016-11-17 NOTE — Telephone Encounter (Signed)
Pt is a ware to pick the letter up. Place letter up front.

## 2016-11-27 ENCOUNTER — Emergency Department (HOSPITAL_COMMUNITY)
Admission: EM | Admit: 2016-11-27 | Discharge: 2016-11-27 | Disposition: A | Discharge status: Home / Self Care | Payer: BC Managed Care – PPO | Attending: Emergency Medicine | Admitting: Emergency Medicine

## 2016-11-27 DIAGNOSIS — G44039 Episodic paroxysmal hemicrania, not intractable: Secondary | ICD-10-CM

## 2016-11-27 DIAGNOSIS — E119 Type 2 diabetes mellitus without complications: Secondary | ICD-10-CM | POA: Insufficient documentation

## 2016-11-27 DIAGNOSIS — F17210 Nicotine dependence, cigarettes, uncomplicated: Secondary | ICD-10-CM | POA: Insufficient documentation

## 2016-11-27 DIAGNOSIS — N898 Other specified noninflammatory disorders of vagina: Secondary | ICD-10-CM | POA: Diagnosis not present

## 2016-11-27 DIAGNOSIS — Z79899 Other long term (current) drug therapy: Secondary | ICD-10-CM | POA: Diagnosis not present

## 2016-11-27 DIAGNOSIS — R51 Headache: Secondary | ICD-10-CM | POA: Diagnosis present

## 2016-11-27 DIAGNOSIS — R1032 Left lower quadrant pain: Secondary | ICD-10-CM | POA: Diagnosis not present

## 2016-11-27 LAB — BASIC METABOLIC PANEL
ANION GAP: 11 (ref 5–15)
BUN: 15 mg/dL (ref 6–20)
CO2: 22 mmol/L (ref 22–32)
CREATININE: 1.17 mg/dL — AB (ref 0.44–1.00)
Calcium: 9.1 mg/dL (ref 8.9–10.3)
Chloride: 104 mmol/L (ref 101–111)
GFR calc non Af Amer: 51 mL/min — ABNORMAL LOW (ref >60)
GFR, EST AFRICAN AMERICAN: 60 mL/min — AB (ref >60)
Glucose, Bld: 195 mg/dL — ABNORMAL HIGH (ref 65–99)
POTASSIUM: 4 mmol/L (ref 3.5–5.1)
Sodium: 137 mmol/L (ref 135–145)

## 2016-11-27 LAB — WET PREP, GENITAL
Clue Cells Wet Prep HPF POC: NONE SEEN
SPERM: NONE SEEN
Trich, Wet Prep: NONE SEEN
Yeast Wet Prep HPF POC: NONE SEEN

## 2016-11-27 LAB — URINALYSIS, ROUTINE W REFLEX MICROSCOPIC
Bilirubin Urine: NEGATIVE
GLUCOSE, UA: NEGATIVE mg/dL
Hgb urine dipstick: NEGATIVE
KETONES UR: NEGATIVE mg/dL
Nitrite: NEGATIVE
PH: 5 (ref 5.0–8.0)
Protein, ur: 30 mg/dL — AB
SPECIFIC GRAVITY, URINE: 1.015 (ref 1.005–1.030)

## 2016-11-27 LAB — CBG MONITORING, ED: Glucose-Capillary: 202 mg/dL — ABNORMAL HIGH (ref 65–99)

## 2016-11-27 LAB — CBC
HCT: 39.7 % (ref 36.0–46.0)
Hemoglobin: 13.5 g/dL (ref 12.0–15.0)
MCH: 27.3 pg (ref 26.0–34.0)
MCHC: 34 g/dL (ref 30.0–36.0)
MCV: 80.2 fL (ref 78.0–100.0)
Platelets: 258 10*3/uL (ref 150–400)
RBC: 4.95 MIL/uL (ref 3.87–5.11)
RDW: 13.3 % (ref 11.5–15.5)
WBC: 10.1 10*3/uL (ref 4.0–10.5)

## 2016-11-27 MED ORDER — LIDOCAINE HCL (PF) 1 % IJ SOLN
INTRAMUSCULAR | Status: AC
  Administered 2016-11-27: 1 mL
  Filled 2016-11-27: qty 5

## 2016-11-27 MED ORDER — AZITHROMYCIN 250 MG PO TABS
1000.0000 mg | ORAL_TABLET | Freq: Once | ORAL | Status: AC
  Administered 2016-11-27: 1000 mg via ORAL
  Filled 2016-11-27: qty 4

## 2016-11-27 MED ORDER — CEFTRIAXONE SODIUM 250 MG IJ SOLR
250.0000 mg | Freq: Once | INTRAMUSCULAR | Status: AC
  Administered 2016-11-27: 250 mg via INTRAMUSCULAR
  Filled 2016-11-27: qty 250

## 2016-11-27 NOTE — ED Provider Notes (Signed)
[facility]-EMERGENCY DEPT Provider Note   CSN: [id number] Arrival date & time: 11/27/16  1247   By signing my name below, I, Clarisse Gouge, attest that this documentation has been prepared under the direction and in the presence of Rolan Bucco, MD. Electronically signed, Clarisse Gouge, ED Scribe. 11/27/16. 2:06 PM.   History   Chief Complaint Chief Complaint  Patient presents with  . Flank Pain  . Vaginal Discharge  . Headache   The history is provided by the patient and medical records. No language interpreter was used.    Julie Dawson is a 56 y.o. female with h/o DM presenting to the Emergency Department concerning R sharp shooting temple pain since yesterday. Pt states this pain has resolved on evaluation. It only lasts a few seconds when it occurs.  She also c/o L flank pain, vaginal discharge that is malodorous and yellow.  She described 9/10, constant, flank area aches in triage. No numbness, weakness, speech difficulty, fever or N/V. No other complaints at this time. She is postmenopausal.  Past Medical History:  Diagnosis Date  . Allergy    spring time only   . Constipation    doesnt use otc medicines  . Diabetes mellitus without complication (HCC)   . History of positive PPD    cannot do the tine test,  has to do CXR  . Hyperlipidemia     Patient Active Problem List   Diagnosis Date Noted  . Type 2 diabetes mellitus with complication, without long-term current use of insulin (HCC) 08/23/2016  . 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 INSOMNIA 12/31/2009  . HERPES LABIALIS 01/21/2009  . ONYCHOMYCOSIS 07/29/2008  . HYPERCHOLESTEROLEMIA 07/29/2008  . DENTAL CARIES 04/21/2008  . OBESITY 03/13/2008  . TOBACCO ABUSE 03/13/2008  . MENOPAUSAL SYNDROME 03/13/2008  . HEADACHE 03/13/2008  . DRUG ABUSE, HX OF 03/13/2008    Past Surgical History:  Procedure Laterality Date  . cervical adenitis node remova    .  ECTOPIC PREGNANCY SURGERY    . MOUTH SURGERY     for wisdom teeth removal    OB History    No data available       Home Medications    Prior to Admission medications   Medication Sig Start Date End Date Taking? Authorizing Provider  atorvastatin (LIPITOR) 20 MG tablet Take 1 tablet (20 mg total) by mouth daily at 6 PM. 08/22/16   Nche, Bonna Gains, NP  lisinopril (PRINIVIL,ZESTRIL) 2.5 MG tablet Take 1 tablet (2.5 mg total) by mouth daily. 08/22/16   Nche, Bonna Gains, NP    Family History Family History  Problem Relation Age of Onset  . Diabetes Mother   . Hypertension Mother   . COPD Mother   . Stroke Father   . Diabetes Father   . Hypertension Father   . Diabetes Sister   . Drug abuse Brother   . Alcohol abuse Sister   . Colon cancer Neg Hx   . Colon polyps Neg Hx   . Esophageal cancer Neg Hx   . Rectal cancer Neg Hx   . Stomach cancer Neg Hx     Social History Social History  Substance Use Topics  . Smoking status: Current Every Day Smoker    Packs/day: 1.00    Types: Cigarettes  . Smokeless tobacco: Never Used     Comment: PT REQUESTING PATCH  . Alcohol use No     Allergies   Patient  has no known allergies.   Review of Systems Review of Systems  Constitutional: Negative for chills, diaphoresis and fever.  Respiratory: Negative for shortness of breath.   Gastrointestinal: Positive for abdominal pain. Negative for nausea and vomiting.  Genitourinary: Positive for flank pain and vaginal discharge.  Neurological: Positive for headaches. Negative for weakness and numbness.  All other systems reviewed and are negative.    Physical Exam Updated Vital Signs BP 123/81 (BP Location: Right Arm)   Pulse (!) 108   Temp 98 F (36.7 C) (Oral)   Resp 16   Ht 5' 3.5" (1.613 m)   Wt 166 lb (75.3 kg)   SpO2 98%   BMI 28.94 kg/m   Physical Exam  Constitutional: She is oriented to person, place, and time. She appears well-developed and well-nourished.    HENT:  Head: Normocephalic and atraumatic.  Neck: Normal range of motion. Neck supple.  Cardiovascular: Normal rate.   Pulmonary/Chest: Effort normal.  Abdominal: Soft. Normal appearance. There is tenderness. There is CVA tenderness.  Mild L mid abdomen and L flank pain  Genitourinary:  Genitourinary Comments: Patient has thick white discharge, no cervical motion tenderness or adnexal tenderness  Neurological: She is alert and oriented to person, place, and time.  Motor 5/5 all extremities Sensation grossly intact to LT all extremities Finger to Nose intact, no pronator drift CN II-XII grossly intact Gait normal   Skin: Skin is warm and dry.  Psychiatric: She has a normal mood and affect.     ED Treatments / Results  DIAGNOSTIC STUDIES: Oxygen Saturation is 98% on RA, NL by my interpretation.    COORDINATION OF CARE: 1:56 PM-Discussed next steps with pt. Pt verbalized understanding and is agreeable with the plan. Pt prepared for pelvic exam.   Labs (all labs ordered are listed, but only abnormal results are displayed) Labs Reviewed  WET PREP, GENITAL - Abnormal; Notable for the following:       Result Value   WBC, Wet Prep HPF POC MANY (*)    All other components within normal limits  URINALYSIS, ROUTINE W REFLEX MICROSCOPIC - Abnormal; Notable for the following:    APPearance TURBID (*)    Protein, ur 30 (*)    Leukocytes, UA LARGE (*)    Bacteria, UA MANY (*)    Squamous Epithelial / LPF 0-5 (*)    All other components within normal limits  BASIC METABOLIC PANEL - Abnormal; Notable for the following:    Glucose, Bld 195 (*)    Creatinine, Ser 1.17 (*)    GFR calc non Af Amer 51 (*)    GFR calc Af Amer 60 (*)    All other components within normal limits  CBG MONITORING, ED - Abnormal; Notable for the following:    Glucose-Capillary 202 (*)    All other components within normal limits  URINE CULTURE  CBC  RPR  HIV ANTIBODY (ROUTINE TESTING)  GC/CHLAMYDIA PROBE  AMP ([place]    EKG  EKG Interpretation None       Radiology No results found.  Procedures Procedures (including critical care time)  Medications Ordered in ED Medications  cefTRIAXone (ROCEPHIN) injection 250 mg (not administered)  azithromycin (ZITHROMAX) tablet 1,000 mg (not administered)     Initial Impression / Assessment and Plan / ED Course  I have reviewed the triage vital signs and the nursing notes.  Pertinent labs & imaging results that were available during my care of the patient were  reviewed by me and considered in my medical decision making (see chart for details).     Patient presents with some vaginal discharge associated with some mild left flank pain. There is no significant tenderness on exam. Her wet prep is negative. I will go ahead and treat her for STDs with Rocephin and Zithromax. Her urinalysis has white blood cells. I feel this is likely contamination from her vaginal infection. I encouraged her to follow-up with her PCP if her symptoms are not improving. Her urine was sent for culture. Return precautions were given. She does report intermittent sharp shooting pains in the right temporal area. There is no pain over the temporal artery. She's neurologically intact. She has no other strokelike symptoms. She's currently asymptomatic.  Final Clinical Impressions(s) / ED Diagnoses   Final diagnoses:  Vaginal discharge  Episodic paroxysmal hemicrania, not intractable    New Prescriptions New Prescriptions   No medications on file  I personally performed the services described in this documentation, which was scribed in my presence.  The recorded information has been reviewed and considered.    Rolan BuccoBelfi, Melodi Happel, MD 11/27/16 1556

## 2016-11-27 NOTE — ED Triage Notes (Signed)
Pt reports shooting right sided headache that started yesterday that comes and goes, no headache currently.  Pt also states she is prediabetic and was given 3 months to try lifestyle modifications. Pt thinks her sugars are still high. Pt reports left sided flank pain as well, denies urinary symptoms. Pt also reports vaginal discharge. Pt just ate lunch prior to obtaining CBG in triage.

## 2016-11-27 NOTE — ED Notes (Signed)
Patient refused to have vitals redone prior to discharge.  Patient ambulatory to exit with no apparent distress.  All belongings taken with patient to exit.  Discharges instructions given and understanding verbalized by patient.

## 2016-11-28 LAB — GC/CHLAMYDIA PROBE AMP (~~LOC~~) NOT AT ARMC
CHLAMYDIA, DNA PROBE: NEGATIVE
NEISSERIA GONORRHEA: NEGATIVE

## 2016-11-28 LAB — URINE CULTURE

## 2016-11-28 LAB — HIV ANTIBODY (ROUTINE TESTING W REFLEX): HIV Screen 4th Generation wRfx: NONREACTIVE

## 2016-11-28 LAB — RPR: RPR: NONREACTIVE

## 2016-12-06 ENCOUNTER — Ambulatory Visit: Payer: BC Managed Care – PPO

## 2017-01-16 IMAGING — DX DG CHEST 2V
2 series · 2 of 2 positions shown · non-contrast
Comparison: February 27, 2013.

CLINICAL DATA: Acute chest pain, cough.

EXAM:
CHEST  2 VIEW

[chest pa]
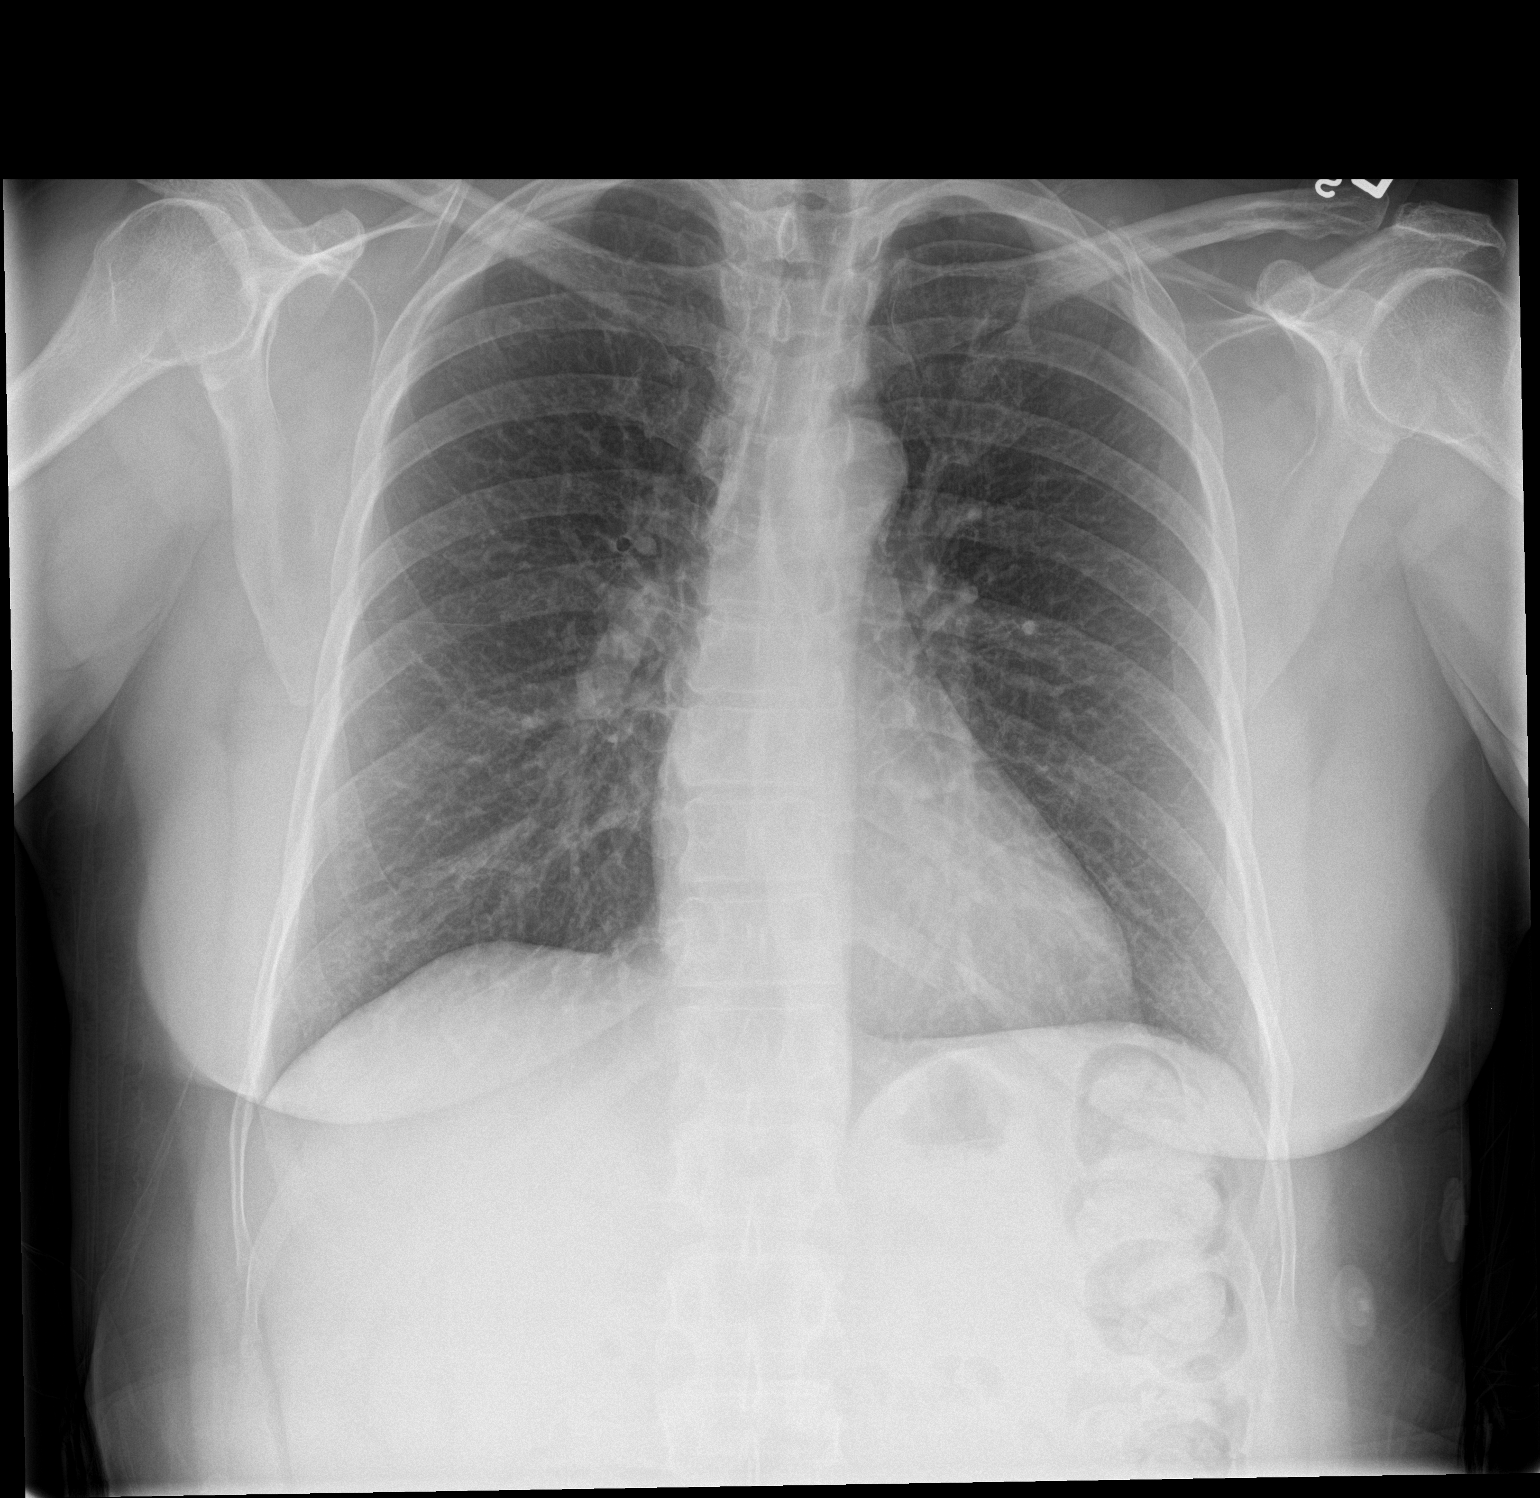

[chest lat]
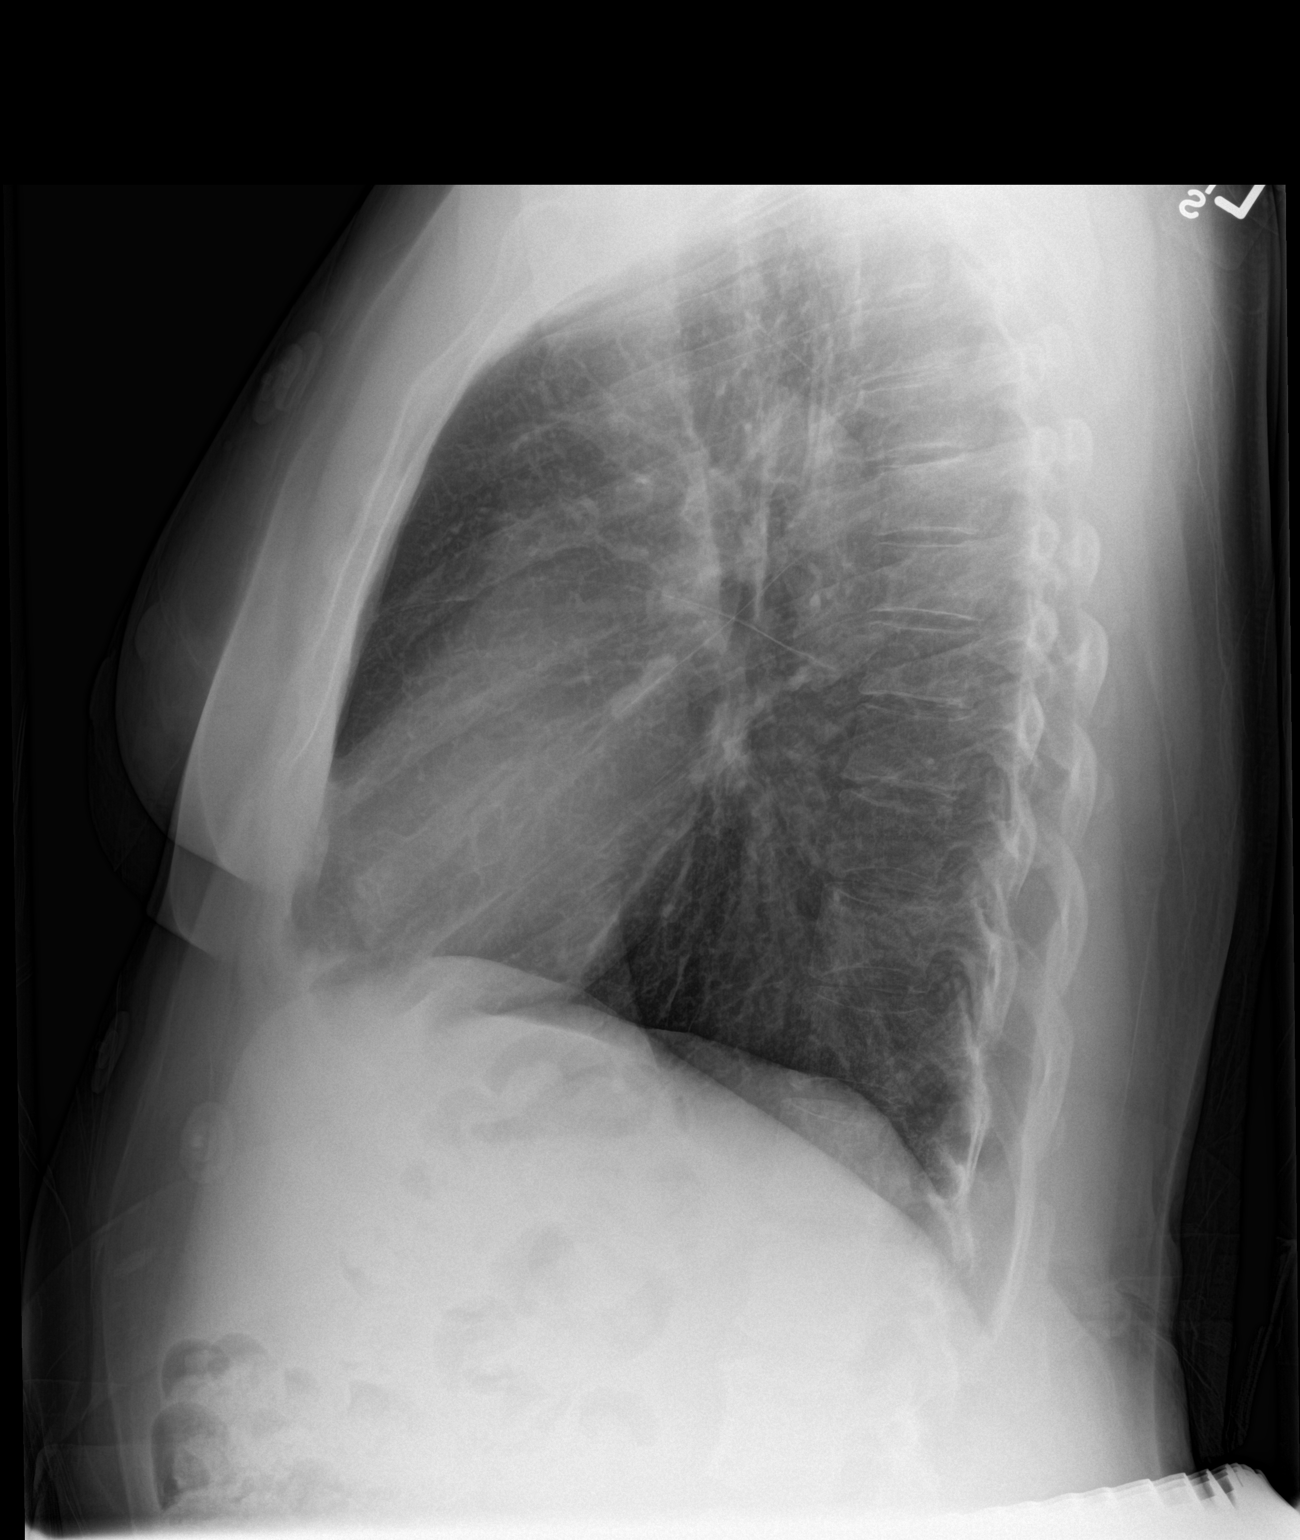

[2 of 2 positions shown; findings below may reference images not displayed]

FINDINGS: The heart size and mediastinal contours are within normal limits.
Both lungs are clear. No pneumothorax or pleural effusion is noted.
The visualized skeletal structures are unremarkable.
IMPRESSION: No active cardiopulmonary disease.

## 2017-01-24 ENCOUNTER — Encounter: Payer: Self-pay | Admitting: Nurse Practitioner

## 2017-01-24 ENCOUNTER — Ambulatory Visit (INDEPENDENT_AMBULATORY_CARE_PROVIDER_SITE_OTHER): Payer: BC Managed Care – PPO | Admitting: Nurse Practitioner

## 2017-01-24 VITALS — BP 126/82 | HR 92 | Temp 98.3°F | Ht 63.5 in | Wt 167.0 lb

## 2017-01-24 DIAGNOSIS — N760 Acute vaginitis: Secondary | ICD-10-CM | POA: Diagnosis not present

## 2017-01-24 DIAGNOSIS — E118 Type 2 diabetes mellitus with unspecified complications: Secondary | ICD-10-CM

## 2017-01-24 LAB — POCT GLYCOSYLATED HEMOGLOBIN (HGB A1C)

## 2017-01-24 MED ORDER — FLUCONAZOLE 150 MG PO TABS: 150.0000 mg | ORAL_TABLET | Freq: Once | ORAL | 0 refills | Status: AC

## 2017-01-24 MED ORDER — LISINOPRIL 2.5 MG PO TABS: 2.5000 mg | ORAL_TABLET | Freq: Every day | ORAL | 1 refills | Status: DC

## 2017-01-24 MED ORDER — GLIPIZIDE 5 MG PO TABS: 5.0000 mg | ORAL_TABLET | Freq: Every day | ORAL | 0 refills | Status: DC

## 2017-01-24 MED ORDER — METRONIDAZOLE 500 MG PO TABS: 500.0000 mg | ORAL_TABLET | Freq: Two times a day (BID) | ORAL | 0 refills | Status: DC

## 2017-01-24 NOTE — Progress Notes (Signed)
Subjective:  Patient ID: Julie Dawson, female    DOB: 1961/03/16  Age: 56 y.o. MRN: [id number]  CC: Vaginitis (2 mo--yeast infection--odor-yellow discharge-spotting after sex--discomfort during sex. went to ER--treat for. ) and Follow-up (FYI--in DM study--has card with her of infor)   Vaginal Discharge  The patient's primary symptoms include a genital odor, vaginal bleeding and vaginal discharge. The patient's pertinent negatives include no genital itching, genital lesions, genital rash, missed menses or pelvic pain. This is a recurrent problem. The current episode started more than 1 month ago (onset 10/2016). The problem occurs constantly. The problem has been unchanged. The patient is experiencing no pain. She is not pregnant. Pertinent negatives include no abdominal pain, back pain, chills, discolored urine, dysuria, fever, flank pain, frequency, painful intercourse, rash or urgency. The vaginal discharge was mucopurulent, thick and brown. The vaginal bleeding is spotting. She has not been passing clots. She has not been passing tissue. The symptoms are aggravated by intercourse. Treatments tried: azithromycin and rocephin IM. She is sexually active. It is unknown whether or not her partner has an STD. She uses nothing for contraception. She is postmenopausal (last menstrual cycle 92yrs ago). Her past medical history is significant for vaginosis. There is no history of menorrhagia, metrorrhagia, PID or an STD.   Vaginal spotting after intercourse only. No ABD pain, no pain with intercourse. Has not had menstrual cycle is last 39yrs. Had similar symptoms 10/2016. Treated for STD with no improvement. No medication for vaginosis. Been with same partner x 3years, no condom use.  DM: Has not been taking lisinopril nor glipizide as prescribed. Has not made any changes to diet, does not exercise.  Hyperlipidemia: Has not taken lipitor as prescribed.  Outpatient Medications Prior to Visit    Medication Sig Dispense Refill  . atorvastatin (LIPITOR) 20 MG tablet Take 1 tablet (20 mg total) by mouth daily at 6 PM. (Patient not taking: Reported on 01/24/2017) 90 tablet 1  . lisinopril (PRINIVIL,ZESTRIL) 2.5 MG tablet Take 1 tablet (2.5 mg total) by mouth daily. (Patient not taking: Reported on 01/24/2017) 90 tablet 1   No facility-administered medications prior to visit.     ROS See HPI  Objective:  BP 126/82   Pulse 92   Temp 98.3 F (36.8 C)   Ht 5' 3.5" (1.613 m)   Wt 167 lb (75.8 kg)   SpO2 99%   BMI 29.12 kg/m   BP Readings from Last 3 Encounters:  01/24/17 126/82  11/27/16 124/79  10/04/16 120/74    Wt Readings from Last 3 Encounters:  01/24/17 167 lb (75.8 kg)  11/27/16 166 lb (75.3 kg)  10/04/16 164 lb (74.4 kg)    Physical Exam  Constitutional: She is oriented to person, place, and time.  Abdominal: Soft. She exhibits no distension. There is no tenderness.  Genitourinary:  Genitourinary Comments: Declined pelvic exam  Neurological: She is alert and oriented to person, place, and time.   Lab Results  Component Value Date   WBC 10.1 11/27/2016   HGB 13.5 11/27/2016   HCT 39.7 11/27/2016   PLT 258 11/27/2016   GLUCOSE 195 (H) 11/27/2016   CHOL 247 (H) 05/24/2016   TRIG 141.0 05/24/2016   HDL 40.90 05/24/2016   LDLCALC 178 (H) 05/24/2016   ALT 17 07/19/2016   AST 18 07/19/2016   NA 137 11/27/2016   K 4.0 11/27/2016   CL 104 11/27/2016   CREATININE 1.17 (H) 11/27/2016   BUN 15 11/27/2016   CO2 22  11/27/2016   TSH 1.51 05/24/2016   HGBA1C 7.6% 01/24/2017    No results found.  Assessment & Plan:   Brenae was seen today for vaginitis and follow-up.  Diagnoses and all orders for this visit:  Vaginosis -     metroNIDAZOLE (FLAGYL) 500 MG tablet; Take 1 tablet (500 mg total) by mouth 2 (two) times daily. -     fluconazole (DIFLUCAN) 150 MG tablet; Take 1 tablet (150 mg total) by mouth once.  Type 2 diabetes mellitus with complication,  without long-term current use of insulin (HCC) -     POCT glycosylated hemoglobin (Hb A1C) -     glipiZIDE (GLUCOTROL) 5 MG tablet; Take 1 tablet (5 mg total) by mouth daily before breakfast. -     lisinopril (PRINIVIL,ZESTRIL) 2.5 MG tablet; Take 1 tablet (2.5 mg total) by mouth daily.   I am having Ms. Morrill start on metroNIDAZOLE, fluconazole, and glipiZIDE. I am also having her maintain her atorvastatin and lisinopril.  Meds ordered this encounter  Medications  . metroNIDAZOLE (FLAGYL) 500 MG tablet    Sig: Take 1 tablet (500 mg total) by mouth 2 (two) times daily.    Dispense:  14 tablet    Refill:  0    Order Specific Question:   Supervising Provider    Answer:   Tresa Garter [[id number]]  . fluconazole (DIFLUCAN) 150 MG tablet    Sig: Take 1 tablet (150 mg total) by mouth once.    Dispense:  1 tablet    Refill:  0    Order Specific Question:   Supervising Provider    Answer:   Tresa Garter [[id number]]  . glipiZIDE (GLUCOTROL) 5 MG tablet    Sig: Take 1 tablet (5 mg total) by mouth daily before breakfast.    Dispense:  90 tablet    Refill:  0    Order Specific Question:   Supervising Provider    Answer:   Tresa Garter [[id number]]  . lisinopril (PRINIVIL,ZESTRIL) 2.5 MG tablet    Sig: Take 1 tablet (2.5 mg total) by mouth daily.    Dispense:  90 tablet    Refill:  1    Order Specific Question:   Supervising Provider    Answer:   Tresa Garter [[id number]]    Follow-up: Return in about 4 weeks (around 02/21/2017) for DM and, hyperlipidemia ([place] location) fasting 6-8hrs.  Alysia Penna, NP

## 2017-01-24 NOTE — Patient Instructions (Addendum)
Call [facility] opthalmology: [phone] to reschedule appt.  Call [facility] to schedule mammogram.  You will be contacted about cologuard test kit.  Call office if no improvement in vaginal discharge in 1week. No intercourse in 1-2weeks.  Check glucose twice a day (before breakfast and dinner). Carbohydrate Counting for Diabetes Mellitus, Adult Carbohydrate counting is a method for keeping track of how many carbohydrates you eat. Eating carbohydrates naturally increases the amount of sugar (glucose) in the blood. Counting how many carbohydrates you eat helps keep your blood glucose within normal limits, which helps you manage your diabetes (diabetes mellitus). It is important to know how many carbohydrates you can safely have in each meal. This is different for every person. A diet and nutrition specialist (registered dietitian) can help you make a meal plan and calculate how many carbohydrates you should have at each meal and snack. Carbohydrates are found in the following foods:  Grains, such as breads and cereals.  Dried beans and soy products.  Starchy vegetables, such as potatoes, peas, and corn.  Fruit and fruit juices.  Milk and yogurt.  Sweets and snack foods, such as cake, cookies, candy, chips, and soft drinks.  How do I count carbohydrates? There are two ways to count carbohydrates in food. You can use either of the methods or a combination of both. Reading "Nutrition Facts" on packaged food The "Nutrition Facts" list is included on the labels of almost all packaged foods and beverages in the [place] It includes:  The serving size.  Information about nutrients in each serving, including the grams (g) of carbohydrate per serving.  To use the "Nutrition Facts":  Decide how many servings you will have.  Multiply the number of servings by the number of carbohydrates per serving.  The resulting number is the total amount of carbohydrates that you will be  having.  Learning standard serving sizes of other foods When you eat foods containing carbohydrates that are not packaged or do not include "Nutrition Facts" on the label, you need to measure the servings in order to count the amount of carbohydrates:  Measure the foods that you will eat with a food scale or measuring cup, if needed.  Decide how many standard-size servings you will eat.  Multiply the number of servings by 15. Most carbohydrate-rich foods have about 15 g of carbohydrates per serving. ? For example, if you eat 8 oz (170 g) of strawberries, you will have eaten 2 servings and 30 g of carbohydrates (2 servings x 15 g = 30 g).  For foods that have more than one food mixed, such as soups and casseroles, you must count the carbohydrates in each food that is included.  The following list contains standard serving sizes of common carbohydrate-rich foods. Each of these servings has about 15 g of carbohydrates:   hamburger bun or  English muffin.   oz (15 mL) syrup.   oz (14 g) jelly.  1 slice of bread.  1 six-inch tortilla.  3 oz (85 g) cooked rice or pasta.  4 oz (113 g) cooked dried beans.  4 oz (113 g) starchy vegetable, such as peas, corn, or potatoes.  4 oz (113 g) hot cereal.  4 oz (113 g) mashed potatoes or  of a large baked potato.  4 oz (113 g) canned or frozen fruit.  4 oz (120 mL) fruit juice.  4-6 crackers.  6 chicken nuggets.  6 oz (170 g) unsweetened dry cereal.  6 oz (170 g)  plain fat-free yogurt or yogurt sweetened with artificial sweeteners.  8 oz (240 mL) milk.  8 oz (170 g) fresh fruit or one small piece of fruit.  24 oz (680 g) popped popcorn.  Example of carbohydrate counting Sample meal  3 oz (85 g) chicken breast.  6 oz (170 g) brown rice.  4 oz (113 g) corn.  8 oz (240 mL) milk.  8 oz (170 g) strawberries with sugar-free whipped topping. Carbohydrate calculation 1. Identify the foods that contain  carbohydrates: ? Rice. ? Corn. ? Milk. ? Strawberries. 2. Calculate how many servings you have of each food: ? 2 servings rice. ? 1 serving corn. ? 1 serving milk. ? 1 serving strawberries. 3. Multiply each number of servings by 15 g: ? 2 servings rice x 15 g = 30 g. ? 1 serving corn x 15 g = 15 g. ? 1 serving milk x 15 g = 15 g. ? 1 serving strawberries x 15 g = 15 g. 4. Add together all of the amounts to find the total grams of carbohydrates eaten: ? 30 g + 15 g + 15 g + 15 g = 75 g of carbohydrates total. This information is not intended to replace advice given to you by your health care provider. Make sure you discuss any questions you have with your health care provider. Document Released: 04/18/2005 Document Revised: 11/06/2015 Document Reviewed: 09/30/2015 Elsevier Interactive Patient Education  [employer].

## 2017-01-25 ENCOUNTER — Telehealth: Payer: Self-pay | Admitting: Emergency Medicine

## 2017-01-25 NOTE — Telephone Encounter (Signed)
Pt called in stating that Glipizide seemed to be making her dizzy in the morning after taking it. 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 Spoke with Tammy Sours, NP since Claris Gower is out of the office and he stated to try taking 1/2 tablet in the morning, as morning time is ideal for this medication. Advised pt on Greg's suggestion and informed pt that I would send message to PCP and assistant.

## 2017-01-25 NOTE — Telephone Encounter (Signed)
Patient Name: Julie Dawson DOB: 11/23/1960 Initial Comment [name] states she had an appt for yesterday, diabetic trial study, last Thursday, didn't write readings, given medication: Glucosol, she took BS: 94, and wondering if she needs to take this at home, if a spike or drop in Blood Sugar, and doesn't know anything about other medication study. Nurse Assessment Nurse: Judee Clara, RN, Nicci Date/Time (Eastern Time): 01/25/2017 9:27:35 AM Confirm and document reason for call. If symptomatic, describe symptoms. ---[name] states she had an appt for yesterday, diabetic trial study was done last Thursday, given medication: Glucosol. Glyposide taken yesterday morning and she had an episode of feeling really bad yesterday, she took BS: 68. 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 Does the patient have any new or worsening symptoms? ---No Please document clinical information provided and list any resource used. ---Patient just has a question about her medication and the time she can take it. She denies needing triaged. Nurse: Judee Clara, RN, Nicci Date/Time (Eastern Time): 01/25/2017 9:40:36 AM Confirm and document reason for call. If symptomatic, describe symptoms. ---Patient state she has recently been started on Glipizide. She states she took it yesterday morning and had an episode of dizziness and not feeling right. She had eaten and still had a BS of 94, when checked during this episode. She wants to know if she can take her medication in the afternoon after work. She is not symptomatic now and declines a triage. Does the patient have any new or worsening symptoms? ---No Please document clinical information provided and list any resource used. ---Patient declined triaged and was warm transferred to her PCP office for a phone message to be left. Nurse: Judee Clara, RN, Nicci Date/Time (Eastern Time): 01/25/2017  9:44:54 AM Confirm and document reason for call. If symptomatic, describe symptoms. ---[name] denies need for triage. She has a medication question for her MD regarding her Glipizide. [name] warm transferred to her PCP office. Does the patient have any new or worsening symptoms? ---No Please document clinical information provided and list any resource used. ---Denies triage. Medication question only. Nurse: Judee Clara, RN, Nicci Date/Time (Eastern Time): 01/25/2017 9:55:20 AM Confirm and document reason for call. If symptomatic, describe symptoms. ---Patient denies the need for triage. She has a medication for her PCP and wants to speak to him about a trial study she is on. Does the patient have any new or worsening symptoms? ---No Please document clinical information provided and list any resource used. ---Triage declined Guidelines Guideline Title Affirmed Question Affirmed Notes Final Disposition User Clinical Call Corum, RN, [employer]

## 2017-01-26 NOTE — Telephone Encounter (Signed)
Pt report taking 5 mg and she is fine, no symptoms.   Pt [place] to stay on 5 mg for right now and pt to report if her BS drop or symptoms occur again.   Pt is to take this med with her biggest meal of the day.   Pt verbalized understand.

## 2017-01-26 NOTE — Telephone Encounter (Signed)
She can cut glipizide in half. So take glipizide 2.5mg  once a day.(witth food).

## 2017-02-03 ENCOUNTER — Telehealth: Payer: Self-pay | Admitting: Nurse Practitioner

## 2017-02-03 NOTE — Telephone Encounter (Signed)
Tired to call multiple times but no answer.   Julie Dawson is not going to stop treating patient. Pt is to report to them what changes has been made by their doctor and they go from their.

## 2017-02-03 NOTE — Telephone Encounter (Signed)
[facility] - [place] [phone]  Patient was put a diabatic medication by Claris Gower and she is not suppose to be on a diabatic medication while in the study. She wanted to follow up with the nurse on this mix up because the patient sugar has dropped. Please follow up.

## 2017-02-06 ENCOUNTER — Encounter: Payer: Self-pay | Admitting: Nurse Practitioner

## 2017-02-06 NOTE — Telephone Encounter (Signed)
Study Group called back.  I gave response from [place].  They did question if Julie Dawson did not want patient in study anymore?  I read response again.  They have requested patients OV notes stating a release should be on file.   I transferred over to medical records.

## 2017-02-07 ENCOUNTER — Telehealth: Payer: Self-pay | Admitting: Nurse Practitioner

## 2017-02-07 MED ORDER — GLUCOSE BLOOD VI STRP: ORAL_STRIP | 3 refills | Status: DC

## 2017-02-07 MED ORDER — ACCU-CHEK SOFT TOUCH LANCETS MISC: 3 refills | Status: DC

## 2017-02-07 NOTE — Telephone Encounter (Signed)
rx sent as pt requested.  

## 2017-02-10 ENCOUNTER — Encounter: Payer: Self-pay | Admitting: Nurse Practitioner

## 2017-02-11 ENCOUNTER — Encounter: Payer: Self-pay | Admitting: Nurse Practitioner

## 2017-02-16 LAB — CBC AND DIFFERENTIAL
HCT: 39 (ref 36–46)
HEMOGLOBIN: 13.3 (ref 12.0–16.0)
WBC: 8.8

## 2017-02-16 LAB — HEPATIC FUNCTION PANEL
ALT: 17 (ref 7–35)
AST: 17 (ref 13–35)
Bilirubin, Total: 0.3

## 2017-02-16 LAB — BASIC METABOLIC PANEL
CREATININE: 1.1 (ref 0.5–1.1)
GLUCOSE: 0
POTASSIUM: 4.6 (ref 3.4–5.3)
Sodium: 140 (ref 137–147)

## 2017-02-16 LAB — HEMOGLOBIN A1C: HEMOGLOBIN A1C: 0

## 2017-02-16 LAB — LIPID PANEL
CHOLESTEROL: 245 — AB (ref 0–200)
HDL: 41 (ref 35–70)
LDL CALC: 173
TRIGLYCERIDES: 155 (ref 40–160)

## 2017-03-06 ENCOUNTER — Encounter: Payer: Self-pay | Admitting: Nurse Practitioner

## 2017-03-06 NOTE — Progress Notes (Signed)
Abstracted result and sent to scan  

## 2017-03-26 ENCOUNTER — Other Ambulatory Visit: Payer: Self-pay | Admitting: Family Medicine

## 2017-03-26 ENCOUNTER — Encounter: Payer: Self-pay | Admitting: Nurse Practitioner

## 2017-03-26 DIAGNOSIS — B001 Herpesviral vesicular dermatitis: Secondary | ICD-10-CM

## 2017-03-27 ENCOUNTER — Other Ambulatory Visit: Payer: Self-pay | Admitting: Nurse Practitioner

## 2017-03-27 MED ORDER — ACYCLOVIR 200 MG PO CAPS: 200.0000 mg | ORAL_CAPSULE | Freq: Every day | ORAL | 0 refills | Status: DC

## 2017-05-08 ENCOUNTER — Other Ambulatory Visit: Payer: Self-pay | Admitting: Nurse Practitioner

## 2017-05-08 ENCOUNTER — Encounter: Payer: Self-pay | Admitting: Nurse Practitioner

## 2017-05-08 DIAGNOSIS — E118 Type 2 diabetes mellitus with unspecified complications: Secondary | ICD-10-CM

## 2017-05-10 ENCOUNTER — Other Ambulatory Visit: Payer: Self-pay | Admitting: Nurse Practitioner

## 2017-05-10 DIAGNOSIS — E118 Type 2 diabetes mellitus with unspecified complications: Secondary | ICD-10-CM

## 2017-05-11 ENCOUNTER — Telehealth: Payer: Self-pay | Admitting: Nurse Practitioner

## 2017-05-11 ENCOUNTER — Other Ambulatory Visit: Payer: Self-pay

## 2017-05-11 DIAGNOSIS — E118 Type 2 diabetes mellitus with unspecified complications: Secondary | ICD-10-CM

## 2017-05-11 MED ORDER — GLIPIZIDE 5 MG PO TABS: 5.0000 mg | ORAL_TABLET | Freq: Every day | ORAL | 0 refills | Status: DC

## 2017-05-11 MED ORDER — LISINOPRIL 2.5 MG PO TABS: 2.5000 mg | ORAL_TABLET | Freq: Every day | ORAL | 0 refills | Status: DC

## 2017-05-11 NOTE — Telephone Encounter (Signed)
Copied from CRM [id number]. Topic: Quick Communication - Rx Refill/Question >> May 11, 2017  8:38 AM Darletta MollLander, Lumin L wrote: Medication: Glipizide (was sent 01/08 but pharm said they didn't have it), lisinopril (PRINIVIL,ZESTRIL) 2.5 MG tablet, Lancets (ACCU-CHEK SOFT TOUCH) lancets, glucose blood (ACCU-CHEK AVIVA) test strip, needles30G fine gauge  Has the patient contacted their pharmacy? Yes.     (Agent: If no, request that the patient contact the pharmacy for the refill.)   Preferred Pharmacy (with phone number or street name): [id number] [place] - [address]    Agent: Please be advised that RX refills may take up to 3 business days. We ask that you follow-up with your pharmacy.  Patient says her diabetic supplies went through [facility].

## 2017-05-12 ENCOUNTER — Encounter: Payer: Self-pay | Admitting: Nurse Practitioner

## 2017-05-28 ENCOUNTER — Encounter (HOSPITAL_COMMUNITY): Payer: Self-pay

## 2017-05-28 ENCOUNTER — Emergency Department (HOSPITAL_COMMUNITY): Payer: BC Managed Care – PPO

## 2017-05-28 ENCOUNTER — Other Ambulatory Visit: Payer: Self-pay

## 2017-05-28 ENCOUNTER — Emergency Department (HOSPITAL_COMMUNITY)
Admission: EM | Admit: 2017-05-28 | Discharge: 2017-05-28 | Disposition: A | Discharge status: Home / Self Care | Payer: BC Managed Care – PPO | Attending: Emergency Medicine | Admitting: Emergency Medicine

## 2017-05-28 DIAGNOSIS — L02214 Cutaneous abscess of groin: Secondary | ICD-10-CM | POA: Diagnosis not present

## 2017-05-28 DIAGNOSIS — R69 Illness, unspecified: Secondary | ICD-10-CM

## 2017-05-28 DIAGNOSIS — J111 Influenza due to unidentified influenza virus with other respiratory manifestations: Secondary | ICD-10-CM | POA: Diagnosis not present

## 2017-05-28 DIAGNOSIS — Z79899 Other long term (current) drug therapy: Secondary | ICD-10-CM | POA: Diagnosis not present

## 2017-05-28 DIAGNOSIS — F17210 Nicotine dependence, cigarettes, uncomplicated: Secondary | ICD-10-CM | POA: Insufficient documentation

## 2017-05-28 DIAGNOSIS — E119 Type 2 diabetes mellitus without complications: Secondary | ICD-10-CM | POA: Insufficient documentation

## 2017-05-28 DIAGNOSIS — R0789 Other chest pain: Secondary | ICD-10-CM | POA: Insufficient documentation

## 2017-05-28 LAB — CBC
HCT: 38 % (ref 36.0–46.0)
HEMOGLOBIN: 12.9 g/dL (ref 12.0–15.0)
MCH: 27.6 pg (ref 26.0–34.0)
MCHC: 33.9 g/dL (ref 30.0–36.0)
MCV: 81.2 fL (ref 78.0–100.0)
Platelets: 236 10*3/uL (ref 150–400)
RBC: 4.68 MIL/uL (ref 3.87–5.11)
RDW: 13.8 % (ref 11.5–15.5)
WBC: 7.4 10*3/uL (ref 4.0–10.5)

## 2017-05-28 LAB — BASIC METABOLIC PANEL
ANION GAP: 11 (ref 5–15)
BUN: 14 mg/dL (ref 6–20)
CO2: 21 mmol/L — AB (ref 22–32)
Calcium: 9 mg/dL (ref 8.9–10.3)
Chloride: 104 mmol/L (ref 101–111)
Creatinine, Ser: 1.32 mg/dL — ABNORMAL HIGH (ref 0.44–1.00)
GFR, EST AFRICAN AMERICAN: 51 mL/min — AB (ref >60)
GFR, EST NON AFRICAN AMERICAN: 44 mL/min — AB (ref >60)
Glucose, Bld: 175 mg/dL — ABNORMAL HIGH (ref 65–99)
Potassium: 4.1 mmol/L (ref 3.5–5.1)
SODIUM: 136 mmol/L (ref 135–145)

## 2017-05-28 LAB — I-STAT TROPONIN, ED: TROPONIN I, POC: 0 ng/mL (ref 0.00–0.08)

## 2017-05-28 LAB — I-STAT BETA HCG BLOOD, ED (MC, WL, AP ONLY)

## 2017-05-28 MED ORDER — OSELTAMIVIR PHOSPHATE 75 MG PO CAPS
75.0000 mg | ORAL_CAPSULE | Freq: Once | ORAL | Status: AC
  Administered 2017-05-28: 75 mg via ORAL
  Filled 2017-05-28: qty 1

## 2017-05-28 MED ORDER — SODIUM CHLORIDE 0.9 % IV BOLUS (SEPSIS)
1000.0000 mL | Freq: Once | INTRAVENOUS | Status: AC
  Administered 2017-05-28: 1000 mL via INTRAVENOUS

## 2017-05-28 MED ORDER — OSELTAMIVIR PHOSPHATE 75 MG PO CAPS: 75.0000 mg | ORAL_CAPSULE | Freq: Two times a day (BID) | ORAL | 0 refills | Status: DC

## 2017-05-28 MED ORDER — KETOROLAC TROMETHAMINE 30 MG/ML IJ SOLN
30.0000 mg | Freq: Once | INTRAMUSCULAR | Status: AC
  Administered 2017-05-28: 30 mg via INTRAVENOUS
  Filled 2017-05-28: qty 1

## 2017-05-28 MED ORDER — ACETAMINOPHEN 325 MG PO TABS
650.0000 mg | ORAL_TABLET | Freq: Once | ORAL | Status: AC
  Administered 2017-05-28: 650 mg via ORAL
  Filled 2017-05-28: qty 2

## 2017-05-28 NOTE — Discharge Instructions (Signed)
You were evaluated in the emergency department for body aches and cough associated with chest pain.  You likely have a viral flu.  Your chest x-ray did not show an obvious pneumonia.  Your heart workup was also normal.  You should drink plenty of fluids, take Tylenol and ibuprofen for fever and pain, and we are prescribing new medication that may help with the flu.  You should follow-up with your doctor for this this week for reevaluation.  You also had a small abscess in your groin but that is drained.  It does not need to be further drained today.  Use a warm wet facecloth to the area and return if it is getting any worse.

## 2017-05-28 NOTE — ED Triage Notes (Signed)
Pt states that she began to have a cough along with CP and SOB yesterday. Denies other cardiac symptoms. Pt states that she also has a abscess in her L groin area that has been there for four days and started draining two days ago.

## 2017-05-28 NOTE — ED Provider Notes (Signed)
[facility] Provider Note   CSN: [id number] Arrival date & time: 05/28/17  0636     History   Chief Complaint Chief Complaint  Patient presents with  . Chest Pain  . Abscess    HPI Julie Dawson is a 57 y.o. female.  The history is provided by the patient.  Chest Pain   This is a new problem. The current episode started 12 to 24 hours ago. The problem has not changed since onset.The pain is associated with coughing. The pain is present in the substernal region. The pain is at a severity of 5/10. The quality of the pain is described as stabbing. The pain does not radiate. Associated symptoms include cough and headaches. Pertinent negatives include no abdominal pain, no back pain, no diaphoresis, no fever, no hemoptysis, no leg pain, no nausea, no numbness, no palpitations, no shortness of breath, no sputum production, no syncope and no vomiting. She has tried nothing for the symptoms. Risk factors include smoking/tobacco exposure.  Her past medical history is significant for diabetes and hyperlipidemia.  Abscess  Associated symptoms: headaches   Associated symptoms: no fever, no nausea and no vomiting     57 year old female with history of diabetes and hyperlipidemia presents with 1 day of myalgias headache postnasal drip cough nonproductive with associated chest pain.  Patient did get a flu shot this year, no sick contacts.  She is tried nothing for her symptoms.  She does not know she had any fever.  She is also complaining of a spontaneous abscess drainage from her left peri-  Labial area, that occurred about 2 days ago.  She states there was some bloody drainage that smelled foul but seems improved since strain.  She has had this happen before.    Past Medical History:  Diagnosis Date  . Allergy    spring time only   . Constipation    doesnt use otc medicines  . Diabetes mellitus without complication (HCC)   . History of positive PPD    cannot do the tine test,  has to do CXR  . Hyperlipidemia     Patient Active Problem List   Diagnosis Date Noted  . Type 2 diabetes mellitus with complication, without long-term current use of insulin (HCC) 08/23/2016  . 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 INSOMNIA 12/31/2009  . HERPES LABIALIS 01/21/2009  . ONYCHOMYCOSIS 07/29/2008  . HYPERCHOLESTEROLEMIA 07/29/2008  . DENTAL CARIES 04/21/2008  . OBESITY 03/13/2008  . TOBACCO ABUSE 03/13/2008  . MENOPAUSAL SYNDROME 03/13/2008  . HEADACHE 03/13/2008  . DRUG ABUSE, HX OF 03/13/2008    Past Surgical History:  Procedure Laterality Date  . cervical adenitis node remova    . ECTOPIC PREGNANCY SURGERY    . MOUTH SURGERY     for wisdom teeth removal    OB History    No data available       Home Medications    Prior to Admission medications   Medication Sig Start Date End Date Taking? Authorizing Provider  atorvastatin (LIPITOR) 20 MG tablet Take 1 tablet (20 mg total) by mouth daily at 6 PM. Patient not taking: Reported on 01/24/2017 08/22/16   Nche, Bonna Gainsharlotte Lum, NP  glipiZIDE (GLUCOTROL) 5 MG tablet Take 1 tablet (5 mg total) by mouth daily before breakfast. Need office visit for additional refills 05/11/17   Nche, Bonna Gainsharlotte Lum, NP  glucose blood (ACCU-CHEK AVIVA) test strip Check  blood sugar once daily before breakfast. E11.8 02/07/17   Nche, Bonna Gains, NP  Lancets (ACCU-CHEK SOFT TOUCH) lancets Check blood sugar once daily before breakfast. E11.8 02/07/17   Nche, Bonna Gains, NP  lisinopril (PRINIVIL,ZESTRIL) 2.5 MG tablet Take 1 tablet (2.5 mg total) by mouth daily. 05/11/17   Nche, Bonna Gains, NP  metroNIDAZOLE (FLAGYL) 500 MG tablet Take 1 tablet (500 mg total) by mouth 2 (two) times daily. 01/24/17   Nche, Bonna Gains, NP    Family History Family History  Problem Relation Age of Onset  . Diabetes Mother   . Hypertension Mother   . COPD Mother     . Stroke Father   . Diabetes Father   . Hypertension Father   . Diabetes Sister   . Drug abuse Brother   . Alcohol abuse Sister   . Colon cancer Neg Hx   . Colon polyps Neg Hx   . Esophageal cancer Neg Hx   . Rectal cancer Neg Hx   . Stomach cancer Neg Hx     Social History Social History   Tobacco Use  . Smoking status: Current Every Day Smoker    Packs/day: 1.00    Types: Cigarettes  . Smokeless tobacco: Never Used  . Tobacco comment: PT REQUESTING PATCH  Substance Use Topics  . Alcohol use: No  . 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     Allergies   Patient has no known allergies.   Review of Systems Review of Systems  Constitutional: Negative for diaphoresis and fever.  HENT: Positive for postnasal drip. Negative for ear pain.   Eyes: Negative for pain.  Respiratory: Positive for cough. Negative for hemoptysis, sputum production and shortness of breath.   Cardiovascular: Positive for chest pain. Negative for palpitations and syncope.  Gastrointestinal: Negative for abdominal pain, nausea and vomiting.  Genitourinary: Positive for genital sores. Negative for dysuria.  Musculoskeletal: Positive for myalgias. Negative for back pain.  Skin: Negative for rash.  Neurological: Positive for headaches. Negative for numbness.     Physical Exam Updated Vital Signs BP 97/73   Pulse (!) 112   Temp 99.4 F (37.4 C) (Oral)   Resp 18   SpO2 100%   Physical Exam  Constitutional: She appears well-developed and well-nourished. No distress.  HENT:  Head: Normocephalic and atraumatic.  Eyes: Conjunctivae are normal.  Neck: Neck supple.  Cardiovascular: Regular rhythm. Tachycardia present.  No murmur heard. Pulmonary/Chest: Effort normal and breath sounds normal. No respiratory distress.  Abdominal: Soft. There is no tenderness.  Musculoskeletal: Normal range of motion. She exhibits no edema.  Neurological: She is alert. GCS eye subscore is  4. GCS verbal subscore is 5. GCS motor subscore is 6.  Skin: Skin is warm and dry.     Psychiatric: She has a normal mood and affect.  Nursing note and vitals reviewed.    ED Treatments / Results  Labs (all labs ordered are listed, but only abnormal results are displayed) Labs Reviewed  BASIC METABOLIC PANEL - Abnormal; Notable for the following components:      Result Value   CO2 21 (*)    Glucose, Bld 175 (*)    Creatinine, Ser 1.32 (*)    GFR calc non Af Amer 44 (*)    GFR calc Af Amer 51 (*)    All other components within normal limits  CBC  I-STAT TROPONIN, ED  I-STAT BETA HCG BLOOD, ED ([facility], [facility], [facility] ONLY)  EKG  EKG Interpretation  Date/Time:  Sunday May 28 2017 06:51:37 EST Ventricular Rate:  116 PR Interval:    QRS Duration: 76 QT Interval:  450 QTC Calculation: 625 R Axis:   55 Text Interpretation:  Sinus tachycardia Cannot rule out Anterior infarct , age undetermined Prolonged QT Abnormal ECG Similar to prior 1/17 Confirmed by Amritpal Shropshire ([id number]) on 05/28/2017 7:27:24 AM       Radiology Dg Chest 2 View  Result Date: 05/28/2017 CLINICAL DATA:  Cough and shortness of Breath EXAM: CHEST  2 VIEW COMPARISON:  06/15/2016 FINDINGS: The heart size and mediastinal contours are within normal limits. Both lungs are clear. The visualized skeletal structures are unremarkable. IMPRESSION: No active cardiopulmonary disease. Electronically Signed   By: Mark  Lukens M.D.   On: 05/28/2017 07:46    Procedures Procedures (including critical care time)  Medications Ordered in ED Medications - No data to display   Initial Impression / Assessment and Plan / ED Course  I have reviewed the triage vital signs and the nursing notes.  Pertinent labs & imaging results that were available during my care of the patient were reviewed by me and considered in my medical decision making (see chart for details).  Clinical Course as of May 28 1546  Sun May 28, 2017  0857  Re-eval.  Patient feeling better after medications and fluids.  We will reattempt her and if it is improved she will be discharged to follow-up with her primary care doctor.  [MB]    Clinical Course User Index [MB] Seville Downs C, MD     Final Clinical Impressions(s) / ED Diagnoses   Final diagnoses:  Influenza-like illness  Acute chest wall pain  Abscess of groin, left    ED Discharge Orders        Ordered    oseltamivir (TAMIFLU) 75 MG capsule  Every 12 hours     01 /27/19 0854       Terrilee Files, MD 05/28/17 [phone]

## 2017-05-28 NOTE — ED Notes (Signed)
Declined W/C at D/C and was escorted to lobby by RN. 

## 2017-06-16 ENCOUNTER — Other Ambulatory Visit: Payer: Self-pay

## 2017-06-16 ENCOUNTER — Emergency Department (HOSPITAL_COMMUNITY): Payer: BC Managed Care – PPO

## 2017-06-16 ENCOUNTER — Emergency Department (HOSPITAL_COMMUNITY)
Admission: EM | Admit: 2017-06-16 | Discharge: 2017-06-16 | Disposition: A | Discharge status: Home / Self Care | Payer: BC Managed Care – PPO | Attending: Emergency Medicine | Admitting: Emergency Medicine

## 2017-06-16 DIAGNOSIS — R0602 Shortness of breath: Secondary | ICD-10-CM | POA: Insufficient documentation

## 2017-06-16 DIAGNOSIS — F17210 Nicotine dependence, cigarettes, uncomplicated: Secondary | ICD-10-CM | POA: Diagnosis not present

## 2017-06-16 DIAGNOSIS — R072 Precordial pain: Secondary | ICD-10-CM

## 2017-06-16 DIAGNOSIS — Z7984 Long term (current) use of oral hypoglycemic drugs: Secondary | ICD-10-CM | POA: Diagnosis not present

## 2017-06-16 DIAGNOSIS — Z79899 Other long term (current) drug therapy: Secondary | ICD-10-CM | POA: Diagnosis not present

## 2017-06-16 DIAGNOSIS — E119 Type 2 diabetes mellitus without complications: Secondary | ICD-10-CM | POA: Diagnosis not present

## 2017-06-16 DIAGNOSIS — R079 Chest pain, unspecified: Secondary | ICD-10-CM | POA: Diagnosis present

## 2017-06-16 LAB — BASIC METABOLIC PANEL
Anion gap: 11 (ref 5–15)
BUN: 17 mg/dL (ref 6–20)
CALCIUM: 9.2 mg/dL (ref 8.9–10.3)
CO2: 23 mmol/L (ref 22–32)
CREATININE: 1.21 mg/dL — AB (ref 0.44–1.00)
Chloride: 106 mmol/L (ref 101–111)
GFR calc non Af Amer: 49 mL/min — ABNORMAL LOW (ref >60)
GFR, EST AFRICAN AMERICAN: 57 mL/min — AB (ref >60)
Glucose, Bld: 85 mg/dL (ref 65–99)
Potassium: 4.3 mmol/L (ref 3.5–5.1)
Sodium: 140 mmol/L (ref 135–145)

## 2017-06-16 LAB — CBC
HCT: 38 % (ref 36.0–46.0)
Hemoglobin: 12.5 g/dL (ref 12.0–15.0)
MCH: 27.3 pg (ref 26.0–34.0)
MCHC: 32.9 g/dL (ref 30.0–36.0)
MCV: 83 fL (ref 78.0–100.0)
PLATELETS: 305 10*3/uL (ref 150–400)
RBC: 4.58 MIL/uL (ref 3.87–5.11)
RDW: 14 % (ref 11.5–15.5)
WBC: 9.8 10*3/uL (ref 4.0–10.5)

## 2017-06-16 LAB — I-STAT TROPONIN, ED
TROPONIN I, POC: 0 ng/mL (ref 0.00–0.08)
Troponin i, poc: 0 ng/mL (ref 0.00–0.08)

## 2017-06-16 LAB — I-STAT BETA HCG BLOOD, ED (MC, WL, AP ONLY): I-stat hCG, quantitative: <5 m[IU]/mL (ref <5)

## 2017-06-16 NOTE — ED Triage Notes (Signed)
Pt c/o mid non radiating CP onset today 45 mins ago, pt c/o SOB, denies  N/v/d, pt hx of DM, pt A&O x4, denies cardiac hx

## 2017-06-16 NOTE — Discharge Instructions (Signed)
Please read and follow all provided instructions.  Your diagnoses today include:  1. Precordial pain    Tests performed today include:  An EKG of your heart  A chest x-ray  Cardiac enzymes - a blood test for heart muscle damage  Blood counts and electrolytes  Vital signs. See below for your results today.   Medications prescribed:   None  Take any prescribed medications only as directed.  Follow-up instructions: Please follow-up with your primary care provider as needed for further evaluation of your symptoms.   Return instructions:  SEEK IMMEDIATE MEDICAL ATTENTION IF:  You have severe chest pain, especially if the pain is crushing or pressure-like and spreads to the arms, back, neck, or jaw, or if you have sweating, nausea (feeling sick to your stomach), or shortness of breath. THIS IS AN EMERGENCY. Don't wait to see if the pain will go away. Get medical help at once. Call 911 or 0 (operator). DO NOT drive yourself to the hospital.   Your chest pain gets worse and does not go away with rest.   You have an attack of chest pain lasting longer than usual, despite rest and treatment with the medications your caregiver has prescribed.   You wake from sleep with chest pain or shortness of breath.  You feel dizzy or faint.  You have chest pain not typical of your usual pain for which you originally saw your caregiver.   You have any other emergent concerns regarding your health.  Additional Information: Chest pain comes from many different causes. Your caregiver has diagnosed you as having chest pain that is not specific for one problem, but does not require admission.  You are at low risk for an acute heart condition or other serious illness.   Your vital signs today were: BP 123/80    Pulse 86    Temp 97.8 F (36.6 C) (Oral)    Resp 17    Ht 5\' 3"  (1.6 m)    Wt 74.4 kg (164 lb)    SpO2 99%    BMI 29.05 kg/m  If your blood pressure (BP) was elevated above 135/85 this  visit, please have this repeated by your doctor within one month. --------------

## 2017-06-16 NOTE — ED Notes (Signed)
Pt given [place] sandwich bag per OralJosh (PA)

## 2017-06-16 NOTE — ED Provider Notes (Signed)
[facility] EMERGENCY DEPARTMENT Provider Note   CSN: [id number] Arrival date & time: 06/16/17  0911     History   Chief Complaint Chief Complaint  Patient presents with  . Chest Pain    HPI Julie Dawson is a 57 y.o. female.  Patient with history of hypertension, diabetes, high cholesterol, smoking, family history of coronary artery disease --presents to the emergency department with complaint of midsternal chest pain starting approximately 30 minutes prior to arrival.  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  Pain was described as sharp.  She had associated shortness of breath.  Pain did not radiate.  She has not had any pain like this in the past or pain with exertion.  It feels different than when she was here several weeks ago with flulike symptoms.  Patient was in x-ray prior to my exam and raised her arms above her head.  At that time she had a belch which relieved her symptoms.  No associated vomiting or diaphoresis.  Patient reports ongoing cramping in her left leg for the past year intermittently, relieved with massage.  She has not noted any swelling.  She reports having imaging in the past to rule out blood clot.  No abdominal pain.  No treatments prior to arrival.  No history of MI or other heart problems.      Past Medical History:  Diagnosis Date  . Allergy    spring time only   . Constipation    doesnt use otc medicines  . Diabetes mellitus without complication (HCC)   . History of positive PPD    cannot do the tine test,  has to do CXR  . Hyperlipidemia     Patient Active Problem List   Diagnosis Date Noted  . Type 2 diabetes mellitus with complication, without long-term current use of insulin (HCC) 08/23/2016  . 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 INSOMNIA 12/31/2009  . HERPES LABIALIS 01/21/2009  . ONYCHOMYCOSIS 07/29/2008  . HYPERCHOLESTEROLEMIA 07/29/2008  . DENTAL  CARIES 04/21/2008  . OBESITY 03/13/2008  . TOBACCO ABUSE 03/13/2008  . MENOPAUSAL SYNDROME 03/13/2008  . HEADACHE 03/13/2008  . DRUG ABUSE, HX OF 03/13/2008    Past Surgical History:  Procedure Laterality Date  . cervical adenitis node remova    . ECTOPIC PREGNANCY SURGERY    . MOUTH SURGERY     for wisdom teeth removal    OB History    No data available       Home Medications    Prior to Admission medications   Medication Sig Start Date End Date Taking? Authorizing Provider  atorvastatin (LIPITOR) 20 MG tablet Take 1 tablet (20 mg total) by mouth daily at 6 PM. Patient not taking: Reported on 01/24/2017 08/22/16   Nche, Bonna Gains, NP  glipiZIDE (GLUCOTROL) 5 MG tablet Take 1 tablet (5 mg total) by mouth daily before breakfast. Need office visit for additional refills 05/11/17   Nche, Bonna Gains, NP  glucose blood (ACCU-CHEK AVIVA) test strip Check blood sugar once daily before breakfast. E11.8 02/07/17   Nche, Bonna Gains, NP  Lancets (ACCU-CHEK SOFT TOUCH) lancets Check blood sugar once daily before breakfast. E11.8 02/07/17   Nche, Bonna Gains, NP  lisinopril (PRINIVIL,ZESTRIL) 2.5 MG tablet Take 1 tablet (2.5 mg total) by mouth daily. 05/11/17   Nche, Bonna Gains, NP  metroNIDAZOLE (FLAGYL) 500 MG tablet Take 1 tablet (500 mg total) by mouth  2 (two) times daily. 01/24/17   Nche, Bonna Gains, NP  oseltamivir (TAMIFLU) 75 MG capsule Take 1 capsule (75 mg total) by mouth every 12 (twelve) hours. 05/28/17   Terrilee Files, MD    Family History Family History  Problem Relation Age of Onset  . Diabetes Mother   . Hypertension Mother   . COPD Mother   . Stroke Father   . Diabetes Father   . Hypertension Father   . Diabetes Sister   . Drug abuse Brother   . Alcohol abuse Sister   . Colon cancer Neg Hx   . Colon polyps Neg Hx   . Esophageal cancer Neg Hx   . Rectal cancer Neg Hx   . Stomach cancer Neg Hx     Social History Social History   Tobacco Use    . Smoking status: Current Every Day Smoker    Packs/day: 1.00    Types: Cigarettes  . Smokeless tobacco: Never Used  . Tobacco comment: PT REQUESTING PATCH  Substance Use Topics  . Alcohol use: No  . 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     Allergies   Patient has no known allergies.   Review of Systems Review of Systems  Constitutional: Negative for diaphoresis and fever.  Eyes: Negative for redness.  Respiratory: Positive for shortness of breath. Negative for cough.   Cardiovascular: Positive for chest pain. Negative for palpitations and leg swelling.  Gastrointestinal: Negative for abdominal pain, nausea and vomiting.  Genitourinary: Negative for dysuria.  Musculoskeletal: Positive for myalgias. Negative for back pain and neck pain.  Skin: Negative for rash.  Neurological: Negative for syncope and light-headedness.  Psychiatric/Behavioral: The patient is not nervous/anxious.      Physical Exam Updated Vital Signs BP 117/81   Pulse 87   Temp 97.8 F (36.6 C) (Oral)   Resp 17   Ht 5\' 3"  (1.6 m)   Wt 74.4 kg (164 lb)   SpO2 99%   BMI 29.05 kg/m   Physical Exam  Constitutional: She appears well-developed and well-nourished.  HENT:  Head: Normocephalic and atraumatic.  Mouth/Throat: Mucous membranes are normal. Mucous membranes are not dry.  Eyes: Conjunctivae are normal.  Neck: Trachea normal and normal range of motion. Neck supple. Normal carotid pulses and no JVD present. No muscular tenderness present. Carotid bruit is not present. No tracheal deviation present.  Cardiovascular: Normal rate, regular rhythm, S1 normal, S2 normal, normal heart sounds and intact distal pulses. Exam reveals no decreased pulses.  No murmur heard. Pulmonary/Chest: Effort normal. No respiratory distress. She has no wheezes. She exhibits no tenderness.  Abdominal: Soft. Normal aorta and bowel sounds are normal. There is no tenderness. There is no rebound and  no guarding.  Musculoskeletal: Normal range of motion.       Right lower leg: She exhibits no tenderness and no edema.       Left lower leg: She exhibits no tenderness and no edema.  Evaluated bilateral lower extremities including her left calf where she has had tight sensation.  No clinical findings of DVT.  No tenderness.  Neurological: She is alert.  Skin: Skin is warm and dry. She is not diaphoretic. No cyanosis. No pallor.  Psychiatric: She has a normal mood and affect.  Nursing note and vitals reviewed.    ED Treatments / Results  Labs (all labs ordered are listed, but only abnormal results are displayed) Labs Reviewed  BASIC METABOLIC PANEL -  Abnormal; Notable for the following components:      Result Value   Creatinine, Ser 1.21 (*)    GFR calc non Af Amer 49 (*)    GFR calc Af Amer 57 (*)    All other components within normal limits  CBC  I-STAT TROPONIN, ED  I-STAT BETA HCG BLOOD, ED ([facility], [facility], [facility] ONLY)    ED ECG REPORT   Date: 06/16/2017  Rate: 92  Rhythm: normal sinus rhythm  QRS Axis: normal  Intervals: normal  ST/T Wave abnormalities: normal  Conduction Disutrbances:none  Narrative Interpretation: no significant ST/T abnormality  Old EKG Reviewed: unchanged from 05/28/17  I have personally reviewed the EKG tracing and disagree with the computerized printout as noted.  Radiology Dg Chest 2 View  Result Date: 06/16/2017 CLINICAL DATA:  Midline chest pain for 1 hour EXAM: CHEST  2 VIEW COMPARISON:  05/28/2017 FINDINGS: Artifact from EKG leads. Normal heart size and mediastinal contours. No acute infiltrate or edema. No effusion or pneumothorax. No acute osseous findings. IMPRESSION: Negative chest. Electronically Signed   By: Marnee SpringJonathon  Watts M.D.   On: 06/16/2017 09:49    Procedures Procedures (including critical care time)  Medications Ordered in ED Medications - No data to display   Initial Impression / Assessment and Plan / ED Course  I have reviewed  the triage vital signs and the nursing notes.  Pertinent labs & imaging results that were available during my care of the patient were reviewed by me and considered in my medical decision making (see chart for details).     Patient seen and examined. Work-up reviewed with patient.  She is feeling much better.  Her main concern at this time is to get something to eat.  Given risk factors and recent onset of pain, feel that delta troponin is worthwhile despite atypical features.  Patient agrees.  Vital signs reviewed and are as follows: BP 117/81   Pulse 87   Temp 97.8 F (36.6 C) (Oral)   Resp 17   Ht 5\' 3"  (1.6 m)   Wt 74.4 kg (164 lb)   SpO2 99%   BMI 29.05 kg/m   1:22 PM Repeat EKG as below.   Pt updated. She remains pain free. Suspect GI etiology. Feel safe for d/c home. Encouraged PCP f/u.   ED ECG REPORT   Date: 06/16/2017  Rate: 84  Rhythm: normal sinus rhythm  QRS Axis: normal  Intervals: normal  ST/T Wave abnormalities: normal  Conduction Disutrbances:none  Narrative Interpretation: V6 artifact but otherwise unchanged  Old EKG Reviewed: unchanged  I have personally reviewed the EKG tracing and agree with the computerized printout as noted.  Patient was counseled to return with severe chest pain, especially if the pain is crushing or pressure-like and spreads to the arms, back, neck, or jaw, or if they have sweating, nausea, or shortness of breath with the pain. They were encouraged to call 911 with these symptoms.   They were also told to return if their chest pain gets worse and does not go away with rest, they have an attack of chest pain lasting longer than usual despite rest and treatment with the medications their caregiver has prescribed, if they wake from sleep with chest pain or shortness of breath, if they feel dizzy or faint, if they have chest pain not typical of their usual pain, or if they have any other emergent concerns regarding their health.  The  patient verbalized understanding and agreed.   Final  Clinical Impressions(s) / ED Diagnoses   Final diagnoses:  Precordial pain   Patient with several risk factors for coronary artery disease presents with atypical chest pain today described as sharp.  No radiation, exertional pain, diaphoresis, or vomiting.  Pain resolved after belching while obtaining chest x-ray.  She has not had any recurrence of pain while here.  Troponin negative x2.  EKG without dynamic changes.  No clinical signs of DVT, hypoxia, tachycardia, or other findings suggestive of PE today.  Patient to monitor symptoms at home and follow-up with PCP if symptoms return.  Return instructions to the emergency department as above.  ED Discharge Orders    None       Renne Crigler, Cordelia Poche 06/16/17 1330    Arby Barrette, MD 06/16/17 1756

## 2017-07-13 ENCOUNTER — Ambulatory Visit: Payer: BC Managed Care – PPO | Admitting: Nurse Practitioner

## 2017-07-13 ENCOUNTER — Encounter: Payer: Self-pay | Admitting: Nurse Practitioner

## 2017-07-13 ENCOUNTER — Other Ambulatory Visit (INDEPENDENT_AMBULATORY_CARE_PROVIDER_SITE_OTHER): Payer: BC Managed Care – PPO

## 2017-07-13 VITALS — BP 110/80 | HR 73 | Temp 98.7°F | Resp 16 | Ht 63.0 in | Wt 167.0 lb

## 2017-07-13 DIAGNOSIS — R202 Paresthesia of skin: Secondary | ICD-10-CM | POA: Diagnosis not present

## 2017-07-13 DIAGNOSIS — F17219 Nicotine dependence, cigarettes, with unspecified nicotine-induced disorders: Secondary | ICD-10-CM | POA: Diagnosis not present

## 2017-07-13 DIAGNOSIS — E7800 Pure hypercholesterolemia, unspecified: Secondary | ICD-10-CM

## 2017-07-13 DIAGNOSIS — E118 Type 2 diabetes mellitus with unspecified complications: Secondary | ICD-10-CM | POA: Diagnosis not present

## 2017-07-13 LAB — COMPREHENSIVE METABOLIC PANEL
ALBUMIN: 4.6 g/dL (ref 3.5–5.2)
ALK PHOS: 93 U/L (ref 39–117)
ALT: 14 U/L (ref 0–35)
AST: 13 U/L (ref 0–37)
BILIRUBIN TOTAL: 0.5 mg/dL (ref 0.2–1.2)
BUN: 13 mg/dL (ref 6–23)
CO2: 28 mEq/L (ref 19–32)
CREATININE: 1.18 mg/dL (ref 0.40–1.20)
Calcium: 9.9 mg/dL (ref 8.4–10.5)
Chloride: 106 mEq/L (ref 96–112)
GFR: 60.84 mL/min (ref >60.00)
GLUCOSE: 101 mg/dL — AB (ref 70–99)
Potassium: 4.7 mEq/L (ref 3.5–5.1)
SODIUM: 140 meq/L (ref 135–145)
TOTAL PROTEIN: 7.8 g/dL (ref 6.0–8.3)

## 2017-07-13 LAB — LIPID PANEL
CHOLESTEROL: 241 mg/dL — AB (ref 0–200)
HDL: 37.8 mg/dL — ABNORMAL LOW (ref >39.00)
LDL Cholesterol: 172 mg/dL — ABNORMAL HIGH (ref 0–99)
NonHDL: 202.86
Total CHOL/HDL Ratio: 6
Triglycerides: 154 mg/dL — ABNORMAL HIGH (ref 0.0–149.0)
VLDL: 30.8 mg/dL (ref 0.0–40.0)

## 2017-07-13 LAB — TSH: TSH: 2 u[IU]/mL (ref 0.35–4.50)

## 2017-07-13 LAB — HEMOGLOBIN A1C: HEMOGLOBIN A1C: 6.8 % — AB (ref 4.6–6.5)

## 2017-07-13 MED ORDER — VARENICLINE TARTRATE 1 MG PO TABS
1.0000 mg | ORAL_TABLET | Freq: Two times a day (BID) | ORAL | 2 refills | Status: DC
Start: 2017-07-13 — End: 2018-02-19

## 2017-07-13 MED ORDER — VARENICLINE TARTRATE 0.5 MG PO TABS: 0.5000 mg | ORAL_TABLET | Freq: Two times a day (BID) | ORAL | 0 refills | Status: DC

## 2017-07-13 NOTE — Progress Notes (Signed)
Name: Julie Dawson   MRN: [id number]    DOB: 1960-11-29   Date:07/13/2017       Progress Note  Subjective  Chief Complaint  Chief Complaint  Patient presents with  . Establish Care    Diabetes managment, hands getting numb thinks Julie Dawson is getting neuropathy, wants to quit smoking    HPI Julie Dawson is establishing care with me today transferring from a provider in our clinic who is no longer here. Julie Dawson would like to follow up on her diabetes and cholesterol and discuss smoking cessation.  Diabetes- maintained on glipizide 5 Julie Dawson says Julie Dawson was also started on Lisinopril 2.5 for her diabetes. Julie Dawson takes both medications daily without adverse medication effects. Reports Julie Dawson checks her blood sugars once every morning fasting with recent readings 120-165. Julie Dawson says that when Julie Dawson first started the glipizide Julie Dawson had lower glucose readings around 80 when Julie Dawson ate light meals, but Julie Dawson has tried to make sure Julie Dawson eats regular meals with snacks throughout the day and has not experienced sugars below 120 recently.  Denies tremor, diaphoresis, polyuria, polydipsia, polyphagia. Most recent A1c was in September 2018- 7.6% Julie Dawson has noticed all of her fingers feel numb intermittently over the past few weeks and wonders if it is related to her diabetes. Julie Dawson only notices this occasionally when Julie Dawson first wakes in the morning. Julie Dawson has not noticed any pain or weakness.  Cholesterol- maintained on atorvastatin- until about one month ago Julie Dawson ran out of refills Julie Dawson says Julie Dawson tolerates the atorvastatin well and does not note any adverse medications effects including myalgias.  Lab Results  Component Value Date   CHOL 245 (A) 02/16/2017   HDL 41 02/16/2017   LDLCALC 173 02/16/2017   TRIG 155 02/16/2017   CHOLHDL 6 05/24/2016   Smoking-  This is a chronic problem Julie Dawson has been a smoker since age 57 Julie Dawson smokes 1 pack per day Julie Dawson did stop smoking cold [place] one time years ago but resumed. Julie Dawson has not tried any  medical therapies for smoking before Julie Dawson is interested in chantix after hearing that it was covered by her insurance at a work cessation counseling session.   Patient Active Problem List   Diagnosis Date Noted  . Type 2 diabetes mellitus with complication, without long-term current use of insulin (HCC) 08/23/2016  . 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 INSOMNIA 12/31/2009  . HERPES LABIALIS 01/21/2009  . ONYCHOMYCOSIS 07/29/2008  . HYPERCHOLESTEROLEMIA 07/29/2008  . DENTAL CARIES 04/21/2008  . OBESITY 03/13/2008  . TOBACCO ABUSE 03/13/2008  . MENOPAUSAL SYNDROME 03/13/2008  . HEADACHE 03/13/2008  . DRUG ABUSE, HX OF 03/13/2008    Past Surgical History:  Procedure Laterality Date  . cervical adenitis node remova    . ECTOPIC PREGNANCY SURGERY    . MOUTH SURGERY     for wisdom teeth removal    Family History  Problem Relation Age of Onset  . Diabetes Mother   . Hypertension Mother   . COPD Mother   . Stroke Father   . Diabetes Father   . Hypertension Father   . Diabetes Sister   . Drug abuse Brother   . Alcohol abuse Sister   . Colon cancer Neg Hx   . Colon polyps Neg Hx   . Esophageal cancer Neg Hx   . Rectal cancer Neg Hx   . Stomach cancer Neg Hx     Social History   Socioeconomic  History  . Marital status: Single    Spouse name: Not on file  . Number of children: Not on file  . Years of education: Not on file  . Highest education level: Not on file  Social Needs  . Financial resource strain: Not on file  . Food insecurity - worry: Not on file  . Food insecurity - inability: Not on file  . Transportation needs - medical: Not on file  . Transportation needs - non-medical: Not on file  Occupational History  . Not on file  Tobacco Use  . Smoking status: Current Every Day Smoker    Packs/day: 1.00    Types: Cigarettes  . Smokeless tobacco: Never Used  . Tobacco comment: PT REQUESTING PATCH   Substance and Sexual Activity  . Alcohol use: No  . 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  . Sexual activity: Yes    Birth control/protection: Post-menopausal  Other Topics Concern  . Not on file  Social History Narrative  . Not on file     Current Outpatient Medications:  .  acyclovir (ZOVIRAX) 400 MG tablet, Take 400 mg by mouth as needed., Disp: , Rfl:  .  glipiZIDE (GLUCOTROL) 5 MG tablet, Take 1 tablet (5 mg total) by mouth daily before breakfast. Need office visit for additional refills, Disp: 90 tablet, Rfl: 0 .  glucose blood (ACCU-CHEK AVIVA) test strip, Check blood sugar once daily before breakfast. E11.8, Disp: 100 each, Rfl: 3 .  ibuprofen (ADVIL,MOTRIN) 200 MG tablet, Take 200 mg by mouth every 6 (six) hours as needed for moderate pain., Disp: , Rfl:  .  Lancets (ACCU-CHEK SOFT TOUCH) lancets, Check blood sugar once daily before breakfast. E11.8, Disp: 100 each, Rfl: 3 .  lisinopril (PRINIVIL,ZESTRIL) 2.5 MG tablet, Take 1 tablet (2.5 mg total) by mouth daily., Disp: 30 tablet, Rfl: 0 .  atorvastatin (LIPITOR) 20 MG tablet, Take 1 tablet (20 mg total) by mouth daily at 6 PM. (Patient not taking: Reported on 01/24/2017), Disp: 90 tablet, Rfl: 1  No Known Allergies   ROS  See HPI  Objective  Vitals:   07/13/17 0938  BP: 110/80  Pulse: 73  Resp: 16  Temp: 98.7 F (37.1 C)  TempSrc: Oral  SpO2: 97%  Weight: 167 lb (75.8 kg)  Height: 5\' 3"  (1.6 m)    Body mass index is 29.58 kg/m.  Physical Exam Vital signs reviewed. Constitutional: Patient appears well-developed and well-nourished. No distress.  HENT: Head: Normocephalic and atraumatic. Nose: Nose normal. Mouth/Throat: Oropharynx is clear and moist.  Eyes: Conjunctivae and EOM are normal. Pupils are equal, round, and reactive to light. No scleral icterus.  Neck: Normal range of motion. Neck supple. Cardiovascular: Normal rate, regular rhythm and normal heart sounds. No BLE edema.  Distal pulses intact. Pulmonary/Chest: Effort normal and breath sounds normal. No respiratory distress. Musculoskeletal: Normal range of motion, no joint effusions. No gross deformities Neurological: Julie Dawson is alert and oriented to person, place, and time. No cranial nerve deficit. Coordination, balance, strength, speech and gait are normal.  Skin: Skin is warm and dry. No rash noted. No erythema.  Psychiatric: Patient has a normal mood and affect. behavior is normal. Judgment and thought content normal.  Assessment & Plan RTC in 1 month for follow up of smoking cessation- starting chantix, numbness  -Reviewed Health Maintenance: interested in cologuard but will call insurance to check coverage  Paresthesia RTC in 1 month for follow up or sooner if  worsening symptoms- will consider further testing, treatment or referral if symptoms persist - Comprehensive metabolic panel; Future - Hemoglobin A1c; Future - Vitamin B12 - TSH; Future

## 2017-07-13 NOTE — Patient Instructions (Addendum)
Call insurance about cologuard and let us know.  Please head downstairs for lab work.  I am proud of you for making the decision to quit smoking!! I have sent the prescription for chantix. On Days 1 to 3, you will take 0.5 mg once daily On Days 4 to 7, you will take  0.5 mg twice daily Then after Day 7, you will take 1 mg twice daily for up to 11 weeks. You should plan to quite smoking 2 weeks after starting the chantix. The chantix will come in 2 packs, one starter pack for days 1-7, then a continuing pack with refills to start on day 8. It will also be helpful to get a stress ball or something else that you can use to keep your hands busy when you get the urge to smoke. The chantix does make people feel very nauseated at first, but this will get better after a few days. Also, some people say that it makes them have crazy dreams, but no nightmares or bad dreams. I'd like for you to come in to see me in about 1 month, to make sure you are doing okay on the chantix and see how you are doing with quitting smoking. 1-800-QUIT-NOW can also offer daily talk therapy or other help if you need.  It was good to meet you. Thanks for letting me take care of you today :)

## 2017-07-13 NOTE — Assessment & Plan Note (Signed)
Update labs Will give instructions to resume statin pending lab results - Lipid panel; Future

## 2017-07-13 NOTE — Assessment & Plan Note (Addendum)
Continue medication at current dosages Will update labs today and follow up if any medication changes are needed We also discussed importance of eating three balanced meals a day on glipizde-we may need to consider alternate medication options if she experiences recurrent low blood sugars She is interested in diabetic education. - Comprehensive metabolic panel; Future - Hemoglobin A1c; Future - Amb Referral to Nutrition and Diabetic E

## 2017-07-13 NOTE — Assessment & Plan Note (Signed)
Trial of chantix. Common side effects including rare risk of suicide ideation was discussed with the patient today.  Patient is instructed to go directly to the ED if this occurs.   Asked about tobacco use. She was advised to quit. Assess willingness to make an attempt: She is motivated and ready to quit. Assist in quit attempt: We discussed both medication options and behavoiral modifications. 1-800-QUIT-NOW (daily talk therapy); squeeze ball, juggling change, paper log of # spent She would like to try chantix-Side effects and medication dosing discussed- including nausea and vivid dreams-nausea should improve over time- start chantix then quit in 2 weeks (or decrease by half) Medications ordered: - varenicline (CHANTIX) 0.5 MG tablet; Take 1 tablet (0.5 mg total) by mouth 2 (two) times daily. Take 1 tablet once daily day 1-3, then 1 tablet twice daily day 4-7  Dispense: 11 tablet; Refill: 0 - varenicline (CHANTIX CONTINUING MONTH PAK) 1 MG tablet; Take 1 tablet (1 mg total) by mouth 2 (two) times daily.  Dispense: 60 tablet; Refill: 2 Pt education handout given. 3 minutes were spent counseling on smoking risk factors and going over medications for assistance. Arrange follow up: She will RTC in about 1 month for follow up or sooner if needed

## 2017-07-15 ENCOUNTER — Other Ambulatory Visit: Payer: Self-pay | Admitting: Family

## 2017-07-15 DIAGNOSIS — E7800 Pure hypercholesterolemia, unspecified: Secondary | ICD-10-CM

## 2017-07-15 MED ORDER — ATORVASTATIN CALCIUM 20 MG PO TABS: 20.0000 mg | ORAL_TABLET | Freq: Every day | ORAL | 1 refills | Status: DC

## 2017-07-19 ENCOUNTER — Telehealth: Payer: Self-pay | Admitting: Nurse Practitioner

## 2017-07-19 NOTE — Telephone Encounter (Signed)
Please advise on lab results.

## 2017-07-19 NOTE — Telephone Encounter (Signed)
Copied from CRM [phone]. Topic: Quick Communication - Lab Results >> Jul 19, 2017 10:21 AM Stephannie LiSimmons, Ambreen Tufte L, NT wrote: Patient would like a follow up call regarding her office visit on 07/13/17 in regards to possible in medication and also lab results , please call her at [phone] ,

## 2017-07-20 ENCOUNTER — Other Ambulatory Visit: Payer: Self-pay | Admitting: Nurse Practitioner

## 2017-07-20 ENCOUNTER — Other Ambulatory Visit (INDEPENDENT_AMBULATORY_CARE_PROVIDER_SITE_OTHER): Payer: BC Managed Care – PPO

## 2017-07-20 DIAGNOSIS — R202 Paresthesia of skin: Secondary | ICD-10-CM

## 2017-07-20 DIAGNOSIS — E118 Type 2 diabetes mellitus with unspecified complications: Secondary | ICD-10-CM

## 2017-07-20 LAB — VITAMIN B12: VITAMIN B 12: 494 pg/mL (ref 211–911)

## 2017-07-20 LAB — MAGNESIUM: MAGNESIUM: 2.2 mg/dL (ref 1.5–2.5)

## 2017-07-20 MED ORDER — CANAGLIFLOZIN 100 MG PO TABS: 100.0000 mg | ORAL_TABLET | Freq: Every day | ORAL | 3 refills | Status: DC

## 2017-07-20 NOTE — Telephone Encounter (Signed)
Lab results have been sent to patient

## 2017-07-20 NOTE — Telephone Encounter (Signed)
Pt returning call for lab results. NT busy x4 minutes. Pt requesting call back [phone].

## 2017-07-20 NOTE — Telephone Encounter (Signed)
Patient was not happy she had not heard about her labs yet- patient informed of lab results- she  is concerned about vitamin deficiency and she wants to know if her B12 can be tested.

## 2017-07-21 ENCOUNTER — Telehealth: Payer: Self-pay

## 2017-07-21 ENCOUNTER — Encounter: Payer: Self-pay | Admitting: Nurse Practitioner

## 2017-07-21 NOTE — Telephone Encounter (Signed)
PA for Invokana has been initiated.   KEY: [id number]

## 2017-07-24 ENCOUNTER — Telehealth: Payer: Self-pay | Admitting: Nurse Practitioner

## 2017-07-24 MED ORDER — EMPAGLIFLOZIN 10 MG PO TABS: 10.0000 mg | ORAL_TABLET | Freq: Every day | ORAL | 1 refills | Status: DC

## 2017-07-24 NOTE — Telephone Encounter (Signed)
Copied from CRM [phone]#[id number]. Topic: Quick Communication - See Telephone Encounter >> Jul 24, 2017 10:38 AM Windy KalataMichael, Earlean Fidalgo L, NT wrote: CRM for notification. See Telephone encounter for: 07/24/17.  Patient is calling and states 2 of her RX is too expensive she is needing something cheaper.   atorvastatin (LIPITOR) 20 MG tablet canagliflozin (INVOKANA) 100 MG TABS tablet   She would like to switch Lipitor back to atorvastatin or something that is similar. And then something different for Invokana. Please contact patient once something else is called in.  [facility] [address] [place], [place] - [id number]  [address]  [place] [id number]  Phone: [phone] Fax: [phone]

## 2017-07-24 NOTE — Addendum Note (Signed)
Addended by: Evaristo BurySHAMBLEY, Nataniel Gasper N on: 07/24/2017 12:24 PM   Modules accepted: Orders

## 2017-07-24 NOTE — Telephone Encounter (Signed)
Also sent mychart message.

## 2017-07-24 NOTE — Telephone Encounter (Signed)
Can we send jardiance 10mg  once daily- 30 pills with one refill in place of the invokana. Please make sure she stops her glipizide, MONITOR blood sugars and notify for low readings <80, high readings >200, and returns in about 1 month for follow up.

## 2017-07-24 NOTE — Telephone Encounter (Signed)
LVM for pt to call back about the changes below.

## 2017-07-24 NOTE — Addendum Note (Signed)
Addended by: Mercer PodWRENN, Shane Melby E on: 07/24/2017 01:26 PM   Modules accepted: Orders

## 2017-07-24 NOTE — Telephone Encounter (Signed)
PA states that pt should try alternatives first. Either ComorosFarxiga or Jardiance. I did look on pts medication history and it does not look like she has tried either of these. Please advise on alternative.

## 2017-07-24 NOTE — Telephone Encounter (Signed)
Please advise on med changes.

## 2017-07-24 NOTE — Telephone Encounter (Signed)
I have sent instructions for invokana in prior message. Can you please let her know the cost of the lipitor- $9 a month and see if this is affordable.

## 2017-08-08 ENCOUNTER — Ambulatory Visit: Payer: BC Managed Care – PPO | Admitting: Registered"

## 2017-08-10 ENCOUNTER — Encounter: Payer: Self-pay | Admitting: Nurse Practitioner

## 2017-08-14 ENCOUNTER — Other Ambulatory Visit: Payer: Self-pay

## 2017-08-16 ENCOUNTER — Other Ambulatory Visit: Payer: Self-pay

## 2017-08-16 DIAGNOSIS — Z1239 Encounter for other screening for malignant neoplasm of breast: Secondary | ICD-10-CM

## 2017-08-18 ENCOUNTER — Other Ambulatory Visit: Payer: Self-pay | Admitting: Nurse Practitioner

## 2017-08-18 DIAGNOSIS — Z1231 Encounter for screening mammogram for malignant neoplasm of breast: Secondary | ICD-10-CM

## 2017-08-22 ENCOUNTER — Ambulatory Visit: Payer: BC Managed Care – PPO | Admitting: Registered"

## 2017-08-22 ENCOUNTER — Other Ambulatory Visit: Payer: Self-pay

## 2017-08-22 DIAGNOSIS — R202 Paresthesia of skin: Secondary | ICD-10-CM

## 2017-08-25 ENCOUNTER — Encounter: Payer: Self-pay | Admitting: Neurology

## 2017-08-31 ENCOUNTER — Encounter: Payer: Self-pay | Admitting: Nurse Practitioner

## 2017-09-01 ENCOUNTER — Other Ambulatory Visit: Payer: Self-pay

## 2017-09-01 DIAGNOSIS — E118 Type 2 diabetes mellitus with unspecified complications: Secondary | ICD-10-CM

## 2017-09-01 DIAGNOSIS — E7800 Pure hypercholesterolemia, unspecified: Secondary | ICD-10-CM

## 2017-09-01 MED ORDER — LISINOPRIL 2.5 MG PO TABS
2.5000 mg | ORAL_TABLET | Freq: Every day | ORAL | 1 refills | Status: DC
Start: 2017-09-01 — End: 2017-10-06

## 2017-09-01 MED ORDER — ATORVASTATIN CALCIUM 20 MG PO TABS: 20.0000 mg | ORAL_TABLET | Freq: Every day | ORAL | 1 refills | Status: DC

## 2017-09-14 ENCOUNTER — Ambulatory Visit: Payer: BC Managed Care – PPO

## 2017-09-22 ENCOUNTER — Encounter: Payer: Self-pay | Admitting: Nurse Practitioner

## 2017-09-22 ENCOUNTER — Encounter (HOSPITAL_COMMUNITY): Payer: Self-pay | Admitting: Emergency Medicine

## 2017-09-22 ENCOUNTER — Emergency Department (HOSPITAL_COMMUNITY)
Admission: EM | Admit: 2017-09-22 | Discharge: 2017-09-22 | Disposition: A | Discharge status: Home / Self Care | Payer: BC Managed Care – PPO | Attending: Emergency Medicine | Admitting: Emergency Medicine

## 2017-09-22 DIAGNOSIS — T783XXA Angioneurotic edema, initial encounter: Secondary | ICD-10-CM | POA: Diagnosis not present

## 2017-09-22 DIAGNOSIS — T464X5A Adverse effect of angiotensin-converting-enzyme inhibitors, initial encounter: Secondary | ICD-10-CM

## 2017-09-22 DIAGNOSIS — Z79899 Other long term (current) drug therapy: Secondary | ICD-10-CM | POA: Insufficient documentation

## 2017-09-22 DIAGNOSIS — E119 Type 2 diabetes mellitus without complications: Secondary | ICD-10-CM | POA: Insufficient documentation

## 2017-09-22 DIAGNOSIS — Z7984 Long term (current) use of oral hypoglycemic drugs: Secondary | ICD-10-CM | POA: Diagnosis not present

## 2017-09-22 DIAGNOSIS — F17210 Nicotine dependence, cigarettes, uncomplicated: Secondary | ICD-10-CM | POA: Diagnosis not present

## 2017-09-22 DIAGNOSIS — E785 Hyperlipidemia, unspecified: Secondary | ICD-10-CM | POA: Insufficient documentation

## 2017-09-22 DIAGNOSIS — R600 Localized edema: Secondary | ICD-10-CM | POA: Diagnosis present

## 2017-09-22 MED ORDER — DIPHENHYDRAMINE HCL 50 MG/ML IJ SOLN
25.0000 mg | Freq: Once | INTRAMUSCULAR | Status: AC
  Administered 2017-09-22: 25 mg via INTRAVENOUS
  Filled 2017-09-22: qty 1

## 2017-09-22 MED ORDER — PREDNISONE 20 MG PO TABS: ORAL_TABLET | ORAL | 0 refills | Status: DC

## 2017-09-22 MED ORDER — FAMOTIDINE IN NACL 20-0.9 MG/50ML-% IV SOLN
20.0000 mg | Freq: Once | INTRAVENOUS | Status: AC
  Administered 2017-09-22: 20 mg via INTRAVENOUS
  Filled 2017-09-22: qty 50

## 2017-09-22 MED ORDER — FAMOTIDINE 20 MG PO TABS: 20.0000 mg | ORAL_TABLET | Freq: Two times a day (BID) | ORAL | 0 refills | Status: DC

## 2017-09-22 MED ORDER — METHYLPREDNISOLONE SODIUM SUCC 125 MG IJ SOLR
125.0000 mg | Freq: Once | INTRAMUSCULAR | Status: AC
  Administered 2017-09-22: 125 mg via INTRAVENOUS
  Filled 2017-09-22: qty 2

## 2017-09-22 MED ORDER — SODIUM CHLORIDE 0.9 % IV SOLN
INTRAVENOUS | Status: DC
  Administered 2017-09-22: 02:00:00 via INTRAVENOUS

## 2017-09-22 NOTE — ED Notes (Signed)
Patient tongue swelling is reduced by >50%. Able to speak clearly at this time.

## 2017-09-22 NOTE — ED Provider Notes (Addendum)
[facility] EMERGENCY DEPARTMENT Provider Note   CSN: [id number] Arrival date & time: 09/22/17  0133     History   Chief Complaint Chief Complaint  Patient presents with  . Oral Swelling    HPI Julie Dawson is a 57 y.o. female.  The history is provided by the patient.  Allergic Reaction  Presenting symptoms: swelling   Presenting symptoms: no difficulty breathing, no difficulty swallowing, no itching and no rash   Severity:  Moderate Prior allergic episodes:  No prior episodes Context: medications   Context: not animal exposure   Context comment:  Lisinopril, she took it this evening Relieved by:  Nothing Worsened by:  Nothing Ineffective treatments:  None tried   Past Medical History:  Diagnosis Date  . Allergy    spring time only   . Constipation    doesnt use otc medicines  . Diabetes mellitus without complication (HCC)   . History of positive PPD    cannot do the tine test,  has to do CXR  . Hyperlipidemia     Patient Active Problem List   Diagnosis Date Noted  . Cigarette nicotine dependence with nicotine-induced disorder 07/13/2017  . Type 2 diabetes mellitus with complication, without long-term current use of insulin (HCC) 08/23/2016  . 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 INSOMNIA 12/31/2009  . HERPES LABIALIS 01/21/2009  . ONYCHOMYCOSIS 07/29/2008  . HYPERCHOLESTEROLEMIA 07/29/2008  . DENTAL CARIES 04/21/2008  . OBESITY 03/13/2008  . MENOPAUSAL SYNDROME 03/13/2008  . HEADACHE 03/13/2008  . DRUG ABUSE, HX OF 03/13/2008    Past Surgical History:  Procedure Laterality Date  . cervical adenitis node remova    . ECTOPIC PREGNANCY SURGERY    . MOUTH SURGERY     for wisdom teeth removal     OB History   None      Home Medications    Prior to Admission medications   Medication Sig Start Date End Date Taking? Authorizing Provider  acyclovir (ZOVIRAX) 400 MG  tablet Take 400 mg by mouth as needed.    [provider]  atorvastatin (LIPITOR) 20 MG tablet Take 1 tablet (20 mg total) by mouth daily at 6 PM. 09/01/17   Shambley, Audie Box, NP  empagliflozin (JARDIANCE) 10 MG TABS tablet Take 10 mg by mouth daily. 07/24/17   Evaristo Bury, NP  glucose blood (ACCU-CHEK AVIVA) test strip Check blood sugar once daily before breakfast. E11.8 02/07/17   Nche, Bonna Gains, NP  ibuprofen (ADVIL,MOTRIN) 200 MG tablet Take 200 mg by mouth every 6 (six) hours as needed for moderate pain.    [provider]  Lancets (ACCU-CHEK SOFT TOUCH) lancets Check blood sugar once daily before breakfast. E11.8 02/07/17   Nche, Bonna Gains, NP  lisinopril (PRINIVIL,ZESTRIL) 2.5 MG tablet Take 1 tablet (2.5 mg total) by mouth daily. 09/01/17   Evaristo Bury, NP  varenicline (CHANTIX CONTINUING MONTH PAK) 1 MG tablet Take 1 tablet (1 mg total) by mouth 2 (two) times daily. 07/13/17   Evaristo Bury, NP  varenicline (CHANTIX) 0.5 MG tablet Take 1 tablet (0.5 mg total) by mouth 2 (two) times daily. Take 1 tablet daily on days 1-3, then twice daily on days 4-7 07/13/17   Evaristo Bury, NP    Family History Family History  Problem Relation Age of Onset  . Diabetes Mother   . Hypertension Mother   . COPD Mother   .  Stroke Father   . Diabetes Father   . Hypertension Father   . Diabetes Sister   . Drug abuse Brother   . Alcohol abuse Sister   . Colon cancer Neg Hx   . Colon polyps Neg Hx   . Esophageal cancer Neg Hx   . Rectal cancer Neg Hx   . Stomach cancer Neg Hx     Social History Social History   Tobacco Use  . Smoking status: Current Every Day Smoker    Packs/day: 1.00    Types: Cigarettes  . Smokeless tobacco: Never Used  . Tobacco comment: PT REQUESTING PATCH  Substance Use Topics  . Alcohol use: No  . 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     Allergies   Ace inhibitors and Metformin and  related   Review of Systems Review of Systems  HENT: Negative for drooling, sore throat, trouble swallowing and voice change.        Tongue swelling  Respiratory: Negative for choking, chest tightness and shortness of breath.   Cardiovascular: Negative for chest pain.  Musculoskeletal: Negative for neck pain.  Skin: Negative for color change, itching and rash.  Neurological: Negative for speech difficulty, light-headedness and numbness.  All other systems reviewed and are negative.    Physical Exam Updated Vital Signs BP 127/84   Pulse 88   Temp 98.1 F (36.7 C) (Oral)   Resp 18   SpO2 99%   Physical Exam  Constitutional: She is oriented to person, place, and time. She appears well-developed and well-nourished. No distress.  HENT:  Head: Normocephalic and atraumatic.  Mouth/Throat: No oropharyngeal exudate.  Right side of the tongue swollen, no swelling of the lips or uvula  Eyes: Pupils are equal, round, and reactive to light. Conjunctivae are normal.  Neck: Normal range of motion. Neck supple.  Cardiovascular: Normal rate, regular rhythm, normal heart sounds and intact distal pulses.  Pulmonary/Chest: Effort normal and breath sounds normal. No stridor. No respiratory distress. She has no wheezes. She has no rales.  Abdominal: Soft. Bowel sounds are normal. She exhibits no mass. There is no tenderness. There is no rebound and no guarding.  Musculoskeletal: Normal range of motion.  Neurological: She is alert and oriented to person, place, and time. She displays normal reflexes.  Skin: Skin is warm and dry. Capillary refill takes less than 2 seconds.  Psychiatric: She has a normal mood and affect.     ED Treatments / Results  Labs (all labs ordered are listed, but only abnormal results are displayed) Labs Reviewed - No data to display  EKG None  Radiology No results found.  Procedures Procedures (including critical care time)  Medications Ordered in  ED Medications  0.9 %  sodium chloride infusion ( Intravenous New Bag/Given 09/22/17 0200)  methylPREDNISolone sodium succinate (SOLU-MEDROL) 125 mg/2 mL injection 125 mg (125 mg Intravenous Given 09/22/17 0156)  famotidine (PEPCID) IVPB 20 mg premix (0 mg Intravenous Stopped 09/22/17 0311)  diphenhydrAMINE (BENADRYL) injection 25 mg (25 mg Intravenous Given 09/22/17 0156)      Final Clinical Impressions(s) / ED Diagnoses   Have listed Ace-Inhibitors as an allergy.  Call your doctor to have them list this as an allergy and to start you on a new BP medication.  Patient and husband verbalize understanding and agree to follow up.   Return for weakness, numbness, changes in vision or speech, fevers >100.4 unrelieved by medication, shortness of breath, intractable vomiting, or  diarrhea, abdominal pain, Inability to tolerate liquids or food, cough, altered mental status or any concerns. No signs of systemic illness or infection. The patient is nontoxic-appearing on exam and vital signs are within normal limits.   I have reviewed the triage vital signs and the nursing notes. Pertinent labs &imaging results that were available during my care of the patient were reviewed by me and considered in my medical decision making (see chart for details).  After history, exam, and medical workup I feel the patient has been appropriately medically screened and is safe for discharge home. Pertinent diagnoses were discussed with the patient. Patient was given return precautions.       Jayvian Escoe, MD 09/22/17 1610    Cy Blamer, MD 09/22/17 9604

## 2017-09-22 NOTE — ED Triage Notes (Signed)
Pt states she woke up this morning and her tongue was swollen. Pt does take lisinopril. Denies SOB but does report some trouble swallowing.

## 2017-09-30 ENCOUNTER — Encounter: Payer: Self-pay | Admitting: Nurse Practitioner

## 2017-09-30 ENCOUNTER — Other Ambulatory Visit: Payer: Self-pay | Admitting: Nurse Practitioner

## 2017-10-05 ENCOUNTER — Encounter: Payer: Self-pay | Admitting: Nurse Practitioner

## 2017-10-05 ENCOUNTER — Telehealth: Payer: Self-pay | Admitting: Nurse Practitioner

## 2017-10-05 NOTE — Telephone Encounter (Signed)
Messaged patient via my-chart to let her know it was okay for her to fast prior to coming in for labs in the morning.

## 2017-10-05 NOTE — Telephone Encounter (Signed)
Copied from CRM [id number]. Topic: Quick Communication - See Telephone Encounter >> Oct 05, 2017  9:20 AM Diana EvesHoyt, Maryann B wrote: CRM for notification. See Telephone encounter for: 10/05/17.  Pt has an appt 10/06/17 with Dayton ScrapeMurray. It is regarding her blood sugar readings that are slightly elevated. Today's was 191. She is wanting to know if she should come fasting tomorrow. She asked to be messaged via [employer]

## 2017-10-06 ENCOUNTER — Other Ambulatory Visit (INDEPENDENT_AMBULATORY_CARE_PROVIDER_SITE_OTHER): Payer: BC Managed Care – PPO

## 2017-10-06 ENCOUNTER — Ambulatory Visit: Payer: BC Managed Care – PPO | Admitting: Family

## 2017-10-06 ENCOUNTER — Encounter: Payer: Self-pay | Admitting: Family

## 2017-10-06 VITALS — BP 124/80 | HR 79 | Temp 98.1°F | Ht 63.0 in | Wt 161.1 lb

## 2017-10-06 DIAGNOSIS — Z720 Tobacco use: Secondary | ICD-10-CM | POA: Diagnosis not present

## 2017-10-06 DIAGNOSIS — R739 Hyperglycemia, unspecified: Secondary | ICD-10-CM

## 2017-10-06 DIAGNOSIS — E119 Type 2 diabetes mellitus without complications: Secondary | ICD-10-CM

## 2017-10-06 LAB — URINALYSIS
BILIRUBIN URINE: NEGATIVE
Hgb urine dipstick: NEGATIVE
KETONES UR: NEGATIVE
Leukocytes, UA: NEGATIVE
Nitrite: NEGATIVE
PH: 5.5 (ref 5.0–8.0)
SPECIFIC GRAVITY, URINE: 1.015 (ref 1.000–1.030)
Total Protein, Urine: NEGATIVE
Urobilinogen, UA: 0.2 (ref 0.0–1.0)

## 2017-10-06 LAB — COMPREHENSIVE METABOLIC PANEL
ALT: 17 U/L (ref 0–35)
AST: 13 U/L (ref 0–37)
Albumin: 4.3 g/dL (ref 3.5–5.2)
Alkaline Phosphatase: 102 U/L (ref 39–117)
BILIRUBIN TOTAL: 0.6 mg/dL (ref 0.2–1.2)
BUN: 21 mg/dL (ref 6–23)
CHLORIDE: 106 meq/L (ref 96–112)
CO2: 24 meq/L (ref 19–32)
CREATININE: 1.16 mg/dL (ref 0.40–1.20)
Calcium: 9.4 mg/dL (ref 8.4–10.5)
GFR: 62 mL/min (ref >60.00)
GLUCOSE: 128 mg/dL — AB (ref 70–99)
Potassium: 4 mEq/L (ref 3.5–5.1)
SODIUM: 140 meq/L (ref 135–145)
Total Protein: 7.4 g/dL (ref 6.0–8.3)

## 2017-10-06 LAB — CBC WITH DIFFERENTIAL/PLATELET
BASOS PCT: 1.5 % (ref 0.0–3.0)
Basophils Absolute: 0.2 10*3/uL — ABNORMAL HIGH (ref 0.0–0.1)
EOS ABS: 0.1 10*3/uL (ref 0.0–0.7)
Eosinophils Relative: 0.9 % (ref 0.0–5.0)
HCT: 41.6 % (ref 36.0–46.0)
Hemoglobin: 14 g/dL (ref 12.0–15.0)
LYMPHS ABS: 4.5 10*3/uL — AB (ref 0.7–4.0)
Lymphocytes Relative: 41.4 % (ref 12.0–46.0)
MCHC: 33.8 g/dL (ref 30.0–36.0)
MCV: 82.5 fl (ref 78.0–100.0)
Monocytes Absolute: 0.7 10*3/uL (ref 0.1–1.0)
Monocytes Relative: 6.8 % (ref 3.0–12.0)
NEUTROS ABS: 5.3 10*3/uL (ref 1.4–7.7)
NEUTROS PCT: 49.4 % (ref 43.0–77.0)
PLATELETS: 220 10*3/uL (ref 150.0–400.0)
RBC: 5.04 Mil/uL (ref 3.87–5.11)
RDW: 14.2 % (ref 11.5–15.5)
WBC: 10.8 10*3/uL — ABNORMAL HIGH (ref 4.0–10.5)

## 2017-10-06 LAB — HEMOGLOBIN A1C: HEMOGLOBIN A1C: 7.2 % — AB (ref 4.6–6.5)

## 2017-10-06 NOTE — Progress Notes (Signed)
Julie Dawson is a 57 y.o. female with the following history as recorded in EpicCare:  Patient Active Problem List   Diagnosis Date Noted  . Cigarette nicotine dependence with nicotine-induced disorder 07/13/2017  . Type 2 diabetes mellitus with complication, without long-term current use of insulin ([place]) 08/23/2016  . 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 [place] SKIN 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 06/08/2010  . INSOMNIA 12/31/2009  . HERPES LABIALIS 01/21/2009  . ONYCHOMYCOSIS 07/29/2008  . HYPERCHOLESTEROLEMIA 07/29/2008  . DENTAL CARIES 04/21/2008  . OBESITY 03/13/2008  . MENOPAUSAL SYNDROME 03/13/2008  . HEADACHE 03/13/2008  . DRUG ABUSE, HX OF 03/13/2008    Current Outpatient Medications  Medication Sig Dispense Refill  . acyclovir (ZOVIRAX) 400 MG tablet Take 400 mg by mouth as needed.    Marland Kitchen atorvastatin (LIPITOR) 20 MG tablet Take 1 tablet (20 mg total) by mouth daily at 6 PM. 90 tablet 1  . glucose blood (ACCU-CHEK AVIVA) test strip Check blood sugar once daily before breakfast. E11.8 100 each 3  . ibuprofen (ADVIL,MOTRIN) 200 MG tablet Take 200 mg by mouth every 6 (six) hours as needed for moderate pain.    Marland Kitchen JARDIANCE 10 MG TABS tablet TAKE 1 TABLET BY MOUTH ONCE DAILY 30 tablet 1  . Lancets (ACCU-CHEK SOFT TOUCH) lancets Check blood sugar once daily before breakfast. E11.8 100 each 3  . varenicline (CHANTIX CONTINUING MONTH PAK) 1 MG tablet Take 1 tablet (1 mg total) by mouth 2 (two) times daily. 60 tablet 2  . varenicline (CHANTIX) 0.5 MG tablet Take 1 tablet (0.5 mg total) by mouth 2 (two) times daily. Take 1 tablet daily on days 1-3, then twice daily on days 4-7 11 tablet 0  . lisinopril (PRINIVIL,ZESTRIL) 2.5 MG tablet Take 1 tablet (2.5 mg total) by mouth daily. (Patient not taking: Reported on 10/06/2017) 90 tablet 1   No current facility-administered medications for this visit.     Allergies: Ace inhibitors and Metformin and related  Past Medical History:   Diagnosis Date  . Allergy    spring time only   . Constipation    doesnt use otc medicines  . Diabetes mellitus without complication ([place])   . History of positive PPD    cannot do the tine test,  has to do CXR  . Hyperlipidemia     Past Surgical History:  Procedure Laterality Date  . cervical adenitis node remova    . ECTOPIC PREGNANCY SURGERY    . MOUTH SURGERY     for wisdom teeth removal    Family History  Problem Relation Age of Onset  . Diabetes Mother   . Hypertension Mother   . COPD Mother   . Stroke Father   . Diabetes Father   . Hypertension Father   . Diabetes Sister   . Drug abuse Brother   . Alcohol abuse Sister   . Colon cancer Neg Hx   . Colon polyps Neg Hx   . Esophageal cancer Neg Hx   . Rectal cancer Neg Hx   . Stomach cancer Neg Hx     Social History   Tobacco Use  . Smoking status: Current Every Day Smoker    Packs/day: 1.00    Types: Cigarettes  . Smokeless tobacco: Never Used  . Tobacco comment: PT REQUESTING PATCH  Substance Use Topics  . Alcohol use: No    Subjective:  Patient presents with concerns for elevated blood sugar since having to take steroid pack for allergic reaction  to Lisinopril; was seen in ER on May 24 with allergic reaction to Lisinopril; had been doing well on Jardiance 10 mg daily until early this week; has been having sugars in the 200s since receiving IV Solu-Medrol and taking the oral prednisone; Notes that her sugars typically average in the 130s ( fasting) and very concerned about what is causing her blood sugars to stay high; Denies any chest pain, shortness of breath, blurred vision or headache;    Objective:  Vitals:   10/06/17 0918  BP: 124/80  Pulse: 79  Temp: 98.1 F (36.7 C)  TempSrc: Oral  SpO2: 99%  Weight: 161 lb 1.3 oz (73.1 kg)  Height: _0  (1.6 m)    General: Well developed, well nourished, in no acute distress  Skin : Warm and dry.  Head: Normocephalic and atraumatic  Eyes: Sclera and  conjunctiva clear; pupils round and reactive to light; extraocular movements intact  Ears: External normal; canals clear; tympanic membranes normal  Oropharynx: Pink, supple. No suspicious lesions  Neck: Supple without thyromegaly, adenopathy  Lungs: Respirations unlabored; clear to auscultation bilaterally without wheeze, rales, rhonchi  CVS exam: normal rate, regular rhythm, normal S1, S2, no murmurs, rubs, clicks or gallops.  Abdomen: Soft; nontender; nondistended; normoactive bowel sounds; no masses or hepatosplenomegaly  Musculoskeletal: No deformities; no active joint inflammation  Extremities: No edema, cyanosis, clubbing  Vessels: Symmetric bilaterally  Neurologic: Alert and oriented; speech intact; face symmetrical; moves all extremities well; CNII-XII intact without focal deficit  Assessment:  1. Type 2 diabetes mellitus without complication, without long-term current use of insulin (HCC)   2. Elevated blood sugar     Plan:  Suspect acute hyperglycemia due to recent use of IV and oral steroids; continue Jardiance 10 mg daily; will treat with basal insulin for the next 5-7 days to break insulin resistance; start with 4 units of Basaglar for today, tomorrow and Sunday; will check with patient on Monday to determine how to proceed; 1st injection is given in office today;   Discussed need to quit smoking- she admits she is more committed to quitting at this time; will plan to re-start Chantix once her blood sugars have returned to normal;   No follow-ups on file.  Orders Placed This Encounter  Procedures  . Urine Culture    Standing Status:   Future    Standing Expiration Date:   10/06/2018  . CBC w/Diff    Standing Status:   Future    Standing Expiration Date:   10/06/2018  . Comp Met (CMET)    Standing Status:   Future    Standing Expiration Date:   10/06/2018  . HgB A1c    Standing Status:   Future    Standing Expiration Date:   10/06/2018    Requested Prescriptions    No  prescriptions requested or ordered in this encounter

## 2017-10-07 LAB — URINE CULTURE
MICRO NUMBER:: 90686764
SPECIMEN QUALITY:: ADEQUATE

## 2017-10-09 ENCOUNTER — Telehealth: Payer: Self-pay | Admitting: Nurse Practitioner

## 2017-10-09 NOTE — Telephone Encounter (Signed)
Copied from CRM [phone]. Topic: Quick Communication - Lab Results >> Oct 09, 2017 10:27 AM Julie Dawson, Julie Dawson wrote: Patient would like a call back about her labs that have been release to her mychart. Patient does not understand them and would like a verbal read to her. Please call patient back, thanks.

## 2017-10-09 NOTE — Telephone Encounter (Signed)
Looks like Julie Dawson saw her on Friday and sent results note this am

## 2017-10-10 ENCOUNTER — Telehealth: Payer: Self-pay | Admitting: Nurse Practitioner

## 2017-10-10 NOTE — Telephone Encounter (Signed)
Copied from CRM [phone]. Topic: Quick Communication - See Telephone Encounter >> Oct 10, 2017 10:54 AM Raquel SarnaHayes, Teresa G wrote:  Bjorn Loserhonda - Nurse [facility] [employer] - [phone]  Letting A. Shambley know: Pt is going to speak w/ a diabetic nurse coach regarding her diabetes. Pt hasn't had her flu or pneumonia shot.

## 2017-10-16 ENCOUNTER — Encounter: Payer: Self-pay | Admitting: Nurse Practitioner

## 2017-10-17 ENCOUNTER — Other Ambulatory Visit: Payer: Self-pay | Admitting: Family

## 2017-10-17 MED ORDER — FLUCONAZOLE 150 MG PO TABS: 150.0000 mg | ORAL_TABLET | Freq: Once | ORAL | 0 refills | Status: AC

## 2017-10-17 NOTE — Telephone Encounter (Signed)
I will send in the Diflucan for her as she is requesting. I want to verify that she is not needing the insulin anymore. Back on Jardiance only and sugars stable?

## 2017-10-22 ENCOUNTER — Encounter: Payer: Self-pay | Admitting: Nurse Practitioner

## 2017-10-24 ENCOUNTER — Encounter: Payer: Self-pay | Admitting: Nurse Practitioner

## 2017-10-26 ENCOUNTER — Ambulatory Visit: Payer: BC Managed Care – PPO | Admitting: Nurse Practitioner

## 2017-11-10 ENCOUNTER — Encounter: Payer: Self-pay | Admitting: Nurse Practitioner

## 2017-11-27 ENCOUNTER — Encounter: Payer: Self-pay | Admitting: Nurse Practitioner

## 2017-12-08 ENCOUNTER — Other Ambulatory Visit: Payer: Self-pay | Admitting: Nurse Practitioner

## 2017-12-08 ENCOUNTER — Encounter: Payer: Self-pay | Admitting: Nurse Practitioner

## 2017-12-25 ENCOUNTER — Ambulatory Visit: Payer: BC Managed Care – PPO | Admitting: Neurology

## 2018-01-16 ENCOUNTER — Encounter (HOSPITAL_COMMUNITY): Payer: Self-pay | Admitting: Emergency Medicine

## 2018-01-16 ENCOUNTER — Other Ambulatory Visit: Payer: Self-pay

## 2018-01-16 ENCOUNTER — Emergency Department (HOSPITAL_COMMUNITY)
Admission: EM | Admit: 2018-01-16 | Discharge: 2018-01-16 | Disposition: A | Discharge status: Home / Self Care | Payer: BC Managed Care – PPO | Attending: Emergency Medicine | Admitting: Emergency Medicine

## 2018-01-16 DIAGNOSIS — Z7984 Long term (current) use of oral hypoglycemic drugs: Secondary | ICD-10-CM | POA: Diagnosis not present

## 2018-01-16 DIAGNOSIS — Z79899 Other long term (current) drug therapy: Secondary | ICD-10-CM | POA: Insufficient documentation

## 2018-01-16 DIAGNOSIS — E119 Type 2 diabetes mellitus without complications: Secondary | ICD-10-CM | POA: Diagnosis not present

## 2018-01-16 DIAGNOSIS — E7800 Pure hypercholesterolemia, unspecified: Secondary | ICD-10-CM | POA: Diagnosis not present

## 2018-01-16 DIAGNOSIS — N898 Other specified noninflammatory disorders of vagina: Secondary | ICD-10-CM | POA: Diagnosis present

## 2018-01-16 DIAGNOSIS — E785 Hyperlipidemia, unspecified: Secondary | ICD-10-CM | POA: Diagnosis not present

## 2018-01-16 DIAGNOSIS — F17210 Nicotine dependence, cigarettes, uncomplicated: Secondary | ICD-10-CM | POA: Diagnosis not present

## 2018-01-16 LAB — URINALYSIS, ROUTINE W REFLEX MICROSCOPIC
BILIRUBIN URINE: NEGATIVE
Glucose, UA: >=500 mg/dL — AB
HGB URINE DIPSTICK: NEGATIVE
KETONES UR: NEGATIVE mg/dL
LEUKOCYTES UA: NEGATIVE
NITRITE: NEGATIVE
PROTEIN: NEGATIVE mg/dL
Specific Gravity, Urine: 1.014 (ref 1.005–1.030)
pH: 5 (ref 5.0–8.0)

## 2018-01-16 LAB — WET PREP, GENITAL
Clue Cells Wet Prep HPF POC: NONE SEEN
SPERM: NONE SEEN
Trich, Wet Prep: NONE SEEN
Yeast Wet Prep HPF POC: NONE SEEN

## 2018-01-16 NOTE — ED Notes (Signed)
Pt left without receiving discharge paperwork and without signing out

## 2018-01-16 NOTE — ED Triage Notes (Signed)
Patient c/o vaginal itching x 1 month. Reports taking prescribed medicine for yeast infection without relief. Reports using neosporin to area. Reports today she noticed her "whole vagina is white." Denies vaginal discharge.

## 2018-01-16 NOTE — ED Provider Notes (Signed)
[facility]-EMERGENCY DEPT Provider Note   CSN: [id number] Arrival date & time: 01/16/18  2003     History   Chief Complaint Chief Complaint  Patient presents with  . Vaginal Itching    HPI Julie Dawson is a 57 y.o. female with a past medical history of DM, history of drug abuse, who presents today for evaluation of vaginal itching.  She reports that she has had vaginal itching for approximately 1 month, her PCP reportedly called in medicine for a yeast infection which she took without significant relief.  She says that today she used a mere to "look down there for the first time and for ever."  She reports that she was concerned because "the skin was white and I am black."  She reports that she is concerned because she read that Jardiance can cause an infection of the peritoneum and is concerned she may have that.  Her symptoms have not worsened recently.  She reports that she has been using multiple different soaps, which have fragrance, and that she washes her entire body including her vulva with an exfoliating minute.  She has been putting Neosporin on the area.  She denies any vaginal discharge.    She is post menopausal.    HPI  Past Medical History:  Diagnosis Date  . Allergy    spring time only   . Constipation    doesnt use otc medicines  . Diabetes mellitus without complication (HCC)   . History of positive PPD    cannot do the tine test,  has to do CXR  . Hyperlipidemia     Patient Active Problem List   Diagnosis Date Noted  . Cigarette nicotine dependence with nicotine-induced disorder 07/13/2017  . Type 2 diabetes mellitus with complication, without long-term current use of insulin (HCC) 08/23/2016  . 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 INSOMNIA 12/31/2009  . HERPES LABIALIS 01/21/2009  . ONYCHOMYCOSIS 07/29/2008  . HYPERCHOLESTEROLEMIA 07/29/2008  . DENTAL CARIES 04/21/2008    . OBESITY 03/13/2008  . MENOPAUSAL SYNDROME 03/13/2008  . HEADACHE 03/13/2008  . DRUG ABUSE, HX OF 03/13/2008    Past Surgical History:  Procedure Laterality Date  . cervical adenitis node remova    . ECTOPIC PREGNANCY SURGERY    . MOUTH SURGERY     for wisdom teeth removal     OB History   None      Home Medications    Prior to Admission medications   Medication Sig Start Date End Date Taking? Authorizing Provider  atorvastatin (LIPITOR) 20 MG tablet Take 1 tablet (20 mg total) by mouth daily at 6 PM. 09/01/17  Yes Shambley, Audie BoxAshleigh N, NP  JARDIANCE 10 MG TABS tablet TAKE 1 TABLET BY MOUTH ONCE DAILY 12/08/17  Yes Evaristo BuryShambley, Ashleigh N, NP  glucose blood (ACCU-CHEK AVIVA) test strip Check blood sugar once daily before breakfast. E11.8 02/07/17   Nche, Bonna Gainsharlotte Lum, NP  Lancets (ACCU-CHEK SOFT TOUCH) lancets Check blood sugar once daily before breakfast. E11.8 02/07/17   Nche, Bonna Gainsharlotte Lum, NP  varenicline (CHANTIX CONTINUING MONTH PAK) 1 MG tablet Take 1 tablet (1 mg total) by mouth 2 (two) times daily. Patient not taking: Reported on 01/16/2018 07/13/17   Evaristo BuryShambley, Ashleigh N, NP  varenicline (CHANTIX) 0.5 MG tablet Take 1 tablet (0.5 mg total) by mouth 2 (two) times daily. Take 1 tablet daily on days 1-3, then twice daily on days 4-7 Patient  not taking: Reported on 01/16/2018 07/13/17   Evaristo Bury, NP    Family History Family History  Problem Relation Age of Onset  . Diabetes Mother   . Hypertension Mother   . COPD Mother   . Stroke Father   . Diabetes Father   . Hypertension Father   . Diabetes Sister   . Drug abuse Brother   . Alcohol abuse Sister   . Colon cancer Neg Hx   . Colon polyps Neg Hx   . Esophageal cancer Neg Hx   . Rectal cancer Neg Hx   . Stomach cancer Neg Hx     Social History Social History   Tobacco Use  . Smoking status: Current Every Day Smoker    Packs/day: 1.00    Types: Cigarettes  . Smokeless tobacco: Never Used  . Tobacco  comment: PT REQUESTING PATCH  Substance Use Topics  . Alcohol use: No  . 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     Allergies   Ace inhibitors; Lisinopril; and Metformin and related   Review of Systems Review of Systems  Constitutional: Negative for chills and fever.  HENT: Negative for congestion.   Respiratory: Negative for shortness of breath.   Gastrointestinal: Negative for abdominal pain, diarrhea, nausea and vomiting.  Genitourinary: Positive for vaginal pain. Negative for hematuria, menstrual problem, pelvic pain, urgency, vaginal bleeding and vaginal discharge.  All other systems reviewed and are negative.    Physical Exam Updated Vital Signs BP 123/85 (BP Location: Left Arm)   Pulse 92   Temp 98.1 F (36.7 C) (Oral)   Resp 15   Ht 5\' 3"  (1.6 m)   Wt 72.6 kg   SpO2 95%   BMI 28.34 kg/m   Physical Exam  Constitutional: She appears well-developed and well-nourished. No distress.  HENT:  Head: Normocephalic and atraumatic.  Eyes: Conjunctivae are normal. Right eye exhibits no discharge. Left eye exhibits no discharge. No scleral icterus.  Neck: Normal range of motion.  Cardiovascular: Normal rate and regular rhythm.  Pulmonary/Chest: Effort normal. No stridor. No respiratory distress.  Abdominal: She exhibits no distension.  Genitourinary:  Genitourinary Comments: Exam performed with RN chaperone in room.  The external vulva appears pale with thickened skin.  There is no vaginal drainage or blood in the vaginal canal.  There are no ulcers on external genitalia.  No Adenexal TTP or CMT.    Musculoskeletal: She exhibits no edema or deformity.  Neurological: She is alert. She exhibits normal muscle tone.  Skin: Skin is warm and dry. She is not diaphoretic.  Psychiatric: She has a normal mood and affect. Her behavior is normal.  Nursing note and vitals reviewed.    ED Treatments / Results  Labs (all labs ordered are listed, but only  abnormal results are displayed) Labs Reviewed  WET PREP, GENITAL - Abnormal; Notable for the following components:      Result Value   WBC, Wet Prep HPF POC FEW (*)    All other components within normal limits  URINALYSIS, ROUTINE W REFLEX MICROSCOPIC - Abnormal; Notable for the following components:   Color, Urine STRAW (*)    Glucose, UA >=500 (*)    Bacteria, UA RARE (*)    All other components within normal limits  GC/CHLAMYDIA PROBE AMP ([place]) NOT AT [facility]    EKG None  Radiology No results found.  Procedures Procedures (including critical care time)  Medications Ordered in ED Medications -  No data to display   Initial Impression / Assessment and Plan / ED Course  I have reviewed the triage vital signs and the nursing notes.  Pertinent labs & imaging results that were available during my care of the patient were reviewed by me and considered in my medical decision making (see chart for details).    Julie Dawson presents today for evaluation of 1 month of vaginal itching and discomfort along with concerns about color change.  She has previously been treated for yeast without significant relief.  Pelvic exam performed showing thickened, light skin on the vulva without vaginal discharge or drainage.  She has no abdominal pain.  Urine is not consistent with infection, wet prep does not show yeast, or clue cells.  We discussed using mild soaps, not using exfoliating scrub on vulva, and womens outpatient follow up.  When I went to inform patient of her results RN stated the patient had eloped.  Final Clinical Impressions(s) / ED Diagnoses   Final diagnoses:  Vaginal itching    ED Discharge Orders    None       Norman Clay 01/16/18 2338    Sabas Sous, MD 01/17/18 [phone]

## 2018-01-17 LAB — GC/CHLAMYDIA PROBE AMP (~~LOC~~) NOT AT ARMC
Chlamydia: NEGATIVE
NEISSERIA GONORRHEA: NEGATIVE

## 2018-01-19 ENCOUNTER — Other Ambulatory Visit: Payer: Self-pay | Admitting: *Deleted

## 2018-01-19 ENCOUNTER — Encounter: Payer: Self-pay | Admitting: Nurse Practitioner

## 2018-01-19 MED ORDER — GLUCOSE BLOOD VI STRP: 1.0000 | ORAL_STRIP | Freq: Every day | 1 refills | Status: DC

## 2018-01-19 NOTE — Telephone Encounter (Signed)
I called pt- she states she needs the lancet device and testing strips. I can send Rx for test strips. I can not send Rx for lancet device.

## 2018-01-22 ENCOUNTER — Other Ambulatory Visit: Payer: Self-pay | Admitting: *Deleted

## 2018-01-22 MED ORDER — GLUCOSE BLOOD VI STRP: 1.0000 | ORAL_STRIP | Freq: Every day | 1 refills | Status: DC

## 2018-01-22 MED ORDER — ONETOUCH ULTRASOFT LANCETS MISC: 1.0000 | Freq: Every day | 1 refills | Status: DC

## 2018-01-22 MED ORDER — ONETOUCH ULTRA 2 W/DEVICE KIT: 1.0000 | PACK | Freq: Every day | 0 refills | Status: DC

## 2018-01-22 NOTE — Telephone Encounter (Signed)
Received fax stating patient's insurance plan on covers One Touch meter/supplies. New rxs sent for meter and supplies. See meds.

## 2018-01-25 ENCOUNTER — Ambulatory Visit: Payer: BC Managed Care – PPO | Admitting: Nurse Practitioner

## 2018-01-25 ENCOUNTER — Encounter: Payer: Self-pay | Admitting: Nurse Practitioner

## 2018-01-25 VITALS — BP 120/70 | HR 114 | Ht 63.0 in | Wt 164.0 lb

## 2018-01-25 DIAGNOSIS — M545 Low back pain, unspecified: Secondary | ICD-10-CM

## 2018-01-25 DIAGNOSIS — Z23 Encounter for immunization: Secondary | ICD-10-CM

## 2018-01-25 MED ORDER — MELOXICAM 7.5 MG PO TABS: 7.5000 mg | ORAL_TABLET | Freq: Every day | ORAL | 0 refills | Status: DC

## 2018-01-25 NOTE — Progress Notes (Signed)
Name: Julie Dawson   MRN: [id number]    DOB: 03/05/1961   Date:01/25/2018       Progress Note  Subjective  Chief Complaint  Chief Complaint  Patient presents with  . Back Pain    x 2 days    HPI Julie Dawson is here today for evaluation of acute complaints of lower back pain and stress. The back pain first began when she bent over to sweep the floor and felt a strain in her lower back, this occurred about 2 days ago, shes had constant pressure and pain in her lower back and left side since. She denies fevers, chills, abdominal pain, nausea, vomiting, urinary frequency, urgency, hematuria, numbness, tingling. She has tried epsom salt baths with no relief of her back pain. She is requesting a work note for 1 week due to back pain  Patient Active Problem List   Diagnosis Date Noted  . Cigarette nicotine dependence with nicotine-induced disorder 07/13/2017  . Type 2 diabetes mellitus with complication, without long-term current use of insulin ([place]) 08/23/2016  . 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 [place] SKIN 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 06/08/2010  . INSOMNIA 12/31/2009  . HERPES LABIALIS 01/21/2009  . ONYCHOMYCOSIS 07/29/2008  . HYPERCHOLESTEROLEMIA 07/29/2008  . DENTAL CARIES 04/21/2008  . OBESITY 03/13/2008  . MENOPAUSAL SYNDROME 03/13/2008  . HEADACHE 03/13/2008  . DRUG ABUSE, HX OF 03/13/2008    Social History   Tobacco Use  . Smoking status: Current Every Day Smoker    Packs/day: 1.00    Types: Cigarettes  . Smokeless tobacco: Never Used  . Tobacco comment: PT REQUESTING PATCH  Substance Use Topics  . Alcohol use: No     Current Outpatient Medications:  .  atorvastatin (LIPITOR) 20 MG tablet, Take 1 tablet (20 mg total) by mouth daily at 6 PM., Disp: 90 tablet, Rfl: 1 .  Blood Glucose Monitoring Suppl (ONE TOUCH ULTRA 2) w/Device KIT, 1 each by Other route daily. Dx: E11.8, Disp: 1 each, Rfl: 0 .  glucose blood (ONE TOUCH ULTRA TEST) test strip, 1 each by  Other route daily. To check blood sugar. Dx: E11.8, Disp: 100 each, Rfl: 1 .  JARDIANCE 10 MG TABS tablet, TAKE 1 TABLET BY MOUTH ONCE DAILY, Disp: 90 tablet, Rfl: 1 .  Lancets (ONETOUCH ULTRASOFT) lancets, 1 each by Other route daily. To check blood sugar. Dx: E11.8, Disp: 100 each, Rfl: 1 .  varenicline (CHANTIX CONTINUING MONTH PAK) 1 MG tablet, Take 1 tablet (1 mg total) by mouth 2 (two) times daily., Disp: 60 tablet, Rfl: 2 .  varenicline (CHANTIX) 0.5 MG tablet, Take 1 tablet (0.5 mg total) by mouth 2 (two) times daily. Take 1 tablet daily on days 1-3, then twice daily on days 4-7, Disp: 11 tablet, Rfl: 0  Allergies  Allergen Reactions  . Ace Inhibitors Swelling  . Lisinopril Swelling  . Metformin And Related Nausea Only    ROS  No other specific complaints in a complete review of systems (except as listed in HPI above).  Objective  Vitals:   01/25/18 1432  BP: 120/70  Pulse: (!) 114  SpO2: 95%  Weight: 164 lb (74.4 kg)  Height: 5' 3"  (1.6 m)    Body mass index is 29.05 kg/m.  Nursing Note and Vital Signs reviewed.  Physical Exam  Constitutional: Patient appears well-developed and well-nourished. No distress.  HEENT: head atraumatic, normocephalic, pupils equal and reactive to light, EOM's intact, neck supple, oropharynx pink and moist without  exudate Cardiovascular: Normal rate, regular rhythm, distal pulses intact. Pulmonary/Chest: Effort normal, No respiratory distress or retractions. Musculoskeletal: Normal range of motion, No gross deformities. Lumbar back is tender to touch without swelling, erythema or deformity Neurological: She is alert and oriented to person, place, and time. No cranial nerve deficit. Coordination, balance, strength, speech and gait are normal.  Skin: Skin is warm and dry. No rash noted. No erythema.  Psychiatric: Patient has a normal mood and affect. behavior is normal. Judgment and thought content normal.  Assessment & Plan RTC in 1-2  months for CPE  -Reviewed Health Maintenance:  Need for influenza vaccination- Flu Vaccine QUAD 36+ mos IM  Acute left-sided low back pain without sciatica We discussed that acute back pain is best treated with daily stretching/exercise and usually resolves with time Will treat with course of anti-inflammatory as well as daily back exercises-dosing and side effects discussed  Instructions given to schedule SM follow up if pain has not improved in 2 weeks She is requesting 1 week work excuse, this seems a bit lengthy but I will provide this 1 time only Home management, Red flags and when to present for emergency care or RTC including fever >101.63F, new/worsening/un-resolving symptoms,  reviewed with patient at time of visit and provided in AVS - meloxicam (MOBIC) 7.5 MG tablet; Take 1 tablet (7.5 mg total) by mouth daily.  Dispense: 14 tablet; Refill: 0   .

## 2018-01-25 NOTE — Patient Instructions (Addendum)
Please start mobic once daily for 2 weeks for pain and inflammation  Please start back exercises daily as we discussed.  Please schedule a follow up appointment for further evaluation here with Dr Katrinka Blazing or Dr Jordan Likes, our sports medicine providers, if your pain is no better in 2 weeks  Please return to see me in about 1-2 months for annual physical.   Back Exercises If you have pain in your back, do these exercises 2-3 times each day or as told by your doctor. When the pain goes away, do the exercises once each day, but repeat the steps more times for each exercise (do more repetitions). If you do not have pain in your back, do these exercises once each day or as told by your doctor. Exercises Single Knee to Chest  Do these steps 3-5 times in a row for each leg: 1. Lie on your back on a firm bed or the floor with your legs stretched out. 2. Bring one knee to your chest. 3. Hold your knee to your chest by grabbing your knee or thigh. 4. Pull on your knee until you feel a gentle stretch in your lower back. 5. Keep doing the stretch for 10-30 seconds. 6. Slowly let go of your leg and straighten it.  Pelvic Tilt  Do these steps 5-10 times in a row: 1. Lie on your back on a firm bed or the floor with your legs stretched out. 2. Bend your knees so they point up to the ceiling. Your feet should be flat on the floor. 3. Tighten your lower belly (abdomen) muscles to press your lower back against the floor. This will make your tailbone point up to the ceiling instead of pointing down to your feet or the floor. 4. Stay in this position for 5-10 seconds while you gently tighten your muscles and breathe evenly.  Cat-Cow  Do these steps until your lower back bends more easily: 1. Get on your hands and knees on a firm surface. Keep your hands under your shoulders, and keep your knees under your hips. You may put padding under your knees. 2. Let your head hang down, and make your tailbone point  down to the floor so your lower back is round like the back of a cat. 3. Stay in this position for 5 seconds. 4. Slowly lift your head and make your tailbone point up to the ceiling so your back hangs low (sags) like the back of a cow. 5. Stay in this position for 5 seconds.  Press-Ups  Do these steps 5-10 times in a row: 1. Lie on your belly (face-down) on the floor. 2. Place your hands near your head, about shoulder-width apart. 3. While you keep your back relaxed and keep your hips on the floor, slowly straighten your arms to raise the top half of your body and lift your shoulders. Do not use your back muscles. To make yourself more comfortable, you may change where you place your hands. 4. Stay in this position for 5 seconds. 5. Slowly return to lying flat on the floor.  Bridges  Do these steps 10 times in a row: 1. Lie on your back on a firm surface. 2. Bend your knees so they point up to the ceiling. Your feet should be flat on the floor. 3. Tighten your butt muscles and lift your butt off of the floor until your waist is almost as high as your knees. If you do not feel the muscles working in  your butt and the back of your thighs, slide your feet 1-2 inches farther away from your butt. 4. Stay in this position for 3-5 seconds. 5. Slowly lower your butt to the floor, and let your butt muscles relax.  If this exercise is too easy, try doing it with your arms crossed over your chest. Belly Crunches  Do these steps 5-10 times in a row: 1. Lie on your back on a firm bed or the floor with your legs stretched out. 2. Bend your knees so they point up to the ceiling. Your feet should be flat on the floor. 3. Cross your arms over your chest. 4. Tip your chin a little bit toward your chest but do not bend your neck. 5. Tighten your belly muscles and slowly raise your chest just enough to lift your shoulder blades a tiny bit off of the floor. 6. Slowly lower your chest and your head to the  floor.  Back Lifts Do these steps 5-10 times in a row: 1. Lie on your belly (face-down) with your arms at your sides, and rest your forehead on the floor. 2. Tighten the muscles in your legs and your butt. 3. Slowly lift your chest off of the floor while you keep your hips on the floor. Keep the back of your head in line with the curve in your back. Look at the floor while you do this. 4. Stay in this position for 3-5 seconds. 5. Slowly lower your chest and your face to the floor.  Contact a doctor if:  Your back pain gets a lot worse when you do an exercise.  Your back pain does not lessen 2 hours after you exercise. If you have any of these problems, stop doing the exercises. Do not do them again unless your doctor says it is okay. Get help right away if:  You have sudden, very bad back pain. If this happens, stop doing the exercises. Do not do them again unless your doctor says it is okay. This information is not intended to replace advice given to you by your health care provider. Make sure you discuss any questions you have with your health care provider. Document Released: 05/21/2010 Document Revised: 09/24/2015 Document Reviewed: 06/12/2014 Elsevier Interactive Patient Education  [employer].

## 2018-01-31 ENCOUNTER — Telehealth: Payer: Self-pay | Admitting: Nurse Practitioner

## 2018-01-31 ENCOUNTER — Encounter: Payer: Self-pay | Admitting: Nurse Practitioner

## 2018-01-31 NOTE — Telephone Encounter (Signed)
Copied from CRM [id number]. Topic: General - Other >> Jan 31, 2018 12:14 PM Arlyss Gandy, NT wrote: Reason for CRM: Pt would like to see if her out of work note extended to Monday, 02/05/18. Please advise pt.

## 2018-01-31 NOTE — Telephone Encounter (Signed)
Patient called and stated she needed an " asap appointment " with either Terrilee Files or Clare Gandy as a new patient for back pain. I advised her that Katrinka Blazing does not have anything this week for a new patient and that she could see Riki Rusk on 10/4 or we could check [place] for today or tomorrow ( patient hung up the phone ).
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
## 2018-02-02 ENCOUNTER — Encounter: Payer: Self-pay | Admitting: Family Medicine

## 2018-02-02 ENCOUNTER — Ambulatory Visit: Payer: BC Managed Care – PPO | Admitting: Family Medicine

## 2018-02-02 VITALS — BP 122/70 | HR 99 | Ht 63.0 in | Wt 166.2 lb

## 2018-02-02 DIAGNOSIS — G8929 Other chronic pain: Secondary | ICD-10-CM | POA: Diagnosis not present

## 2018-02-02 DIAGNOSIS — M545 Low back pain, unspecified: Secondary | ICD-10-CM

## 2018-02-02 MED ORDER — DICLOFENAC SODIUM 2 % TD SOLN: 1.0000 "application " | Freq: Two times a day (BID) | TRANSDERMAL | 3 refills | Status: DC

## 2018-02-02 MED ORDER — NAPROXEN 500 MG PO TABS: 500.0000 mg | ORAL_TABLET | Freq: Two times a day (BID) | ORAL | 3 refills | Status: DC

## 2018-02-02 NOTE — Patient Instructions (Signed)
Nice to meet you  Please try the exercises  Please try the medicine  I have sent in a rub on medicine as well.  Please try heat over the areas that are painful. Please try working on your posture while you are driving or sitting in class. Please follow-up with me in 3 to 4 weeks if your pain has not improved.

## 2018-02-04 DIAGNOSIS — M545 Low back pain, unspecified: Secondary | ICD-10-CM | POA: Insufficient documentation

## 2018-02-04 DIAGNOSIS — G8929 Other chronic pain: Secondary | ICD-10-CM | POA: Insufficient documentation

## 2018-02-04 NOTE — Assessment & Plan Note (Signed)
Pain is acute on chronic in nature.  Does have mild degenerative changes seen on x-ray from 2 years ago.  Could be associated with facet degeneration.  Does have a history of diabetes -Naproxen and Pennsaid provided. -Counseled on posture. -Counseled on home exercise therapy. -Counseled on supportive care. -If no improvement can consider physical therapy.

## 2018-02-12 ENCOUNTER — Encounter: Payer: Self-pay | Admitting: Nurse Practitioner

## 2018-02-19 ENCOUNTER — Encounter: Payer: Self-pay | Admitting: Family

## 2018-02-19 ENCOUNTER — Ambulatory Visit: Payer: BC Managed Care – PPO | Admitting: Family

## 2018-02-19 ENCOUNTER — Other Ambulatory Visit (INDEPENDENT_AMBULATORY_CARE_PROVIDER_SITE_OTHER): Payer: BC Managed Care – PPO

## 2018-02-19 VITALS — BP 116/78 | HR 97 | Temp 98.3°F | Ht 63.0 in | Wt 167.0 lb

## 2018-02-19 DIAGNOSIS — E118 Type 2 diabetes mellitus with unspecified complications: Secondary | ICD-10-CM

## 2018-02-19 DIAGNOSIS — Z720 Tobacco use: Secondary | ICD-10-CM

## 2018-02-19 DIAGNOSIS — E7800 Pure hypercholesterolemia, unspecified: Secondary | ICD-10-CM

## 2018-02-19 LAB — COMPREHENSIVE METABOLIC PANEL
ALK PHOS: 98 U/L (ref 39–117)
ALT: 12 U/L (ref 0–35)
AST: 12 U/L (ref 0–37)
Albumin: 4.5 g/dL (ref 3.5–5.2)
BILIRUBIN TOTAL: 0.4 mg/dL (ref 0.2–1.2)
BUN: 21 mg/dL (ref 6–23)
CALCIUM: 9.2 mg/dL (ref 8.4–10.5)
CO2: 24 mEq/L (ref 19–32)
Chloride: 109 mEq/L (ref 96–112)
Creatinine, Ser: 1.45 mg/dL — ABNORMAL HIGH (ref 0.40–1.20)
GFR: 47.86 mL/min — ABNORMAL LOW (ref >60.00)
GLUCOSE: 102 mg/dL — AB (ref 70–99)
POTASSIUM: 3.8 meq/L (ref 3.5–5.1)
Sodium: 139 mEq/L (ref 135–145)
TOTAL PROTEIN: 7.4 g/dL (ref 6.0–8.3)

## 2018-02-19 LAB — LIPID PANEL
Cholesterol: 127 mg/dL (ref 0–200)
HDL: 35.3 mg/dL — AB (ref >39.00)
LDL CALC: 65 mg/dL (ref 0–99)
NonHDL: 91.39
Total CHOL/HDL Ratio: 4
Triglycerides: 134 mg/dL (ref 0.0–149.0)
VLDL: 26.8 mg/dL (ref 0.0–40.0)

## 2018-02-19 LAB — HEMOGLOBIN A1C: Hgb A1c MFr Bld: 7.3 % — ABNORMAL HIGH (ref 4.6–6.5)

## 2018-02-19 MED ORDER — ATORVASTATIN CALCIUM 20 MG PO TABS: 20.0000 mg | ORAL_TABLET | Freq: Every day | ORAL | 1 refills | Status: DC

## 2018-02-19 MED ORDER — VARENICLINE TARTRATE 0.5 MG X 11 & 1 MG X 42 PO MISC: ORAL | 0 refills | Status: DC

## 2018-02-19 MED ORDER — EMPAGLIFLOZIN 10 MG PO TABS: 10.0000 mg | ORAL_TABLET | Freq: Every day | ORAL | 1 refills | Status: DC

## 2018-02-19 NOTE — Progress Notes (Signed)
Julie Dawson is a 57 y.o. female with the following history as recorded in [employer]:  Patient Active Problem List   Diagnosis Date Noted  . Chronic bilateral low back pain without sciatica 02/04/2018  . Cigarette nicotine dependence with nicotine-induced disorder 07/13/2017  . Type 2 diabetes mellitus with complication, without long-term current use of insulin ([place]) 08/23/2016  . 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 [place] SKIN 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 06/08/2010  . INSOMNIA 12/31/2009  . HERPES LABIALIS 01/21/2009  . ONYCHOMYCOSIS 07/29/2008  . HYPERCHOLESTEROLEMIA 07/29/2008  . DENTAL CARIES 04/21/2008  . OBESITY 03/13/2008  . MENOPAUSAL SYNDROME 03/13/2008  . HEADACHE 03/13/2008  . DRUG ABUSE, HX OF 03/13/2008    Current Outpatient Medications  Medication Sig Dispense Refill  . atorvastatin (LIPITOR) 20 MG tablet Take 1 tablet (20 mg total) by mouth daily at 6 PM. 90 tablet 1  . Blood Glucose Monitoring Suppl (ONE TOUCH ULTRA 2) w/Device KIT 1 each by Other route daily. Dx: E11.8 1 each 0  . Diclofenac Sodium (PENNSAID) 2 % SOLN Place 1 application onto the skin 2 (two) times daily. 1 Bottle 3  . empagliflozin (JARDIANCE) 10 MG TABS tablet Take 10 mg by mouth daily. 90 tablet 1  . glucose blood (ONE TOUCH ULTRA TEST) test strip 1 each by Other route daily. To check blood sugar. Dx: E11.8 100 each 1  . Lancets (ONETOUCH ULTRASOFT) lancets 1 each by Other route daily. To check blood sugar. Dx: E11.8 100 each 1  . meloxicam (MOBIC) 7.5 MG tablet Take 1 tablet (7.5 mg total) by mouth daily. 14 tablet 0  . naproxen (NAPROSYN) 500 MG tablet Take 1 tablet (500 mg total) by mouth 2 (two) times daily with a meal. 60 tablet 3  . varenicline (CHANTIX STARTING MONTH PAK) 0.5 MG X 11 & 1 MG X 42 tablet Take one 0.5 mg tablet po qd x for 3 days, then increase to one 0.5 mg tablet po bid  for 4 days, then 1 po bid 53 tablet 0   No current facility-administered medications for this  visit.     Allergies: Ace inhibitors; Lisinopril; and Metformin and related  Past Medical History:  Diagnosis Date  . Allergy    spring time only   . Constipation    doesnt use otc medicines  . Diabetes mellitus without complication ([place])   . History of positive PPD    cannot do the tine test,  has to do CXR  . Hyperlipidemia     Past Surgical History:  Procedure Laterality Date  . cervical adenitis node remova    . ECTOPIC PREGNANCY SURGERY    . MOUTH SURGERY     for wisdom teeth removal    Family History  Problem Relation Age of Onset  . Diabetes Mother   . Hypertension Mother   . COPD Mother   . Stroke Father   . Diabetes Father   . Hypertension Father   . Diabetes Sister   . Drug abuse Brother   . Alcohol abuse Sister   . Colon cancer Neg Hx   . Colon polyps Neg Hx   . Esophageal cancer Neg Hx   . Rectal cancer Neg Hx   . Stomach cancer Neg Hx     Social History   Tobacco Use  . Smoking status: Current Every Day Smoker    Packs/day: 1.00    Types: Cigarettes  . Smokeless tobacco: Never Used  . Tobacco comment: PT REQUESTING  PATCH  Substance Use Topics  . Alcohol use: No    Subjective:  Patient presents to follow up on her Type 2 Diabetes; requesting to get her labs updated; in the process of changing jobs and needs her labs updated/ refills updated; has noticed some recent elevations in her blood sugar- admits that has not been drinking enough water; Notes that blood sugars have actually started to improve since drinking more water; Denies any chest pain, shortness of breath, blurred vision or headache.   Objective:  Vitals:   02/19/18 1445  BP: 116/78  Pulse: 97  Temp: 98.3 F (36.8 C)  TempSrc: Oral  SpO2: 96%  Weight: 167 lb (75.8 kg)  Height: 5' 3"  (1.6 m)    General: Well developed, well nourished, in no acute distress  Skin : Warm and dry.  Head: Normocephalic and atraumatic  Lungs: Respirations unlabored; clear to auscultation bilaterally  without wheeze, rales, rhonchi  CVS exam: normal rate and regular rhythm.  Abdomen: Soft; nontender; nondistended; normoactive bowel sounds; no masses or hepatosplenomegaly  Neurologic: Alert and oriented; speech intact; face symmetrical; moves all extremities well; CNII-XII intact without focal deficit   Assessment:  1. Type 2 diabetes mellitus with complication, without long-term current use of insulin ([place])   2. HYPERCHOLESTEROLEMIA   3. Tobacco abuse     Plan:  1. Patient does not want to add another medication at this time; notes that her fasting blood sugars were slightly elevated in the past few weeks but she has been making dietary changes and seeing improvement;  Update labs and refills today; 2. Check lipid panel per patient request; 3. Stressed need to quit smoking; trial of Chantix given again;   No follow-ups on file.  Orders Placed This Encounter  Procedures  . Comp Met (CMET)    Standing Status:   Future    Number of Occurrences:   1    Standing Expiration Date:   02/19/2019  . HgB A1c    Standing Status:   Future    Number of Occurrences:   1    Standing Expiration Date:   02/19/2019  . Lipid panel    Standing Status:   Future    Number of Occurrences:   1    Standing Expiration Date:   02/20/2019    Requested Prescriptions   Signed Prescriptions Disp Refills  . atorvastatin (LIPITOR) 20 MG tablet 90 tablet 1    Sig: Take 1 tablet (20 mg total) by mouth daily at 6 PM.  . empagliflozin (JARDIANCE) 10 MG TABS tablet 90 tablet 1    Sig: Take 10 mg by mouth daily.  . varenicline (CHANTIX STARTING MONTH PAK) 0.5 MG X 11 & 1 MG X 42 tablet 53 tablet 0    Sig: Take one 0.5 mg tablet po qd x for 3 days, then increase to one 0.5 mg tablet po bid  for 4 days, then 1 po bid

## 2018-03-07 ENCOUNTER — Ambulatory Visit: Payer: BC Managed Care – PPO | Admitting: Nurse Practitioner

## 2018-04-26 ENCOUNTER — Encounter: Payer: Self-pay | Admitting: Nurse Practitioner

## 2018-05-06 ENCOUNTER — Emergency Department (HOSPITAL_COMMUNITY)
Admission: EM | Admit: 2018-05-06 | Discharge: 2018-05-06 | Disposition: A | Discharge status: Home / Self Care | Payer: BC Managed Care – PPO | Attending: Emergency Medicine | Admitting: Emergency Medicine

## 2018-05-06 ENCOUNTER — Other Ambulatory Visit: Payer: Self-pay

## 2018-05-06 ENCOUNTER — Encounter (HOSPITAL_COMMUNITY): Payer: Self-pay | Admitting: Emergency Medicine

## 2018-05-06 DIAGNOSIS — E119 Type 2 diabetes mellitus without complications: Secondary | ICD-10-CM | POA: Insufficient documentation

## 2018-05-06 DIAGNOSIS — Z79899 Other long term (current) drug therapy: Secondary | ICD-10-CM | POA: Insufficient documentation

## 2018-05-06 DIAGNOSIS — F17210 Nicotine dependence, cigarettes, uncomplicated: Secondary | ICD-10-CM | POA: Insufficient documentation

## 2018-05-06 DIAGNOSIS — Z7984 Long term (current) use of oral hypoglycemic drugs: Secondary | ICD-10-CM | POA: Insufficient documentation

## 2018-05-06 DIAGNOSIS — N898 Other specified noninflammatory disorders of vagina: Secondary | ICD-10-CM | POA: Insufficient documentation

## 2018-05-06 LAB — URINALYSIS, ROUTINE W REFLEX MICROSCOPIC
Bacteria, UA: NONE SEEN
Bilirubin Urine: NEGATIVE
Hgb urine dipstick: NEGATIVE
Ketones, ur: NEGATIVE mg/dL
Leukocytes, UA: NEGATIVE
Nitrite: NEGATIVE
PH: 5 (ref 5.0–8.0)
PROTEIN: NEGATIVE mg/dL
SPECIFIC GRAVITY, URINE: 1.014 (ref 1.005–1.030)

## 2018-05-06 LAB — WET PREP, GENITAL
CLUE CELLS WET PREP: NONE SEEN
SPERM: NONE SEEN
Trich, Wet Prep: NONE SEEN
Yeast Wet Prep HPF POC: NONE SEEN

## 2018-05-06 MED ORDER — FLUCONAZOLE 150 MG PO TABS: 150.0000 mg | ORAL_TABLET | Freq: Every day | ORAL | 0 refills | Status: DC

## 2018-05-06 MED ORDER — HYDROCORTISONE 1 % EX CREA: TOPICAL_CREAM | CUTANEOUS | 0 refills | Status: DC

## 2018-05-06 NOTE — ED Notes (Signed)
Patient verbalized understanding of discharge instructions and denies any further needs or questions at this time. VS stable. Patient ambulatory with steady gait.  

## 2018-05-06 NOTE — ED Triage Notes (Signed)
Pt. Stated, Julie Dawson had a vaginal discharge with itching for about 2 months.

## 2018-05-06 NOTE — ED Provider Notes (Signed)
[facility] EMERGENCY DEPARTMENT Provider Note   CSN: [id number] Arrival date & time: 05/06/18  6599     History   Chief Complaint Chief Complaint  Patient presents with  . Vaginal Discharge    HPI Julie Dawson is a 58 y.o. female.  The history is provided by the patient. No language interpreter was used.  Vaginal Discharge   This is a new problem. Episode onset: 2 months. The problem occurs constantly. The problem has been gradually worsening. The discharge occurs after urination. The discharge was white. She is not pregnant. She has not missed her period. Associated symptoms include genital burning and genital itching. She has tried nothing for the symptoms. The treatment provided no relief.  LMP over 10 years ago.  Pt reports vaginal itching.  Pt reports areas feel dry. Pt is diabetic.  Pt reports glucose has been around 130.   Past Medical History:  Diagnosis Date  . Allergy    spring time only   . Constipation    doesnt use otc medicines  . Diabetes mellitus without complication ([place])   . History of positive PPD    cannot do the tine test,  has to do CXR  . Hyperlipidemia     Patient Active Problem List   Diagnosis Date Noted  . Chronic bilateral low back pain without sciatica 02/04/2018  . Cigarette nicotine dependence with nicotine-induced disorder 07/13/2017  . Type 2 diabetes mellitus with complication, without long-term current use of insulin ([place]) 08/23/2016  . 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 [place] SKIN 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 06/08/2010  . INSOMNIA 12/31/2009  . HERPES LABIALIS 01/21/2009  . ONYCHOMYCOSIS 07/29/2008  . HYPERCHOLESTEROLEMIA 07/29/2008  . DENTAL CARIES 04/21/2008  . OBESITY 03/13/2008  . MENOPAUSAL SYNDROME 03/13/2008  . HEADACHE 03/13/2008  . DRUG ABUSE, HX OF 03/13/2008    Past Surgical History:  Procedure Laterality Date  . cervical adenitis node remova    . ECTOPIC PREGNANCY SURGERY    . MOUTH  SURGERY     for wisdom teeth removal     OB History   No obstetric history on file.      Home Medications    Prior to Admission medications   Medication Sig Start Date End Date Taking? Authorizing Provider  atorvastatin (LIPITOR) 20 MG tablet Take 1 tablet (20 mg total) by mouth daily at 6 PM. 02/19/18   Marrian Salvage, FNP  Blood Glucose Monitoring Suppl (ONE TOUCH ULTRA 2) w/Device KIT 1 each by Other route daily. Dx: E11.8 01/22/18   Lance Sell, NP  Diclofenac Sodium (PENNSAID) 2 % SOLN Place 1 application onto the skin 2 (two) times daily. 02/02/18   Rosemarie Ax, MD  empagliflozin (JARDIANCE) 10 MG TABS tablet Take 10 mg by mouth daily. 02/19/18   Marrian Salvage, FNP  glucose blood (ONE TOUCH ULTRA TEST) test strip 1 each by Other route daily. To check blood sugar. Dx: E11.8 01/22/18   Lance Sell, NP  Lancets (ONETOUCH ULTRASOFT) lancets 1 each by Other route daily. To check blood sugar. Dx: E11.8 01/22/18   Lance Sell, NP  meloxicam (MOBIC) 7.5 MG tablet Take 1 tablet (7.5 mg total) by mouth daily. 01/25/18   Lance Sell, NP  naproxen (NAPROSYN) 500 MG tablet Take 1 tablet (500 mg total) by mouth 2 (two) times daily with a meal. 02/02/18 02/02/19  Rosemarie Ax, MD  varenicline (CHANTIX STARTING MONTH PAK) 0.5 MG X  11 & 1 MG X 42 tablet Take one 0.5 mg tablet po qd x for 3 days, then increase to one 0.5 mg tablet po bid  for 4 days, then 1 po bid 02/19/18   Marrian Salvage, FNP    Family History Family History  Problem Relation Age of Onset  . Diabetes Mother   . Hypertension Mother   . COPD Mother   . Stroke Father   . Diabetes Father   . Hypertension Father   . Diabetes Sister   . Drug abuse Brother   . Alcohol abuse Sister   . Colon cancer Neg Hx   . Colon polyps Neg Hx   . Esophageal cancer Neg Hx   . Rectal cancer Neg Hx   . Stomach cancer Neg Hx     Social History Social History   Tobacco Use    . Smoking status: Current Every Day Smoker    Packs/day: 1.00    Types: Cigarettes  . Smokeless tobacco: Never Used  . Tobacco comment: PT REQUESTING PATCH  Substance Use Topics  . Alcohol use: No  . 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     Allergies   Ace inhibitors; Lisinopril; and Metformin and related   Review of Systems Review of Systems  Genitourinary: Positive for vaginal discharge and vaginal pain.  All other systems reviewed and are negative.    Physical Exam Updated Vital Signs BP (!) 150/92 (BP Location: Right Arm)   Pulse (!) 102   Temp 98.4 F (36.9 C) (Oral)   Resp 18   Ht 5' 4"  (1.626 m)   Wt 74.4 kg   SpO2 99%   BMI 28.15 kg/m   Physical Exam Constitutional:      Appearance: She is well-developed.  HENT:     Head: Normocephalic and atraumatic.     Nose: Nose normal.  Eyes:     Conjunctiva/sclera: Conjunctivae normal.     Pupils: Pupils are equal, round, and reactive to light.  Neck:     Musculoskeletal: Normal range of motion and neck supple.  Cardiovascular:     Rate and Rhythm: Normal rate.     Pulses: Normal pulses.  Pulmonary:     Effort: Pulmonary effort is normal.  Abdominal:     Palpations: Abdomen is soft.     Tenderness: There is no abdominal tenderness.  Genitourinary:    Vagina: Vaginal discharge present.     Comments: Vaginal discharge,  Thin white,  External labia slightly discolored, no obvious lesions  Musculoskeletal: Normal range of motion.  Skin:    General: Skin is warm.  Neurological:     General: No focal deficit present.     Mental Status: She is alert.  Psychiatric:        Mood and Affect: Mood normal.      ED Treatments / Results  Labs (all labs ordered are listed, but only abnormal results are displayed) Labs Reviewed  WET PREP, GENITAL - Abnormal; Notable for the following components:      Result Value   WBC, Wet Prep HPF POC MANY (*)    All other components within normal  limits  URINALYSIS, ROUTINE W REFLEX MICROSCOPIC - Abnormal; Notable for the following components:   Color, Urine STRAW (*)    Glucose, UA >=500 (*)    All other components within normal limits  GC/CHLAMYDIA PROBE AMP ([place]) NOT AT [facility]    EKG None  Radiology  No results found.  Procedures Procedures (including critical care time)  Medications Ordered in ED Medications - No data to display   Initial Impression / Assessment and Plan / ED Course  I have reviewed the triage vital signs and the nursing notes.  Pertinent labs & imaging results that were available during my care of the patient were reviewed by me and considered in my medical decision making (see chart for details).     MDM  Wet prep is negative,  I will treat with diflucan as I am still concerned pt may have yeast. (Pt is diabetic) Symptoms could also be atrophic vaginitis.  No obvious lesions to indicate lichens/vulva cancer.  Pt advised to try thin layer of hydrocortisone ointment.  Pt advised to call tomorrow to schedule to be seen at [facility] for further evaluation   Final Clinical Impressions(s) / ED Diagnoses   Final diagnoses:  Vaginal irritation    ED Discharge Orders         Ordered    fluconazole (DIFLUCAN) 150 MG tablet  Daily     05/06/18 1107    hydrocortisone cream 1 %     05/06/18 1107        An After Visit Summary was printed and given to the patient.    Fransico Meadow, PA-C 05/06/18 1108    Julianne Rice, MD 05/07/18 [phone]

## 2018-05-07 LAB — GC/CHLAMYDIA PROBE AMP (~~LOC~~) NOT AT ARMC
Chlamydia: NEGATIVE
Neisseria Gonorrhea: NEGATIVE

## 2018-05-08 ENCOUNTER — Encounter: Payer: Self-pay | Admitting: Nurse Practitioner

## 2018-08-09 ENCOUNTER — Encounter: Payer: Self-pay | Admitting: Family

## 2018-08-09 NOTE — Telephone Encounter (Signed)
If we have samples of Jardiance, she can have them. Otherwise, she should go online and look at patient assistance options through [facility]- they make Jardiance. I will reach out to one of the reps next week but not sure how that is working these days.

## 2018-09-23 ENCOUNTER — Encounter: Payer: Self-pay | Admitting: Family

## 2018-09-23 ENCOUNTER — Emergency Department (HOSPITAL_COMMUNITY)
Admission: EM | Admit: 2018-09-23 | Discharge: 2018-09-23 | Disposition: A | Discharge status: Home / Self Care | Payer: Self-pay | Attending: Emergency Medicine | Admitting: Emergency Medicine

## 2018-09-23 ENCOUNTER — Other Ambulatory Visit: Payer: Self-pay

## 2018-09-23 ENCOUNTER — Encounter (HOSPITAL_COMMUNITY): Payer: Self-pay | Admitting: Emergency Medicine

## 2018-09-23 DIAGNOSIS — R21 Rash and other nonspecific skin eruption: Secondary | ICD-10-CM | POA: Insufficient documentation

## 2018-09-23 DIAGNOSIS — Z7984 Long term (current) use of oral hypoglycemic drugs: Secondary | ICD-10-CM | POA: Insufficient documentation

## 2018-09-23 DIAGNOSIS — F17210 Nicotine dependence, cigarettes, uncomplicated: Secondary | ICD-10-CM | POA: Insufficient documentation

## 2018-09-23 DIAGNOSIS — B029 Zoster without complications: Secondary | ICD-10-CM | POA: Insufficient documentation

## 2018-09-23 DIAGNOSIS — Z79899 Other long term (current) drug therapy: Secondary | ICD-10-CM | POA: Insufficient documentation

## 2018-09-23 DIAGNOSIS — E119 Type 2 diabetes mellitus without complications: Secondary | ICD-10-CM | POA: Insufficient documentation

## 2018-09-23 MED ORDER — ACYCLOVIR 400 MG PO TABS: 400.0000 mg | ORAL_TABLET | Freq: Four times a day (QID) | ORAL | 0 refills | Status: DC

## 2018-09-23 MED ORDER — IBUPROFEN 600 MG PO TABS: 600.0000 mg | ORAL_TABLET | Freq: Four times a day (QID) | ORAL | 0 refills | Status: DC | PRN

## 2018-09-23 NOTE — Discharge Instructions (Signed)
Your rash is likely due to shingles which is a herpes infection.  Take acyclovir as prescribed.  Return to the ED if your condition worsen or if you have any other concern.

## 2018-09-23 NOTE — ED Triage Notes (Signed)
Pt from home with right arm pain starting 2 days ago. Pain  8 on scale 0-10. Hurts more when putting arm down. Pain decreases with arm elevation. 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   Pt. has developed rash center chest this morning and some developing on upper lip, pt. States it itches.

## 2018-09-23 NOTE — ED Provider Notes (Signed)
[facility] EMERGENCY DEPARTMENT Provider Note   CSN: [id number] Arrival date & time: 09/23/18  0557    History   Chief Complaint No chief complaint on file.   HPI Julie Dawson is a 58 y.o. female.     The history is provided by the patient. No language interpreter was used.     58 year old female with history of non-insulin-dependent diabetes, tobacco use, obesity, presenting complaining of pain in her chest and a rash.  Patient report for the past 2 days she has had intense sharp burning pain to the right side of her chest that wraps around to her back.  Pain also described as a pressure sensation and this morning she noticed a rash to her anterior chest.  Rash is painful and mildly itchy.  She does not complain of any significant fever, shortness of breath, productive cough side from her usual occasional cough due to smoking.  She denies any nausea vomiting diarrhea abdominal pain.  She denies any recent trauma.  No prior history of shingles.  She also noticed a fever blister to her lip that started this morning. No specific treatment tried.   Past Medical History:  Diagnosis Date  . Allergy    spring time only   . Constipation    doesnt use otc medicines  . Diabetes mellitus without complication ([place])   . History of positive PPD    cannot do the tine test,  has to do CXR  . Hyperlipidemia     Patient Active Problem List   Diagnosis Date Noted  . Chronic bilateral low back pain without sciatica 02/04/2018  . Cigarette nicotine dependence with nicotine-induced disorder 07/13/2017  . Type 2 diabetes mellitus with complication, without long-term current use of insulin ([place]) 08/23/2016  . 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 [place] SKIN 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 06/08/2010  . INSOMNIA 12/31/2009  . HERPES LABIALIS 01/21/2009  . ONYCHOMYCOSIS 07/29/2008  . HYPERCHOLESTEROLEMIA 07/29/2008  . DENTAL CARIES 04/21/2008  . OBESITY 03/13/2008  .  MENOPAUSAL SYNDROME 03/13/2008  . HEADACHE 03/13/2008  . DRUG ABUSE, HX OF 03/13/2008    Past Surgical History:  Procedure Laterality Date  . cervical adenitis node remova    . ECTOPIC PREGNANCY SURGERY    . MOUTH SURGERY     for wisdom teeth removal     OB History   No obstetric history on file.      Home Medications    Prior to Admission medications   Medication Sig Start Date End Date Taking? Authorizing Provider  atorvastatin (LIPITOR) 20 MG tablet Take 1 tablet (20 mg total) by mouth daily at 6 PM. 02/19/18   Marrian Salvage, FNP  Blood Glucose Monitoring Suppl (ONE TOUCH ULTRA 2) w/Device KIT 1 each by Other route daily. Dx: E11.8 01/22/18   Lance Sell, NP  Diclofenac Sodium (PENNSAID) 2 % SOLN Place 1 application onto the skin 2 (two) times daily. 02/02/18   Rosemarie Ax, MD  empagliflozin (JARDIANCE) 10 MG TABS tablet Take 10 mg by mouth daily. 02/19/18   Marrian Salvage, FNP  fluconazole (DIFLUCAN) 150 MG tablet Take 1 tablet (150 mg total) by mouth daily. 05/06/18   Fransico Meadow, PA-C  glucose blood (ONE TOUCH ULTRA TEST) test strip 1 each by Other route daily. To check blood sugar. Dx: E11.8 01/22/18   Lance Sell, NP  hydrocortisone cream 1 % Apply a thin layer to area of irritation twice a day  05/06/18   Fransico Meadow, PA-C  Lancets [facility] ULTRASOFT) lancets 1 each by Other route daily. To check blood sugar. Dx: E11.8 01/22/18   Lance Sell, NP  meloxicam (MOBIC) 7.5 MG tablet Take 1 tablet (7.5 mg total) by mouth daily. 01/25/18   Lance Sell, NP  naproxen (NAPROSYN) 500 MG tablet Take 1 tablet (500 mg total) by mouth 2 (two) times daily with a meal. 02/02/18 02/02/19  Rosemarie Ax, MD  varenicline (CHANTIX STARTING MONTH PAK) 0.5 MG X 11 & 1 MG X 42 tablet Take one 0.5 mg tablet po qd x for 3 days, then increase to one 0.5 mg tablet po bid  for 4 days, then 1 po bid 02/19/18   Marrian Salvage, FNP     Family History Family History  Problem Relation Age of Onset  . Diabetes Mother   . Hypertension Mother   . COPD Mother   . Stroke Father   . Diabetes Father   . Hypertension Father   . Diabetes Sister   . Drug abuse Brother   . Alcohol abuse Sister   . Colon cancer Neg Hx   . Colon polyps Neg Hx   . Esophageal cancer Neg Hx   . Rectal cancer Neg Hx   . Stomach cancer Neg Hx     Social History Social History   Tobacco Use  . Smoking status: Current Every Day Smoker    Packs/day: 1.00    Types: Cigarettes  . Smokeless tobacco: Never Used  . Tobacco comment: PT REQUESTING PATCH  Substance Use Topics  . Alcohol use: No  . 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     Allergies   Ace inhibitors; Lisinopril; and Metformin and related   Review of Systems Review of Systems  All other systems reviewed and are negative.    Physical Exam Updated Vital Signs BP (!) 136/123   Pulse 90   Resp (!) 26   Ht _0  (1.6 m)   Wt 74.4 kg   SpO2 99%   BMI 29.05 kg/m   Physical Exam Vitals signs and nursing note reviewed.  Constitutional:      General: She is not in acute distress.    Appearance: She is well-developed.  HENT:     Head: Atraumatic.  Eyes:     Conjunctiva/sclera: Conjunctivae normal.  Neck:     Musculoskeletal: Neck supple.  Cardiovascular:     Rate and Rhythm: Normal rate and regular rhythm.     Pulses: Normal pulses.     Heart sounds: Normal heart sounds.  Pulmonary:     Effort: Pulmonary effort is normal.     Breath sounds: Normal breath sounds.  Abdominal:     Palpations: Abdomen is soft.     Tenderness: There is no abdominal tenderness.  Skin:    Findings: No rash (Vesicular rash noted to right anterior lateral chest consistent with shingles.  Rash follows dermatomal distribution.).  Neurological:     Mental Status: She is alert.      ED Treatments / Results  Labs (all labs ordered are listed, but only abnormal  results are displayed) Labs Reviewed - No data to display  EKG None  Radiology No results found.  Procedures Procedures (including critical care time)  Medications Ordered in ED Medications - No data to display   Initial Impression / Assessment and Plan / ED Course  I have reviewed the triage vital  signs and the nursing notes.  Pertinent labs & imaging results that were available during my care of the patient were reviewed by me and considered in my medical decision making (see chart for details).        BP (!) 136/123   Pulse 90   Resp (!) 26   Ht _0  (1.6 m)   Wt 74.4 kg   SpO2 99%   BMI 29.05 kg/m    Final Clinical Impressions(s) / ED Diagnoses   Final diagnoses:  Herpes zoster without complication    ED Discharge Orders         Ordered    acyclovir (ZOVIRAX) 400 MG tablet  4 times daily     09/23/18 0749    ibuprofen (ADVIL) 600 MG tablet  Every 6 hours PRN     09/23/18 0749         7:41 AM Patient here with pain in her chest and rash consistent with shingles.  She will benefit from acyclovir as treatment.  Ibuprofen for pain.  Return precaution discussed.  Symptom is not consistent with ACS, PE, pneumonia or other acute life-threatening emergency.    Domenic Moras, PA-C 09/23/18 0750    Margette Fast, MD 09/24/18 [phone]

## 2018-09-25 NOTE — Telephone Encounter (Signed)
Please schedule virtual visit. Does not need to come to office for this.

## 2018-09-26 ENCOUNTER — Encounter: Payer: Self-pay | Admitting: Family

## 2018-09-26 ENCOUNTER — Other Ambulatory Visit: Payer: Self-pay

## 2018-09-26 ENCOUNTER — Ambulatory Visit (INDEPENDENT_AMBULATORY_CARE_PROVIDER_SITE_OTHER): Payer: Self-pay | Admitting: Family

## 2018-09-26 VITALS — Wt 163.0 lb

## 2018-09-26 DIAGNOSIS — B029 Zoster without complications: Secondary | ICD-10-CM

## 2018-09-26 DIAGNOSIS — E118 Type 2 diabetes mellitus with unspecified complications: Secondary | ICD-10-CM

## 2018-09-26 NOTE — Progress Notes (Signed)
Julie Dawson is a 58 y.o. female with the following history as recorded in [employer]:  Patient Active Problem List   Diagnosis Date Noted  . Chronic bilateral low back pain without sciatica 02/04/2018  . Cigarette nicotine dependence with nicotine-induced disorder 07/13/2017  . Type 2 diabetes mellitus with complication, without long-term current use of insulin ([place]) 08/23/2016  . 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 [place] SKIN 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 06/08/2010  . INSOMNIA 12/31/2009  . HERPES LABIALIS 01/21/2009  . ONYCHOMYCOSIS 07/29/2008  . HYPERCHOLESTEROLEMIA 07/29/2008  . DENTAL CARIES 04/21/2008  . OBESITY 03/13/2008  . MENOPAUSAL SYNDROME 03/13/2008  . HEADACHE 03/13/2008  . DRUG ABUSE, HX OF 03/13/2008    Current Outpatient Medications  Medication Sig Dispense Refill  . acyclovir (ZOVIRAX) 400 MG tablet Take 1 tablet (400 mg total) by mouth 4 (four) times daily. 50 tablet 0  . atorvastatin (LIPITOR) 20 MG tablet Take 1 tablet (20 mg total) by mouth daily at 6 PM. 90 tablet 1  . Blood Glucose Monitoring Suppl (ONE TOUCH ULTRA 2) w/Device KIT 1 each by Other route daily. Dx: E11.8 1 each 0  . Diclofenac Sodium (PENNSAID) 2 % SOLN Place 1 application onto the skin 2 (two) times daily. 1 Bottle 3  . empagliflozin (JARDIANCE) 10 MG TABS tablet Take 10 mg by mouth daily. 90 tablet 1  . fluconazole (DIFLUCAN) 150 MG tablet Take 1 tablet (150 mg total) by mouth daily. 1 tablet 0  . glucose blood (ONE TOUCH ULTRA TEST) test strip 1 each by Other route daily. To check blood sugar. Dx: E11.8 100 each 1  . hydrocortisone cream 1 % Apply a thin layer to area of irritation twice a day 15 g 0  . ibuprofen (ADVIL) 600 MG tablet Take 1 tablet (600 mg total) by mouth every 6 (six) hours as needed. 30 tablet 0  . Lancets (ONETOUCH ULTRASOFT) lancets 1 each by Other route daily. To check blood sugar. Dx: E11.8 100 each 1  . meloxicam (MOBIC) 7.5 MG tablet Take 1 tablet (7.5 mg  total) by mouth daily. 14 tablet 0  . naproxen (NAPROSYN) 500 MG tablet Take 1 tablet (500 mg total) by mouth 2 (two) times daily with a meal. 60 tablet 3  . varenicline (CHANTIX STARTING MONTH PAK) 0.5 MG X 11 & 1 MG X 42 tablet Take one 0.5 mg tablet po qd x for 3 days, then increase to one 0.5 mg tablet po bid  for 4 days, then 1 po bid 53 tablet 0   No current facility-administered medications for this visit.     Allergies: Ace inhibitors; Lisinopril; and Metformin and related  Past Medical History:  Diagnosis Date  . Allergy    spring time only   . Constipation    doesnt use otc medicines  . Diabetes mellitus without complication ([place])   . History of positive PPD    cannot do the tine test,  has to do CXR  . Hyperlipidemia     Past Surgical History:  Procedure Laterality Date  . cervical adenitis node remova    . ECTOPIC PREGNANCY SURGERY    . MOUTH SURGERY     for wisdom teeth removal    Family History  Problem Relation Age of Onset  . Diabetes Mother   . Hypertension Mother   . COPD Mother   . Stroke Father   . Diabetes Father   . Hypertension Father   . Diabetes Sister   .  Drug abuse Brother   . Alcohol abuse Sister   . Colon cancer Neg Hx   . Colon polyps Neg Hx   . Esophageal cancer Neg Hx   . Rectal cancer Neg Hx   . Stomach cancer Neg Hx     Social History   Tobacco Use  . Smoking status: Current Every Day Smoker    Packs/day: 1.00    Types: Cigarettes  . Smokeless tobacco: Never Used  . Tobacco comment: PT REQUESTING PATCH  Substance Use Topics  . Alcohol use: No    Subjective:    I connected with [name] on 09/26/18 at  8:40 AM EDT by a video enabled telemedicine application and verified that I am speaking with the correct person using two identifiers. Patient and I are the only 2 people on the video call.    I discussed the limitations of evaluation and management by telemedicine and the availability of in person appointments. The patient  expressed understanding and agreed to proceed.  1) Was seen at the ER 3 days ago and diagnosed with Shingles; concerned that mis-diagnosed because not having pain; notes area of concern is "itchy" and responds to OTC Hydrocortisone; is taking the Acyclovir as prescribed.  2) Feels that she is allergic to Jardiance; having increased vaginal discomfort on the medication- actually stopped taking medication 2-3 weeks ago; not currently on anything for her diabetes; does not check her blood sugar regularly; last OV was in October 2019; Denies any chest pain, shortness of breath, blurred vision or headache.    Objective:  There were no vitals filed for this visit.  General: Well developed, well nourished, in no acute distress  Skin : Warm and dry. Vesicular patchy area noted on right breast c/w shingles;  Lungs: Respirations unlabored;  Neurologic: Alert and oriented; speech intact; face symmetrical;   Assessment:  1. Type 2 diabetes mellitus with complication, without long-term current use of insulin ([place])   2. Herpes zoster without complication     Plan:  1. Overdue for OV; patient agrees to come get her labs updated in the next 24 hours; will most likely plan to try Trulicity; risks and benefits discussed and patient was excited about this medication; follow-up to be determined. 2. Reassurance- agree with ER diagnosis and treatment; patient is improving- finish medications as prescribed.   No follow-ups on file.  Orders Placed This Encounter  Procedures  . CBC w/Diff    Standing Status:   Future    Standing Expiration Date:   09/26/2019  . Comp Met (CMET)    Standing Status:   Future    Standing Expiration Date:   09/26/2019  . Lipid panel    Standing Status:   Future    Standing Expiration Date:   09/26/2019  . HgB A1c    Standing Status:   Future    Standing Expiration Date:   09/26/2019    Requested Prescriptions    No prescriptions requested or ordered in this encounter

## 2018-09-27 ENCOUNTER — Other Ambulatory Visit (INDEPENDENT_AMBULATORY_CARE_PROVIDER_SITE_OTHER): Payer: Self-pay

## 2018-09-27 ENCOUNTER — Encounter: Payer: Self-pay | Admitting: Family

## 2018-09-27 DIAGNOSIS — E118 Type 2 diabetes mellitus with unspecified complications: Secondary | ICD-10-CM

## 2018-09-27 LAB — CBC WITH DIFFERENTIAL/PLATELET
Basophils Absolute: 0 10*3/uL (ref 0.0–0.1)
Basophils Relative: 0.5 % (ref 0.0–3.0)
Eosinophils Absolute: 0.1 10*3/uL (ref 0.0–0.7)
Eosinophils Relative: 1.1 % (ref 0.0–5.0)
HCT: 41.2 % (ref 36.0–46.0)
Hemoglobin: 13.9 g/dL (ref 12.0–15.0)
Lymphocytes Relative: 43.7 % (ref 12.0–46.0)
Lymphs Abs: 3.8 10*3/uL (ref 0.7–4.0)
MCHC: 33.8 g/dL (ref 30.0–36.0)
MCV: 82.4 fl (ref 78.0–100.0)
Monocytes Absolute: 0.7 10*3/uL (ref 0.1–1.0)
Monocytes Relative: 8.1 % (ref 3.0–12.0)
Neutro Abs: 4.1 10*3/uL (ref 1.4–7.7)
Neutrophils Relative %: 46.6 % (ref 43.0–77.0)
Platelets: 221 10*3/uL (ref 150.0–400.0)
RBC: 5 Mil/uL (ref 3.87–5.11)
RDW: 13.9 % (ref 11.5–15.5)
WBC: 8.8 10*3/uL (ref 4.0–10.5)

## 2018-09-27 LAB — COMPREHENSIVE METABOLIC PANEL
ALT: 15 U/L (ref 0–35)
AST: 12 U/L (ref 0–37)
Albumin: 4.2 g/dL (ref 3.5–5.2)
Alkaline Phosphatase: 103 U/L (ref 39–117)
BUN: 19 mg/dL (ref 6–23)
CO2: 24 mEq/L (ref 19–32)
Calcium: 9.4 mg/dL (ref 8.4–10.5)
Chloride: 108 mEq/L (ref 96–112)
Creatinine, Ser: 1.16 mg/dL (ref 0.40–1.20)
GFR: 58.13 mL/min — ABNORMAL LOW (ref >60.00)
Glucose, Bld: 164 mg/dL — ABNORMAL HIGH (ref 70–99)
Potassium: 4.6 mEq/L (ref 3.5–5.1)
Sodium: 142 mEq/L (ref 135–145)
Total Bilirubin: 0.5 mg/dL (ref 0.2–1.2)
Total Protein: 7.3 g/dL (ref 6.0–8.3)

## 2018-09-27 LAB — LIPID PANEL
Cholesterol: 230 mg/dL — ABNORMAL HIGH (ref 0–200)
HDL: 38.9 mg/dL — ABNORMAL LOW (ref >39.00)
LDL Cholesterol: 165 mg/dL — ABNORMAL HIGH (ref 0–99)
NonHDL: 191.14
Total CHOL/HDL Ratio: 6
Triglycerides: 132 mg/dL (ref 0.0–149.0)
VLDL: 26.4 mg/dL (ref 0.0–40.0)

## 2018-09-27 LAB — HEMOGLOBIN A1C: Hgb A1c MFr Bld: 7.7 % — ABNORMAL HIGH (ref 4.6–6.5)

## 2018-09-28 ENCOUNTER — Encounter: Payer: Self-pay | Admitting: Family

## 2018-09-28 ENCOUNTER — Encounter (HOSPITAL_COMMUNITY): Payer: Self-pay | Admitting: *Deleted

## 2018-09-28 ENCOUNTER — Other Ambulatory Visit: Payer: Self-pay | Admitting: Family

## 2018-09-28 ENCOUNTER — Emergency Department (HOSPITAL_COMMUNITY)
Admission: EM | Admit: 2018-09-28 | Discharge: 2018-09-28 | Disposition: A | Discharge status: Home / Self Care | Payer: Medicaid Other | Attending: Emergency Medicine | Admitting: Emergency Medicine

## 2018-09-28 ENCOUNTER — Other Ambulatory Visit: Payer: Self-pay

## 2018-09-28 DIAGNOSIS — Z7984 Long term (current) use of oral hypoglycemic drugs: Secondary | ICD-10-CM | POA: Insufficient documentation

## 2018-09-28 DIAGNOSIS — L731 Pseudofolliculitis barbae: Secondary | ICD-10-CM | POA: Insufficient documentation

## 2018-09-28 DIAGNOSIS — M62838 Other muscle spasm: Secondary | ICD-10-CM

## 2018-09-28 DIAGNOSIS — E119 Type 2 diabetes mellitus without complications: Secondary | ICD-10-CM | POA: Insufficient documentation

## 2018-09-28 DIAGNOSIS — Z79899 Other long term (current) drug therapy: Secondary | ICD-10-CM | POA: Insufficient documentation

## 2018-09-28 DIAGNOSIS — E7800 Pure hypercholesterolemia, unspecified: Secondary | ICD-10-CM

## 2018-09-28 DIAGNOSIS — F17210 Nicotine dependence, cigarettes, uncomplicated: Secondary | ICD-10-CM | POA: Insufficient documentation

## 2018-09-28 MED ORDER — MELOXICAM 7.5 MG PO TABS: 7.5000 mg | ORAL_TABLET | Freq: Every day | ORAL | 0 refills | Status: AC

## 2018-09-28 MED ORDER — SITAGLIPTIN PHOSPHATE 100 MG PO TABS: 100.0000 mg | ORAL_TABLET | Freq: Every day | ORAL | Status: DC

## 2018-09-28 MED ORDER — DULAGLUTIDE 0.75 MG/0.5ML ~~LOC~~ SOAJ: SUBCUTANEOUS | 1 refills | Status: DC

## 2018-09-28 MED ORDER — ATORVASTATIN CALCIUM 20 MG PO TABS: 20.0000 mg | ORAL_TABLET | Freq: Every day | ORAL | 1 refills | Status: DC

## 2018-09-28 NOTE — Discharge Instructions (Addendum)
Apply warm compresses to sore muscles. Take Meloxicam as prescribed for muscle pain.  Gentle stretching. Follow up with your doctor.

## 2018-09-28 NOTE — ED Triage Notes (Signed)
PT returns today with on going shingles on upper body. Pt also has swelling  In RT axilla  And RT medial upper thigh.

## 2018-09-28 NOTE — ED Provider Notes (Signed)
[facility] EMERGENCY DEPARTMENT Provider Note   CSN: [id number] Arrival date & time: 09/28/18  1311    History   Chief Complaint No chief complaint on file.   HPI Julie Dawson is a 58 y.o. female.     58 year old female presents with complaint of right shoulder pain and possible abscess on her right thigh.  Patient states that she was seen here earlier this week, diagnosed with shingles and is taking acyclovir as prescribed.  Patient is noticed a soreness in her right shoulder worse with movement of her arm.  No other complaints or concerns.     Past Medical History:  Diagnosis Date  . Allergy    spring time only   . Constipation    doesnt use otc medicines  . Diabetes mellitus without complication ([place])   . History of positive PPD    cannot do the tine test,  has to do CXR  . Hyperlipidemia     Patient Active Problem List   Diagnosis Date Noted  . Chronic bilateral low back pain without sciatica 02/04/2018  . Cigarette nicotine dependence with nicotine-induced disorder 07/13/2017  . Type 2 diabetes mellitus with complication, without long-term current use of insulin ([place]) 08/23/2016  . 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 [place] SKIN 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 06/08/2010  . INSOMNIA 12/31/2009  . HERPES LABIALIS 01/21/2009  . ONYCHOMYCOSIS 07/29/2008  . HYPERCHOLESTEROLEMIA 07/29/2008  . DENTAL CARIES 04/21/2008  . OBESITY 03/13/2008  . MENOPAUSAL SYNDROME 03/13/2008  . HEADACHE 03/13/2008  . DRUG ABUSE, HX OF 03/13/2008    Past Surgical History:  Procedure Laterality Date  . cervical adenitis node remova    . ECTOPIC PREGNANCY SURGERY    . MOUTH SURGERY     for wisdom teeth removal     OB History   No obstetric history on file.      Home Medications    Prior to Admission medications   Medication Sig Start Date End Date Taking? Authorizing Provider  acyclovir (ZOVIRAX) 400 MG tablet Take 1 tablet (400 mg total) by  mouth 4 (four) times daily. 09/23/18   Domenic Moras, PA-C  atorvastatin (LIPITOR) 20 MG tablet Take 1 tablet (20 mg total) by mouth daily at 6 PM. 09/28/18   Marrian Salvage, FNP  Blood Glucose Monitoring Suppl (ONE TOUCH ULTRA 2) w/Device KIT 1 each by Other route daily. Dx: E11.8 01/22/18   Lance Sell, NP  Dulaglutide (TRULICITY) 2.58 NI/7.7OE SOPN Inject once per weekly as directed 09/28/18   Marrian Salvage, FNP  glucose blood (ONE TOUCH ULTRA TEST) test strip 1 each by Other route daily. To check blood sugar. Dx: E11.8 01/22/18   Lance Sell, NP  Lancets (ONETOUCH ULTRASOFT) lancets 1 each by Other route daily. To check blood sugar. Dx: E11.8 01/22/18   Lance Sell, NP  meloxicam (MOBIC) 7.5 MG tablet Take 1 tablet (7.5 mg total) by mouth daily for 10 days. 09/28/18 10/08/18  Tacy Learn, PA-C    Family History Family History  Problem Relation Age of Onset  . Diabetes Mother   . Hypertension Mother   . COPD Mother   . Stroke Father   . Diabetes Father   . Hypertension Father   . Diabetes Sister   . Drug abuse Brother   . Alcohol abuse Sister   . Colon cancer Neg Hx   . Colon polyps Neg Hx   . Esophageal cancer Neg Hx   .  Rectal cancer Neg Hx   . Stomach cancer Neg Hx     Social History Social History   Tobacco Use  . Smoking status: Current Every Day Smoker    Packs/day: 1.00    Types: Cigarettes  . Smokeless tobacco: Never Used  . Tobacco comment: PT REQUESTING PATCH  Substance Use Topics  . Alcohol use: No  . 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     Allergies   Ace inhibitors; Jardiance [empagliflozin]; Lisinopril; and Metformin and related   Review of Systems Review of Systems  Constitutional: Negative for fever.  Respiratory: Negative for shortness of breath.   Cardiovascular: Negative for chest pain.  Musculoskeletal: Positive for arthralgias and myalgias. Negative for neck pain and neck  stiffness.  Skin: Positive for rash. Negative for wound.  Allergic/Immunologic: Positive for immunocompromised state.  All other systems reviewed and are negative.    Physical Exam Updated Vital Signs BP 120/69 (BP Location: Left Arm)   Pulse 95   Temp 99.1 F (37.3 C) (Oral)   Resp 16   Ht _0  (1.6 m)   Wt 73.9 kg   SpO2 100%   BMI 28.86 kg/m   Physical Exam Vitals signs and nursing note reviewed.  Constitutional:      General: She is not in acute distress.    Appearance: She is well-developed. She is not diaphoretic.  HENT:     Head: Normocephalic and atraumatic.  Pulmonary:     Effort: Pulmonary effort is normal.  Musculoskeletal:        General: Tenderness present. No swelling or deformity.     Right shoulder: She exhibits tenderness. She exhibits normal range of motion, no bony tenderness, no swelling, no effusion, no crepitus and no deformity.       Arms:     Comments: No appreciable abscess, swelling, redness involving the right axillary area.   Skin:    General: Skin is warm and dry.     Findings: Rash present.       Neurological:     Mental Status: She is alert and oriented to person, place, and time.  Psychiatric:        Behavior: Behavior normal.      ED Treatments / Results  Labs (all labs ordered are listed, but only abnormal results are displayed) Labs Reviewed - No data to display  EKG None  Radiology No results found.  Procedures Procedures (including critical care time)  Medications Ordered in ED Medications - No data to display   Initial Impression / Assessment and Plan / ED Course  I have reviewed the triage vital signs and the nursing notes.  Pertinent labs & imaging results that were available during my care of the patient were reviewed by me and considered in my medical decision making (see chart for details).  Clinical Course as of Sep 27 1404  Fri May 29, 538  6831 58 year old female with muscle soreness in the right  trapezius area, states she has been sleeping well due to the discomfort from her shingles.  Recommend Patient Take Meloxicam, Apply Warm Compresses gentle stretching exercises.  Recheck with PCP, return to ER for new or worsening symptoms.   [LM]    Clinical Course User Index [LM] Tacy Learn, PA-C      Final Clinical Impressions(s) / ED Diagnoses   Final diagnoses:  Muscle spasm    ED Discharge Orders  Ordered    meloxicam (MOBIC) 7.5 MG tablet  Daily     09/28/18 1335           Roque Lias 09/28/18 1406    Charlesetta Shanks, MD 09/29/18 [phone]

## 2018-09-28 NOTE — ED Notes (Signed)
Patient verbalizes understanding of discharge instructions . Opportunity for questions and answers were provided . Armband removed by staff ,Pt discharged from ED. W/C  offered at D/C  and Declined W/C at D/C and was escorted to lobby by RN.  

## 2018-10-09 ENCOUNTER — Encounter: Payer: Self-pay | Admitting: Family

## 2018-10-13 ENCOUNTER — Encounter: Payer: Self-pay | Admitting: Family

## 2018-10-15 ENCOUNTER — Encounter: Payer: Self-pay | Admitting: Family

## 2018-10-16 ENCOUNTER — Other Ambulatory Visit: Payer: Self-pay | Admitting: Family

## 2018-10-16 ENCOUNTER — Telehealth: Payer: Self-pay

## 2018-10-16 MED ORDER — TRULICITY 0.75 MG/0.5ML ~~LOC~~ SOAJ: SUBCUTANEOUS | 1 refills | Status: DC

## 2018-10-16 NOTE — Telephone Encounter (Signed)
Key: [id number]   Started request for Trulicity 7.48 mg today via cover my meds.

## 2018-10-16 NOTE — Telephone Encounter (Signed)
Approval issued today from [place] my meds. Call pharmacy and tell them it has been approved.

## 2018-10-16 NOTE — Telephone Encounter (Signed)
Opened in error

## 2018-10-21 ENCOUNTER — Emergency Department (HOSPITAL_COMMUNITY)
Admission: EM | Admit: 2018-10-21 | Discharge: 2018-10-21 | Disposition: A | Discharge status: Home / Self Care | Payer: 59 | Attending: Emergency Medicine | Admitting: Emergency Medicine

## 2018-10-21 ENCOUNTER — Other Ambulatory Visit: Payer: Self-pay

## 2018-10-21 ENCOUNTER — Encounter (HOSPITAL_COMMUNITY): Payer: Self-pay | Admitting: Emergency Medicine

## 2018-10-21 DIAGNOSIS — F17210 Nicotine dependence, cigarettes, uncomplicated: Secondary | ICD-10-CM | POA: Diagnosis not present

## 2018-10-21 DIAGNOSIS — E119 Type 2 diabetes mellitus without complications: Secondary | ICD-10-CM | POA: Insufficient documentation

## 2018-10-21 DIAGNOSIS — Z79899 Other long term (current) drug therapy: Secondary | ICD-10-CM | POA: Diagnosis not present

## 2018-10-21 DIAGNOSIS — R200 Anesthesia of skin: Secondary | ICD-10-CM

## 2018-10-21 DIAGNOSIS — R220 Localized swelling, mass and lump, head: Secondary | ICD-10-CM

## 2018-10-21 LAB — CBC WITH DIFFERENTIAL/PLATELET
Abs Immature Granulocytes: 0.04 10*3/uL (ref 0.00–0.07)
Basophils Absolute: 0 10*3/uL (ref 0.0–0.1)
Basophils Relative: 0 %
Eosinophils Absolute: 0.1 10*3/uL (ref 0.0–0.5)
Eosinophils Relative: 1 %
HCT: 41.4 % (ref 36.0–46.0)
Hemoglobin: 13.5 g/dL (ref 12.0–15.0)
Immature Granulocytes: 0 %
Lymphocytes Relative: 39 %
Lymphs Abs: 3.5 10*3/uL (ref 0.7–4.0)
MCH: 27.1 pg (ref 26.0–34.0)
MCHC: 32.6 g/dL (ref 30.0–36.0)
MCV: 83 fL (ref 80.0–100.0)
Monocytes Absolute: 0.6 10*3/uL (ref 0.1–1.0)
Monocytes Relative: 7 %
Neutro Abs: 4.8 10*3/uL (ref 1.7–7.7)
Neutrophils Relative %: 53 %
Platelets: 212 10*3/uL (ref 150–400)
RBC: 4.99 MIL/uL (ref 3.87–5.11)
RDW: 13.1 % (ref 11.5–15.5)
WBC: 9 10*3/uL (ref 4.0–10.5)
nRBC: 0 % (ref 0.0–0.2)

## 2018-10-21 LAB — BASIC METABOLIC PANEL
Anion gap: 7 (ref 5–15)
BUN: 15 mg/dL (ref 6–20)
CO2: 23 mmol/L (ref 22–32)
Calcium: 8.9 mg/dL (ref 8.9–10.3)
Chloride: 107 mmol/L (ref 98–111)
Creatinine, Ser: 1.08 mg/dL — ABNORMAL HIGH (ref 0.44–1.00)
GFR calc Af Amer: >60 mL/min (ref >60)
GFR calc non Af Amer: 57 mL/min — ABNORMAL LOW (ref >60)
Glucose, Bld: 142 mg/dL — ABNORMAL HIGH (ref 70–99)
Potassium: 4.1 mmol/L (ref 3.5–5.1)
Sodium: 137 mmol/L (ref 135–145)

## 2018-10-21 MED ORDER — METOCLOPRAMIDE HCL 5 MG/ML IJ SOLN
10.0000 mg | Freq: Once | INTRAMUSCULAR | Status: AC
  Administered 2018-10-21: 10 mg via INTRAMUSCULAR
  Filled 2018-10-21: qty 2

## 2018-10-21 MED ORDER — DIPHENHYDRAMINE HCL 50 MG/ML IJ SOLN
25.0000 mg | Freq: Once | INTRAMUSCULAR | Status: AC
  Administered 2018-10-21: 25 mg via INTRAMUSCULAR
  Filled 2018-10-21: qty 1

## 2018-10-21 NOTE — ED Triage Notes (Signed)
Pt arrives to ED from home with complaints of left sided facial numbness and facial swelling starting this morning when she woke up. Pts LKN was 9pm last night.

## 2018-10-21 NOTE — Discharge Instructions (Addendum)
You have been seen today for facial swelling and numbness. Please read and follow all provided instructions.   1. Medications: usual home medications 2. Treatment: rest, drink plenty of fluids 3. Follow Up: Please follow up with your primary doctor in 2 days for discussion of your diagnoses and further evaluation after today's visit; if you do not have a primary care doctor use the resource guide provided to find one; Please return to the ER for any new or worsening symptoms. Please obtain all of your results from medical records or have your doctors office obtain the results - share them with your doctor - you should be seen at your doctors office. Call today to arrange your follow up.   You should return to the ER if you develop severe or worsening symptoms.   Emergency Department Resource Guide 1) Find a Doctor and Pay Out of Pocket Although you won't have to find out who is covered by your insurance plan, it is a good idea to ask around and get recommendations. You will then need to call the office and see if the doctor you have chosen will accept you as a new patient and what types of options they offer for patients who are self-pay. Some doctors offer discounts or will set up payment plans for their patients who do not have insurance, but you will need to ask so you aren't surprised when you get to your appointment.  2) Contact Your Local Health Department Not all health departments have doctors that can see patients for sick visits, but many do, so it is worth a call to see if yours does. If you don't know where your local health department is, you can check in your phone book. The CDC also has a tool to help you locate your state's health department, and many state websites also have listings of all of their local health departments.  3) Find a Walk-in Clinic If your illness is not likely to be very severe or complicated, you may want to try a walk in clinic. These are popping up all over the  country in pharmacies, drugstores, and shopping centers. They're usually staffed by nurse practitioners or physician assistants that have been trained to treat common illnesses and complaints. They're usually fairly quick and inexpensive. However, if you have serious medical issues or chronic medical problems, these are probably not your best option.  No Primary Care Doctor: Call Health Connect at  [phone] - they can help you locate a primary care doctor that  accepts your insurance, provides certain services, etc. Physician Referral Service- [phone]  Chronic Pain Problems: Organization         Address  Phone   Notes  [facility]  [phone] Patients need to be referred by their primary care doctor.   Medication Assistance: Organization         Address  Phone   Notes  [facility] [place] [facility] [address] [place], [place] [id number] [phone] --Must be a resident of [place] -- Must have NO insurance coverage whatsoever (no Medicaid/ Medicare, etc.) -- The pt. MUST have a primary care doctor that directs their care regularly and follows them in the community   [facility]  [phone]   [employer]  [phone]    Agencies that provide inexpensive medical care: Organization         Address  Phone   Notes  [facility]  [phone]  [facility]    [phone]   [facility] [place], [place] [address] [phone]   [place] [address] [place] [phone]   [facility]    [phone]   [facility]    [phone]   [place] [address] [place], [place] Phone:  [phone], Fax:  [phone] Hours of Operation:  9 am - 6 pm, M-F.  Also accepts Medicaid/Medicare and self-pay.  [facility] for [place], [address],   Phone: [phone], Fax: [phone]. Hours of Operation:  8:30 am - 5:30 pm, M-F.  Also accepts Medicaid and self-pay.  [facility] [place] [address], [place] Phone: [phone]   [place], [place], [place] [phone], Ext. [phone] Mondays & Thursdays: 7-9 AM.  First 15 patients are seen on a first come, first serve basis.    [place] Providers:  Organization         Address  Phone   Notes  [facility] [address],  [phone] Also accepts self-pay patients.  [facility] [address], [place]  [phone]   [place], [address], [place] [phone]   [facility] [address] [place] [phone]   Lucianne Lei [address], [place]   [phone] Only accepts [place] patients after they have their name applied to their card.   Self-Pay (no insurance) in [facility]:  Organization         Address  Phone   Notes  Sickle Cell Patients, [facility] [address] [phone]   [facility] [place] [phone]   [facility] [place]  [place], [place], [place] [phone]   [facility]/Dr. Osei-Bonsu  [address] [place] or [address], [place] [phone] Phone number for both [place] and [place] locations is the same.  [facility] [address] [place] [phone]   [facility] [address] [place] or [address] [phone]   [facility] [address], [place] [phone], phone; [phone], fax Sees patients 1st and 3rd Saturday of every month.  Must not  qualify for public or private insurance (i.e. Medicaid, Medicare, [place] Health Choice, Veterans' Benefits)  Household income should be no more than 200% of the poverty level The clinic cannot treat you if you are pregnant or think you are pregnant  Sexually transmitted diseases are not treated at the clinic.

## 2018-10-21 NOTE — ED Notes (Signed)
Patient verbalizes understanding of discharge instructions . Opportunity for questions and answers were provided . Armband removed by staff ,Pt discharged from ED. W/C  offered at D/C  and Declined W/C at D/C and was escorted to lobby by RN.  

## 2018-10-21 NOTE — ED Provider Notes (Signed)
Medical screening examination/treatment/procedure(s) were conducted as a shared visit with non-physician practitioner(s) and myself.  I personally evaluated the patient during the encounter.    Patient has a "pressure sensation" over most of the left half of her head.  She reports she feels this sensation of pressure behind her eye, and her temple and perceives some swelling in the lower aspect of her face on the left.  Patient denies that there is any pain associated with this.  Reports that it feels a lot better if she presses above her eye and in the soft tissues below the brow.  It also feels good for her to put pressure on her temples.  She denies any associated symptoms.  There is been no fever, no chills, no nasal congestion, no sore throat, no visual changes, no double vision, no tearing.  Patient reports that she is getting married soon and has some added stress right now.  She reports that she wanted to make sure she is not having a stroke.  Patient is alert and clinically well in appearance.  She is in no distress.  To examination, I cannot perceive asymmetry or facial swelling.  Patient's eye exam is for symmetric pupils briskly responsive to light normal extraocular motions.  Patient reports it feels "really good" when I apply light steady pressure to the supraorbital area.  She also reports significant improvement and that it feels good if I push and massage on the temple, around the zygoma, behind the ear along the occiput and in the scalp.  Thus any massaging light pressure over all soft tissue areas of the left side of her head relieve symptoms.  She denies that it is equally as satisfying to have pressure applied to the same areas on the right.  Patient's neurologic exam is normal.  TMs are normal.  Patient has poor dentition with most of the teeth missing on the upper left side.  However, no areas are tender to palpation of the teeth or any intraoral swellings.  No trismus.  Heart and lung  exam is normal.  Based on the patient's exam and the significant improvement with palpation of any areas of tenderness or discomfort on her face, head or neck, I have very low suspicion for infectious etiology of soft tissues or deep space.  Also, this does not fit any stroke pattern.  Patient had shingles on the right chest.  Nowhere on the head and neck exam is there is a suggestion of lesions or a pain quality that would be consistent with shingles.  I empirically tried Reglan Benadryl for what appears most likely to be tension headache or possibly migraine equivalent although patient has no known history of migraines.  She got complete relief and is clinically well.  I feel she is stable for discharge with return precautions.   Charlesetta Shanks, MD 10/22/18 [phone]

## 2018-10-21 NOTE — ED Provider Notes (Signed)
[facility]   CSN: [id number] Arrival date & time: 10/21/18  0706    History   Chief Complaint Chief Complaint  Patient presents with  . Facial Swelling  . Numbness    HPI Burt EkYvette Osowski is a 58 y.o. female with a PMH of T2DM, HLD, and seasonal allergies presenting with left sided facial edema and numbness onset this morning upon waking up at 5:30am. Patient describes pressure on left aspect of face. Patient states she was at baseline last night at 9pm before going to bed. Patient states applying pressure and massage of face makes symptoms better and nothing makes symptoms worse. Patient states she had shingles 2 weeks ago on her chest with complete resolution and completed acyclovir treatment. Patient reports mild left lower dental pain. Patient denies weakness, syncope, eye pain, vision changes, ear pain, shortness of breath, chest pain, neck pain, or trouble speaking. Patient denies facial asymmetry, headaches, dizziness, or lightheadedness. Patient denies sick contacts or recent travel.      HPI  Past Medical History:  Diagnosis Date  . Allergy    spring time only   . Constipation    doesnt use otc medicines  . Diabetes mellitus without complication (HCC)   . History of positive PPD    cannot do the tine test,  has to do CXR  . Hyperlipidemia     Patient Active Problem List   Diagnosis Date Noted  . Chronic bilateral low back pain without sciatica 02/04/2018  . Cigarette nicotine dependence with nicotine-induced disorder 07/13/2017  . Type 2 diabetes mellitus with complication, without long-term current use of insulin (HCC) 08/23/2016  . 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 INSOMNIA 12/31/2009  . HERPES LABIALIS 01/21/2009  . ONYCHOMYCOSIS 07/29/2008  . HYPERCHOLESTEROLEMIA 07/29/2008  . DENTAL CARIES 04/21/2008  . OBESITY 03/13/2008  . MENOPAUSAL  SYNDROME 03/13/2008  . HEADACHE 03/13/2008  . DRUG ABUSE, HX OF 03/13/2008    Past Surgical History:  Procedure Laterality Date  . cervical adenitis node remova    . ECTOPIC PREGNANCY SURGERY    . MOUTH SURGERY     for wisdom teeth removal     OB History   No obstetric history on file.      Home Medications    Prior to Admission medications   Medication Sig Start Date End Date Taking? Authorizing Provider  atorvastatin (LIPITOR) 20 MG tablet Take 1 tablet (20 mg total) by mouth daily at 6 PM. 09/28/18  Yes Olive BassMurray, Laura Woodruff, FNP  Cinnamon 500 MG capsule Take 500 mg by mouth daily.   Yes [provider]  sitaGLIPtin (JANUVIA) 100 MG tablet Take 1 tablet (100 mg total) by mouth daily. 09/28/18  Yes Olive BassMurray, Laura Woodruff, FNP  acyclovir (ZOVIRAX) 400 MG tablet Take 1 tablet (400 mg total) by mouth 4 (four) times daily. Patient not taking: Reported on 10/21/2018 09/23/18   Fayrene Helperran, [place], PA-C  Dulaglutide (TRULICITY) 0.75 MG/0.5ML SOPN Inject weekly as directed Patient not taking: Reported on 10/21/2018 10/16/18   Olive BassMurray, Laura Woodruff, FNP    Family History Family History  Problem Relation Age of Onset  . Diabetes Mother   . Hypertension Mother   . COPD Mother   . Stroke Father   . Diabetes Father   . Hypertension Father   . Diabetes Sister   . Drug abuse Brother   . Alcohol abuse Sister   . Colon  cancer Neg Hx   . Colon polyps Neg Hx   . Esophageal cancer Neg Hx   . Rectal cancer Neg Hx   . Stomach cancer Neg Hx     Social History Social History   Tobacco Use  . Smoking status: Current Every Day Smoker    Packs/day: 1.00    Types: Cigarettes  . Smokeless tobacco: Never Used  . Tobacco comment: PT REQUESTING PATCH  Substance Use Topics  . Alcohol use: No  . 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     Allergies   Ace inhibitors, Jardiance [empagliflozin], Lisinopril, and Metformin and related   Review of Systems Review  of Systems  Constitutional: Negative for chills, diaphoresis and fever.  HENT: Positive for facial swelling. Negative for congestion, ear pain, sinus pressure, sinus pain, sore throat, trouble swallowing and voice change.   Eyes: Negative for pain and visual disturbance.  Respiratory: Negative for cough and shortness of breath.   Cardiovascular: Negative for chest pain.  Gastrointestinal: Negative for abdominal pain, nausea and vomiting.  Endocrine: Negative for cold intolerance and heat intolerance.  Musculoskeletal: Negative for back pain, neck pain and neck stiffness.  Skin: Negative for rash.  Allergic/Immunologic: Negative for immunocompromised state.  Neurological: Positive for numbness. Negative for dizziness, tremors, seizures, syncope, facial asymmetry, speech difficulty, weakness, light-headedness and headaches.  Hematological: Negative for adenopathy.    Physical Exam Updated Vital Signs BP (!) 159/83 (BP Location: Right Arm)   Pulse 65   Temp 98.7 F (37.1 C) (Oral)   Resp 16   SpO2 100%   Physical Exam Vitals signs and nursing note reviewed.  Constitutional:      General: She is not in acute distress.    Appearance: She is well-developed. She is not diaphoretic.  HENT:     Head: Normocephalic and atraumatic.     Comments: Symptoms improve with palpation of face.     Right Ear: Tympanic membrane, ear canal and external ear normal.     Left Ear: Tympanic membrane, ear canal and external ear normal.     Nose: Nose normal. No congestion or rhinorrhea.     Mouth/Throat:     Mouth: Mucous membranes are moist.     Dentition: Abnormal dentition. Does not have dentures. Dental caries present. No dental tenderness, gingival swelling, dental abscesses or gum lesions.     Pharynx: Oropharynx is clear. Uvula midline. No posterior oropharyngeal erythema.      Comments: No facial edema noted. Eyes:     General: No scleral icterus.    Extraocular Movements: Extraocular  movements intact.     Conjunctiva/sclera: Conjunctivae normal.     Pupils: Pupils are equal, round, and reactive to light.  Neck:     Musculoskeletal: Normal range of motion.  Cardiovascular:     Rate and Rhythm: Normal rate and regular rhythm.     Heart sounds: Normal heart sounds. No murmur. No friction rub. No gallop.   Pulmonary:     Effort: Pulmonary effort is normal. No respiratory distress.     Breath sounds: Normal breath sounds. No wheezing or rales.  Abdominal:     Palpations: Abdomen is soft.     Tenderness: There is no abdominal tenderness.  Musculoskeletal: Normal range of motion.  Skin:    General: Skin is warm.     Findings: No erythema or rash.  Neurological:     Mental Status: She is alert.    Mental Status:  Alert, oriented, thought content appropriate, able to give a coherent history. Speech fluent without evidence of aphasia. Able to follow 2 step commands without difficulty.  Cranial Nerves:  II:  Peripheral visual fields grossly normal, pupils equal, round, reactive to light III,IV, VI: ptosis not present, extra-ocular motions intact bilaterally  V,VII: smile symmetric, facial light touch sensation equal VIII: hearing grossly normal to voice  IX,X: symmetric elevation of soft palate, uvula elevates symmetrically  XI: bilateral shoulder shrug symmetric and strong XII: midline tongue extension without fassiculations Motor:  Normal tone. 5/5 in upper and lower extremities bilaterally including strong and equal grip strength and dorsiflexion/plantar flexion Sensory: Decreased sensation on left aspect of face. Light touch normal in all extremities.  Deep Tendon Reflexes: 2+ and symmetric in the biceps and patella Cerebellar: normal finger-to-nose with bilateral upper extremities Gait: normal gait and balance.  Negative pronator drift. Negative Romberg sign. CV: distal pulses palpable throughout    ED Treatments / Results  Labs (all labs ordered are listed,  but only abnormal results are displayed) Labs Reviewed  BASIC METABOLIC PANEL - Abnormal; Notable for the following components:      Result Value   Glucose, Bld 142 (*)    Creatinine, Ser 1.08 (*)    GFR calc non Af Amer 57 (*)    All other components within normal limits  CBC WITH DIFFERENTIAL/PLATELET    EKG None  Radiology No results found.  Procedures Procedures (including critical care time)  Medications Ordered in ED Medications  metoCLOPramide (REGLAN) injection 10 mg (10 mg Intramuscular Given 10/21/18 0955)  diphenhydrAMINE (BENADRYL) injection 25 mg (25 mg Intramuscular Given 10/21/18 0955)     Initial Impression / Assessment and Plan / ED Course  I have reviewed the triage vital signs and the nursing notes.  Pertinent labs & imaging results that were available during my care of the patient were reviewed by me and considered in my medical decision making (see chart for details).  Clinical Course as of Oct 20 1113  Sun Oct 21, 2018  0856 CBC is unremarkable.  CBC with Differential [AH]  1112 Reassessed patient. Patient reports symptoms have completely resolved. Patient is comfortable with being discharged at this time.   [AH]  1113 BMP reveals hyperglycemia at 142 and elevated creatinine at 1.08. Encouraged oral fluids.  Basic metabolic panel(!) [AH]    Clinical Course User Index [AH] Leretha DykesHernandez, Mayah Urquidi P, PA-C      Patient presents with facial numbness and edema. Neurological exam is normal except decreased sensation on left aspect of face. No facial edema noted on exam. Vitals and labs reviewed. CBC is unremarkable and patient is afebrile. No signs of dental abscess noted. Do not suspect an infectious cause at this time. Do not suspect stroke at this time based on history and physical exam. Suspect symptoms are likely due to a migraine/headache due to unusual history. Provided reglan and benadryl in the ER. Pt symptoms treated and improved while in ED.  Patient  reports symptoms completely resolved. Presentation is non concerning for [facility], ICH, Meningitis, or temporal arteritis. Pt is afebrile with no focal neuro deficits, nuchal rigidity, or change in vision. Pt is to follow up with PCP. Discussed strict return precautions. Pt verbalizes understanding and is agreeable with plan to dc.   Findings and plan of care discussed with supervising physician Dr. Donnald GarrePfeiffer who personally evaluated and examined this patient.  Final Clinical Impressions(s) / ED Diagnoses   Final diagnoses:  Facial swelling  Numbness    ED Discharge Orders    None       Leretha DykesHernandez, Torell Minder P, [place]PA-C 10/21/18 1120    Arby BarrettePfeiffer, Marcy, MD 10/22/18 [phone]

## 2018-12-05 ENCOUNTER — Encounter: Payer: Self-pay | Admitting: Family

## 2018-12-05 NOTE — Telephone Encounter (Signed)
Provider is in network. She will be staying with you

## 2018-12-07 ENCOUNTER — Encounter: Payer: Self-pay | Admitting: Family

## 2018-12-07 ENCOUNTER — Other Ambulatory Visit: Payer: Self-pay | Admitting: Family

## 2018-12-07 MED ORDER — TRULICITY 0.75 MG/0.5ML ~~LOC~~ SOAJ: SUBCUTANEOUS | 0 refills | Status: DC

## 2018-12-10 ENCOUNTER — Encounter: Payer: Self-pay | Admitting: Family

## 2018-12-12 ENCOUNTER — Encounter: Payer: Self-pay | Admitting: Family

## 2018-12-13 ENCOUNTER — Encounter: Payer: Self-pay | Admitting: Family

## 2018-12-14 ENCOUNTER — Telehealth: Payer: Self-pay

## 2018-12-14 ENCOUNTER — Other Ambulatory Visit: Payer: Self-pay

## 2018-12-14 ENCOUNTER — Ambulatory Visit (INDEPENDENT_AMBULATORY_CARE_PROVIDER_SITE_OTHER): Payer: Managed Care, Other (non HMO) | Admitting: Family

## 2018-12-14 ENCOUNTER — Encounter: Payer: Self-pay | Admitting: Family

## 2018-12-14 ENCOUNTER — Other Ambulatory Visit (INDEPENDENT_AMBULATORY_CARE_PROVIDER_SITE_OTHER): Payer: Managed Care, Other (non HMO)

## 2018-12-14 VITALS — BP 126/76 | HR 73 | Temp 98.2°F | Ht 63.0 in | Wt 167.1 lb

## 2018-12-14 DIAGNOSIS — Z23 Encounter for immunization: Secondary | ICD-10-CM

## 2018-12-14 DIAGNOSIS — E118 Type 2 diabetes mellitus with unspecified complications: Secondary | ICD-10-CM

## 2018-12-14 DIAGNOSIS — Z1159 Encounter for screening for other viral diseases: Secondary | ICD-10-CM

## 2018-12-14 DIAGNOSIS — Z1231 Encounter for screening mammogram for malignant neoplasm of breast: Secondary | ICD-10-CM

## 2018-12-14 DIAGNOSIS — Z1211 Encounter for screening for malignant neoplasm of colon: Secondary | ICD-10-CM

## 2018-12-14 DIAGNOSIS — J309 Allergic rhinitis, unspecified: Secondary | ICD-10-CM | POA: Diagnosis not present

## 2018-12-14 LAB — LIPID PANEL
Cholesterol: 163 mg/dL (ref 0–200)
HDL: 34.7 mg/dL — ABNORMAL LOW (ref >39.00)
LDL Cholesterol: 110 mg/dL — ABNORMAL HIGH (ref 0–99)
NonHDL: 128
Total CHOL/HDL Ratio: 5
Triglycerides: 92 mg/dL (ref 0.0–149.0)
VLDL: 18.4 mg/dL (ref 0.0–40.0)

## 2018-12-14 LAB — CBC WITH DIFFERENTIAL/PLATELET
Basophils Absolute: 0.1 10*3/uL (ref 0.0–0.1)
Basophils Relative: 0.7 % (ref 0.0–3.0)
Eosinophils Absolute: 0.1 10*3/uL (ref 0.0–0.7)
Eosinophils Relative: 1.1 % (ref 0.0–5.0)
HCT: 39.3 % (ref 36.0–46.0)
Hemoglobin: 13.3 g/dL (ref 12.0–15.0)
Lymphocytes Relative: 43.5 % (ref 12.0–46.0)
Lymphs Abs: 4.3 10*3/uL — ABNORMAL HIGH (ref 0.7–4.0)
MCHC: 33.8 g/dL (ref 30.0–36.0)
MCV: 83.2 fl (ref 78.0–100.0)
Monocytes Absolute: 0.6 10*3/uL (ref 0.1–1.0)
Monocytes Relative: 6.2 % (ref 3.0–12.0)
Neutro Abs: 4.8 10*3/uL (ref 1.4–7.7)
Neutrophils Relative %: 48.5 % (ref 43.0–77.0)
Platelets: 218 10*3/uL (ref 150.0–400.0)
RBC: 4.73 Mil/uL (ref 3.87–5.11)
RDW: 13.7 % (ref 11.5–15.5)
WBC: 9.8 10*3/uL (ref 4.0–10.5)

## 2018-12-14 LAB — COMPREHENSIVE METABOLIC PANEL
ALT: 19 U/L (ref 0–35)
AST: 15 U/L (ref 0–37)
Albumin: 4.5 g/dL (ref 3.5–5.2)
Alkaline Phosphatase: 95 U/L (ref 39–117)
BUN: 18 mg/dL (ref 6–23)
CO2: 26 mEq/L (ref 19–32)
Calcium: 9.3 mg/dL (ref 8.4–10.5)
Chloride: 105 mEq/L (ref 96–112)
Creatinine, Ser: 1.08 mg/dL (ref 0.40–1.20)
GFR: 63.08 mL/min (ref >60.00)
Glucose, Bld: 129 mg/dL — ABNORMAL HIGH (ref 70–99)
Potassium: 4 mEq/L (ref 3.5–5.1)
Sodium: 138 mEq/L (ref 135–145)
Total Bilirubin: 0.5 mg/dL (ref 0.2–1.2)
Total Protein: 7.3 g/dL (ref 6.0–8.3)

## 2018-12-14 LAB — MICROALBUMIN / CREATININE URINE RATIO
Creatinine,U: 78.2 mg/dL
Microalb Creat Ratio: 1.5 mg/g (ref 0.0–30.0)
Microalb, Ur: 1.2 mg/dL (ref 0.0–1.9)

## 2018-12-14 LAB — HEMOGLOBIN A1C: Hgb A1c MFr Bld: 7.1 % — ABNORMAL HIGH (ref 4.6–6.5)

## 2018-12-14 NOTE — Progress Notes (Signed)
Julie Dawson is a 58 y.o. female with the following history as recorded in [employer]:  Patient Active Problem List   Diagnosis Date Noted  . Chronic bilateral low back pain without sciatica 02/04/2018  . Cigarette nicotine dependence with nicotine-induced disorder 07/13/2017  . Type 2 diabetes mellitus with complication, without long-term current use of insulin ([place]) 08/23/2016  . 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 [place] SKIN 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 06/08/2010  . INSOMNIA 12/31/2009  . HERPES LABIALIS 01/21/2009  . ONYCHOMYCOSIS 07/29/2008  . HYPERCHOLESTEROLEMIA 07/29/2008  . DENTAL CARIES 04/21/2008  . OBESITY 03/13/2008  . MENOPAUSAL SYNDROME 03/13/2008  . HEADACHE 03/13/2008  . DRUG ABUSE, HX OF 03/13/2008    Current Outpatient Medications  Medication Sig Dispense Refill  . atorvastatin (LIPITOR) 20 MG tablet Take 1 tablet (20 mg total) by mouth daily at 6 PM. 90 tablet 1  . Dulaglutide (TRULICITY) 2.45 YK/9.9IP SOPN Inject weekly as directed 4 pen 0  . Cinnamon 500 MG capsule Take 500 mg by mouth daily.     No current facility-administered medications for this visit.     Allergies: Ace inhibitors, Jardiance [empagliflozin], Lisinopril, and Metformin and related  Past Medical History:  Diagnosis Date  . Allergy    spring time only   . Constipation    doesnt use otc medicines  . Diabetes mellitus without complication ([place])   . History of positive PPD    cannot do the tine test,  has to do CXR  . Hyperlipidemia     Past Surgical History:  Procedure Laterality Date  . cervical adenitis node remova    . ECTOPIC PREGNANCY SURGERY    . MOUTH SURGERY     for wisdom teeth removal    Family History  Problem Relation Age of Onset  . Diabetes Mother   . Hypertension Mother   . COPD Mother   . Stroke Father   . Diabetes Father   . Hypertension Father   . Diabetes Sister   . Drug abuse Brother   . Alcohol abuse Sister   . Colon cancer Neg Hx   .  Colon polyps Neg Hx   . Esophageal cancer Neg Hx   . Rectal cancer Neg Hx   . Stomach cancer Neg Hx     Social History   Tobacco Use  . Smoking status: Current Every Day Smoker    Packs/day: 1.00    Types: Cigarettes  . Smokeless tobacco: Never Used  Substance Use Topics  . Alcohol use: No    Subjective:  3 day history of headaches/ pressure in eyes; is concerned that is related to her blood pressure but blood pressure is very well controlled today; headache has since resolved- felt like pressure was localized over her front part of her head; does have seasonal allergies; no blurred vision or fever; took some allergy medication and laid down and woke up and "felt great."  Would also like to get her preventive care needs updated; now has health insurance in place; needs labs for her Trulicity- tolerating well; very pleased with her response.    Objective:  Vitals:   12/14/18 0922  BP: 126/76  Pulse: 73  Temp: 98.2 F (36.8 C)  TempSrc: Oral  SpO2: 99%  Weight: 167 lb 1.3 oz (75.8 kg)  Height: _0  (1.6 m)    General: Well developed, well nourished, in no acute distress  Skin : Warm and dry.  Head: Normocephalic and atraumatic  Eyes: Sclera  and conjunctiva clear; pupils round and reactive to light; extraocular movements intact  Ears: External normal; canals clear; tympanic membranes normal  Oropharynx: Pink, supple. No suspicious lesions  Neck: Supple without thyromegaly, adenopathy  Lungs: Respirations unlabored; clear to auscultation bilaterally without wheeze, rales, rhonchi  CVS exam: normal rate and regular rhythm.  Neurologic: Alert and oriented; speech intact; face symmetrical; moves all extremities well; CNII-XII intact without focal deficit    Assessment:  1. Allergic rhinitis, unspecified seasonality, unspecified trigger   2. Type 2 diabetes mellitus with complication, without long-term current use of insulin (HCC)   3. Screening mammogram, encounter for   4.  Need for hepatitis C screening test   5. Colon cancer screening     Plan:  1. Headache has now resolved; suspect migraine/ sinus type headache; 2. Check labs today; continue medication; follow-up in 4-6 months;she is encouraged to quit smoking. Prevnar given;  3. Order for mammogram updated;  4. Check Hep C antibody; 5. Patient defers colonoscopy- agrees to cologuard; order updated;    No follow-ups on file.  Orders Placed This Encounter  Procedures  . MM Digital Screening    Standing Status:   Future    Standing Expiration Date:   02/13/2020    Order Specific Question:   Reason for Exam (SYMPTOM  OR DIAGNOSIS REQUIRED)    Answer:   screening mammogram    Order Specific Question:   Is the patient pregnant?    Answer:   No    Order Specific Question:   Preferred imaging location?    Answer:   [facility]  . Pneumococcal conjugate vaccine 13-valent  . CBC w/Diff    Standing Status:   Future    Number of Occurrences:   1    Standing Expiration Date:   12/14/2019  . Comp Met (CMET)    Standing Status:   Future    Number of Occurrences:   1    Standing Expiration Date:   12/14/2019  . HgB A1c    Standing Status:   Future    Number of Occurrences:   1    Standing Expiration Date:   12/14/2019  . Lipid panel    Standing Status:   Future    Number of Occurrences:   1    Standing Expiration Date:   12/14/2019  . Urine Microalbumin w/creat. ratio    Standing Status:   Future    Number of Occurrences:   1    Standing Expiration Date:   12/14/2019  . Hepatitis C Antibody    Standing Status:   Future    Number of Occurrences:   1    Standing Expiration Date:   12/14/2019    Requested Prescriptions    No prescriptions requested or ordered in this encounter

## 2018-12-17 ENCOUNTER — Other Ambulatory Visit: Payer: Self-pay

## 2018-12-17 DIAGNOSIS — Z1211 Encounter for screening for malignant neoplasm of colon: Secondary | ICD-10-CM

## 2018-12-17 LAB — HEPATITIS C ANTIBODY
Hepatitis C Ab: NONREACTIVE
SIGNAL TO CUT-OFF: 0.01 (ref <1.00)

## 2018-12-17 NOTE — Telephone Encounter (Signed)
Ordered for patient today.

## 2018-12-28 LAB — COLOGUARD: Cologuard: NEGATIVE

## 2019-01-01 ENCOUNTER — Encounter: Payer: Self-pay | Admitting: Family

## 2019-01-01 NOTE — Progress Notes (Signed)
Outside notes received. Information abstracted. Notes sent to scan.  

## 2019-01-03 ENCOUNTER — Other Ambulatory Visit: Payer: Self-pay

## 2019-01-03 ENCOUNTER — Ambulatory Visit (INDEPENDENT_AMBULATORY_CARE_PROVIDER_SITE_OTHER): Payer: Managed Care, Other (non HMO)

## 2019-01-03 DIAGNOSIS — Z23 Encounter for immunization: Secondary | ICD-10-CM | POA: Diagnosis not present

## 2019-01-31 ENCOUNTER — Ambulatory Visit
Admission: RE | Admit: 2019-01-31 | Discharge: 2019-01-31 | Disposition: A | Discharge status: Home / Self Care | Payer: Managed Care, Other (non HMO) | Source: Ambulatory Visit | Attending: Family | Admitting: Family

## 2019-01-31 ENCOUNTER — Other Ambulatory Visit: Payer: Self-pay

## 2019-01-31 DIAGNOSIS — Z1231 Encounter for screening mammogram for malignant neoplasm of breast: Secondary | ICD-10-CM

## 2019-02-15 ENCOUNTER — Encounter: Payer: Self-pay | Admitting: Family

## 2019-02-15 ENCOUNTER — Other Ambulatory Visit: Payer: Self-pay | Admitting: Family

## 2019-02-15 MED ORDER — TRULICITY 0.75 MG/0.5ML ~~LOC~~ SOAJ: SUBCUTANEOUS | 1 refills | Status: DC

## 2019-04-06 ENCOUNTER — Encounter: Payer: Self-pay | Admitting: Family

## 2019-04-29 ENCOUNTER — Other Ambulatory Visit: Payer: Self-pay

## 2019-04-29 ENCOUNTER — Other Ambulatory Visit: Payer: Self-pay | Admitting: Internal Medicine

## 2019-04-29 ENCOUNTER — Encounter: Payer: Self-pay | Admitting: Internal Medicine

## 2019-04-29 ENCOUNTER — Ambulatory Visit (INDEPENDENT_AMBULATORY_CARE_PROVIDER_SITE_OTHER): Payer: Managed Care, Other (non HMO) | Admitting: Internal Medicine

## 2019-04-29 VITALS — BP 134/86 | HR 87 | Temp 98.4°F | Ht 63.0 in | Wt 167.0 lb

## 2019-04-29 DIAGNOSIS — R519 Headache, unspecified: Secondary | ICD-10-CM | POA: Insufficient documentation

## 2019-04-29 DIAGNOSIS — G4452 New daily persistent headache (NDPH): Secondary | ICD-10-CM

## 2019-04-29 DIAGNOSIS — R200 Anesthesia of skin: Secondary | ICD-10-CM | POA: Diagnosis not present

## 2019-04-29 DIAGNOSIS — E118 Type 2 diabetes mellitus with unspecified complications: Secondary | ICD-10-CM

## 2019-04-29 LAB — CBC
HCT: 40.7 % (ref 36.0–46.0)
Hemoglobin: 13.4 g/dL (ref 12.0–15.0)
MCHC: 33 g/dL (ref 30.0–36.0)
MCV: 83.8 fl (ref 78.0–100.0)
Platelets: 223 10*3/uL (ref 150.0–400.0)
RBC: 4.86 Mil/uL (ref 3.87–5.11)
RDW: 13.9 % (ref 11.5–15.5)
WBC: 8 10*3/uL (ref 4.0–10.5)

## 2019-04-29 LAB — COMPREHENSIVE METABOLIC PANEL
ALT: 18 U/L (ref 0–35)
AST: 15 U/L (ref 0–37)
Albumin: 4.4 g/dL (ref 3.5–5.2)
Alkaline Phosphatase: 108 U/L (ref 39–117)
BUN: 18 mg/dL (ref 6–23)
CO2: 25 mEq/L (ref 19–32)
Calcium: 9.4 mg/dL (ref 8.4–10.5)
Chloride: 106 mEq/L (ref 96–112)
Creatinine, Ser: 1.15 mg/dL (ref 0.40–1.20)
GFR: 58.59 mL/min — ABNORMAL LOW (ref >60.00)
Glucose, Bld: 101 mg/dL — ABNORMAL HIGH (ref 70–99)
Potassium: 4.1 mEq/L (ref 3.5–5.1)
Sodium: 138 mEq/L (ref 135–145)
Total Bilirubin: 0.6 mg/dL (ref 0.2–1.2)
Total Protein: 6.9 g/dL (ref 6.0–8.3)

## 2019-04-29 LAB — VITAMIN B12: Vitamin B-12: 290 pg/mL (ref 211–911)

## 2019-04-29 LAB — TSH: TSH: 2 u[IU]/mL (ref 0.35–4.50)

## 2019-04-29 LAB — VITAMIN D 25 HYDROXY (VIT D DEFICIENCY, FRACTURES): VITD: <7 ng/mL — ABNORMAL LOW (ref 30.00–100.00)

## 2019-04-29 LAB — SEDIMENTATION RATE: Sed Rate: 13 mm/hr (ref 0–30)

## 2019-04-29 LAB — HEMOGLOBIN A1C: Hgb A1c MFr Bld: 6.4 % (ref 4.6–6.5)

## 2019-04-29 MED ORDER — VITAMIN D (ERGOCALCIFEROL) 1.25 MG (50000 UNIT) PO CAPS: 50000.0000 [IU] | ORAL_CAPSULE | ORAL | 0 refills | Status: DC

## 2019-04-29 NOTE — Assessment & Plan Note (Signed)
Checking HgA1c, TSH, free T4, B12. Treat as appropriate. Known diabetes but previously good control.

## 2019-04-29 NOTE — Progress Notes (Signed)
   Subjective:   Patient ID: Julie Dawson, female    DOB: Sep 03, 1960, 58 y.o.   MRN: [id number]  HPI The patient is a 58 YO female coming in for concerns about neck stiffness and pain (pain starts behind ear and goes up into the temporal region and eye, usually 2-3 times per week, denies vision changes or light sensitivity with it, can massage the area firmly and the pain disappears in about 30 minutes, if she does not do this it can last all day, denies fevers or chills, just on the right side, started a couple months ago, denies triggers, typically wakes up with the pain, denies trying anything for it) and diabetes (taking trulicity and was not sure if that could be changing her urine, sometimes it is dark, does not always drink enough fluids, denies missing trulicity, feels as though this is helping her lose weight, last HgA1c 7.1) and numbness in finger (right hand there is one finger with numbness in the tip, started several months ago abruptly, has prior drug abuse and is worried about strokes due to prior imaging with some narrowing in the blood vessels).    Review of Systems  Constitutional: Negative.   HENT: Negative.   Eyes: Negative.   Respiratory: Negative for cough, chest tightness and shortness of breath.   Cardiovascular: Negative for chest pain, palpitations and leg swelling.  Gastrointestinal: Negative for abdominal distention, abdominal pain, constipation, diarrhea, nausea and vomiting.  Genitourinary:       Dark urine, dark yellow not brown or black  Musculoskeletal: Positive for myalgias.  Skin: Negative.   Neurological: Positive for numbness and headaches.  Psychiatric/Behavioral: Negative.     Objective:  Physical Exam Constitutional:      Appearance: She is well-developed.  HENT:     Head: Normocephalic and atraumatic.  Cardiovascular:     Rate and Rhythm: Normal rate and regular rhythm.  Pulmonary:     Effort: Pulmonary effort is normal. No respiratory  distress.     Breath sounds: Normal breath sounds. No wheezing or rales.  Abdominal:     General: Bowel sounds are normal. There is no distension.     Palpations: Abdomen is soft.     Tenderness: There is no abdominal tenderness. There is no rebound.  Musculoskeletal:        General: Tenderness present.     Cervical back: Normal range of motion.     Comments: Some right temporal tenderness to palpation  Skin:    General: Skin is warm and dry.  Neurological:     Mental Status: She is alert and oriented to person, place, and time.     Coordination: Coordination normal.     Vitals:   04/29/19 0848  BP: 134/86  Pulse: 87  Temp: 98.4 F (36.9 C)  TempSrc: Oral  SpO2: 98%  Weight: 167 lb (75.8 kg)  Height: 5\' 3"  (1.6 m)    This visit occurred during the SARS-CoV-2 public health emergency.  Safety protocols were in place, including screening questions prior to the visit, additional usage of staff PPE, and extensive cleaning of exam room while observing appropriate contact time as indicated for disinfecting solutions.   Assessment & Plan:

## 2019-04-29 NOTE — Assessment & Plan Note (Signed)
Checking HgA1c today, taking trulicity. Unclear control. New numbness in finger unclear if related.

## 2019-04-29 NOTE — Patient Instructions (Signed)
We will check the labs today and get the CT scan of the head.  Work on trying these exercises to help.   Shoulder Range of Motion Exercises Shoulder range of motion (ROM) exercises are done to keep the shoulder moving freely or to increase movement. They are often recommended for people who have shoulder pain or stiffness or who are recovering from a shoulder surgery. Phase 1 exercises When you are able, do this exercise 1-2 times per day for 30-60 seconds in each direction, or as directed by your health care provider. Pendulum exercise To do this exercise while sitting: 1. Sit in a chair or at the edge of your bed with your feet flat on the floor. 2. Let your affected arm hang down in front of you over the edge of the bed or chair. 3. Relax your shoulder, arm, and hand. Portal your body so your arm gently swings in small circles. You can also use your unaffected arm to start the motion. 5. Repeat changing the direction of the circles, swinging your arm left and right, and swinging your arm forward and back. To do this exercise while standing: 1. Stand next to a sturdy chair or table, and hold on to it with your hand on your unaffected side. 2. Bend forward at the waist. 3. Bend your knees slightly. 4. Relax your shoulder, arm, and hand. 5. While keeping your shoulder relaxed, use body motion to swing your arm in small circles. 6. Repeat changing the direction of the circles, swinging your arm left and right, and swinging your arm forward and back. 7. Between exercises, stand up tall and take a short break to relax your lower back.  Phase 2 exercises Do these exercises 1-2 times per day or as told by your health care provider. Hold each stretch for 30 seconds, and repeat 3 times. Do the exercises with one or both arms as instructed by your health care provider. For these exercises, sit at a table with your hand and arm supported by the table. A chair that slides easily or has wheels can  be helpful. External rotation 1. Turn your chair so that your affected side is nearest to the table. 2. Place your forearm on the table to your side. Bend your elbow about 90 at the elbow (right angle) and place your hand palm facing down on the table. Your elbow should be about 6 inches away from your side. 3. Keeping your arm on the table, lean your body forward. Abduction 1. Turn your chair so that your affected side is nearest to the table. 2. Place your forearm and hand on the table so that your thumb points toward the ceiling and your arm is straight out to your side. 3. Slide your hand out to the side and away from you, using your unaffected arm to do the work. 4. To increase the stretch, you can slide your chair away from the table. Flexion: forward stretch 1. Sit facing the table. Place your hand and elbow on the table in front of you. 2. Slide your hand forward and away from you, using your unaffected arm to do the work. 3. To increase the stretch, you can slide your chair backward. Phase 3 exercises Do these exercises 1-2 times per day or as told by your health care provider. Hold each stretch for 30 seconds, and repeat 3 times. Do the exercises with one or both arms as instructed by your health care provider. Cross-body stretch: posterior capsule  stretch 1. Lift your arm straight out in front of you. 2. Bend your arm 90 at the elbow (right angle) so your forearm moves across your body. 3. Use your other arm to gently pull the elbow across your body, toward your other shoulder. Wall climbs 1. Stand with your affected arm extended out to the side with your hand resting on a door frame. 2. Slide your hand slowly up the door frame. 3. To increase the stretch, step through the door frame. Keep your body upright and do not lean. Wand exercises You will need a cane, a piece of PVC pipe, or a sturdy wooden dowel for wand exercises. Flexion To do this exercise while standing: 1. Hold  the wand with both of your hands, palms down. 2. Using the other arm to help, lift your arms up and over your head, if able. 3. Push upward with your other arm to gently increase the stretch. To do this exercise while lying down: 1. Lie on your back with your elbows resting on the floor and the wand in both your hands. Your hands will be palm down, or pointing toward your feet. 2. Lift your hands toward the ceiling, using your unaffected arm to help if needed. 3. Bring your arms overhead as able, using your unaffected arm to help if needed. Internal rotation 1. Stand while holding the wand behind you with both hands. Your unaffected arm should be extended above your head with the arm of the affected side extended behind you at the level of your waist. The wand should be pointing straight up and down as you hold it. 2. Slowly pull the wand up behind your back by straightening the elbow of your unaffected arm and bending the elbow of your affected arm. External rotation 1. Lie on your back with your affected upper arm supported on a small pillow or rolled towel. When you first do this exercise, keep your upper arm close to your body. Over time, bring your arm up to a 90 angle out to the side. 2. Hold the wand across your stomach and with both hands palm up. Your elbow on your affected side should be bent at a 90 angle. 3. Use your unaffected side to help push your forearm away from you and toward the floor. Keep your elbow on your affected side bent at a 90 angle. Contact a health care provider if you have:  New or increasing pain.  New numbness, tingling, weakness, or discoloration in your arm or hand. This information is not intended to replace advice given to you by your health care provider. Make sure you discuss any questions you have with your health care provider. Document Released: 01/15/2003 Document Revised: 05/31/2017 Document Reviewed: 05/31/2017 Elsevier Patient Education  2020  [employer].

## 2019-04-29 NOTE — Assessment & Plan Note (Signed)
Checking ESR and CT head. Given stretching exercises for the neck and shoulders as she has muscle tightness which is likely causing on exam.

## 2019-05-08 ENCOUNTER — Encounter: Payer: Self-pay | Admitting: Family

## 2019-05-08 ENCOUNTER — Other Ambulatory Visit: Payer: Self-pay

## 2019-05-08 ENCOUNTER — Ambulatory Visit
Admission: RE | Admit: 2019-05-08 | Discharge: 2019-05-08 | Disposition: A | Discharge status: Home / Self Care | Payer: Managed Care, Other (non HMO) | Source: Ambulatory Visit | Attending: Internal Medicine | Admitting: Internal Medicine

## 2019-05-08 DIAGNOSIS — G4452 New daily persistent headache (NDPH): Secondary | ICD-10-CM

## 2019-05-09 ENCOUNTER — Encounter: Payer: Self-pay | Admitting: Internal Medicine

## 2019-05-10 ENCOUNTER — Other Ambulatory Visit: Payer: Self-pay | Admitting: Family

## 2019-05-14 ENCOUNTER — Encounter: Payer: Self-pay | Admitting: Family

## 2019-05-19 ENCOUNTER — Encounter: Payer: Self-pay | Admitting: Family

## 2019-05-21 ENCOUNTER — Encounter: Payer: Self-pay | Admitting: Family

## 2019-06-04 ENCOUNTER — Encounter: Payer: Self-pay | Admitting: Family

## 2019-06-25 ENCOUNTER — Other Ambulatory Visit: Payer: Self-pay | Admitting: Family

## 2019-06-25 ENCOUNTER — Encounter: Payer: Self-pay | Admitting: Family

## 2019-06-25 DIAGNOSIS — E7800 Pure hypercholesterolemia, unspecified: Secondary | ICD-10-CM

## 2019-06-25 MED ORDER — ATORVASTATIN CALCIUM 20 MG PO TABS: 20.0000 mg | ORAL_TABLET | Freq: Every day | ORAL | 1 refills | Status: DC

## 2019-06-25 MED ORDER — VITAMIN D (ERGOCALCIFEROL) 1.25 MG (50000 UNIT) PO CAPS: 50000.0000 [IU] | ORAL_CAPSULE | ORAL | 0 refills | Status: DC

## 2019-07-03 ENCOUNTER — Encounter: Payer: Self-pay | Admitting: Family

## 2019-07-11 ENCOUNTER — Encounter: Payer: Self-pay | Admitting: Family

## 2019-07-12 DIAGNOSIS — E119 Type 2 diabetes mellitus without complications: Secondary | ICD-10-CM | POA: Diagnosis not present

## 2019-07-22 ENCOUNTER — Encounter: Payer: Self-pay | Admitting: Family

## 2019-07-22 ENCOUNTER — Other Ambulatory Visit: Payer: Self-pay | Admitting: Family

## 2019-07-22 MED ORDER — ACYCLOVIR 200 MG PO CAPS: 200.0000 mg | ORAL_CAPSULE | Freq: Every day | ORAL | 0 refills | Status: AC

## 2019-09-28 ENCOUNTER — Encounter: Payer: Self-pay | Admitting: Family

## 2019-10-01 ENCOUNTER — Other Ambulatory Visit: Payer: Self-pay | Admitting: Family

## 2019-10-01 DIAGNOSIS — E7800 Pure hypercholesterolemia, unspecified: Secondary | ICD-10-CM

## 2019-10-01 MED ORDER — TRULICITY 0.75 MG/0.5ML ~~LOC~~ SOAJ: SUBCUTANEOUS | 0 refills | Status: DC

## 2019-10-01 MED ORDER — ATORVASTATIN CALCIUM 20 MG PO TABS: 20.0000 mg | ORAL_TABLET | Freq: Every day | ORAL | 0 refills | Status: DC

## 2019-10-04 ENCOUNTER — Encounter: Payer: Self-pay | Admitting: Family

## 2019-10-23 ENCOUNTER — Encounter: Payer: Self-pay | Admitting: Family

## 2019-10-24 ENCOUNTER — Encounter: Payer: Self-pay | Admitting: Family

## 2019-10-25 ENCOUNTER — Encounter: Payer: Self-pay | Admitting: Family

## 2019-10-25 ENCOUNTER — Ambulatory Visit: Payer: Managed Care, Other (non HMO) | Admitting: Family

## 2019-10-25 ENCOUNTER — Other Ambulatory Visit: Payer: Self-pay

## 2019-10-25 ENCOUNTER — Ambulatory Visit (INDEPENDENT_AMBULATORY_CARE_PROVIDER_SITE_OTHER): Payer: BC Managed Care – PPO | Admitting: Family

## 2019-10-25 VITALS — BP 138/76 | HR 99 | Temp 98.1°F | Wt 168.2 lb

## 2019-10-25 DIAGNOSIS — Z720 Tobacco use: Secondary | ICD-10-CM | POA: Diagnosis not present

## 2019-10-25 DIAGNOSIS — E118 Type 2 diabetes mellitus with unspecified complications: Secondary | ICD-10-CM

## 2019-10-25 DIAGNOSIS — E559 Vitamin D deficiency, unspecified: Secondary | ICD-10-CM

## 2019-10-25 DIAGNOSIS — E538 Deficiency of other specified B group vitamins: Secondary | ICD-10-CM | POA: Diagnosis not present

## 2019-10-25 LAB — VITAMIN D 25 HYDROXY (VIT D DEFICIENCY, FRACTURES): VITD: 30.99 ng/mL (ref 30.00–100.00)

## 2019-10-25 LAB — VITAMIN B12: Vitamin B-12: 318 pg/mL (ref 211–911)

## 2019-10-25 LAB — HEMOGLOBIN A1C: Hgb A1c MFr Bld: 6.9 % — ABNORMAL HIGH (ref 4.6–6.5)

## 2019-10-25 NOTE — Progress Notes (Signed)
Julie Dawson is a 59 y.o. female with the following history as recorded in [employer]:  Patient Active Problem List   Diagnosis Date Noted  . Temporal pain 04/29/2019  . Numbness of fingers of both hands 04/29/2019  . Chronic bilateral low back pain without sciatica 02/04/2018  . Cigarette nicotine dependence with nicotine-induced disorder 07/13/2017  . Type 2 diabetes mellitus with complication, without long-term current use of insulin ([place]) 08/23/2016  . 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 [place] SKIN 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 06/08/2010  . INSOMNIA 12/31/2009  . HERPES LABIALIS 01/21/2009  . ONYCHOMYCOSIS 07/29/2008  . HYPERCHOLESTEROLEMIA 07/29/2008  . DENTAL CARIES 04/21/2008  . OBESITY 03/13/2008  . MENOPAUSAL SYNDROME 03/13/2008  . HEADACHE 03/13/2008  . DRUG ABUSE, HX OF 03/13/2008    Current Outpatient Medications  Medication Sig Dispense Refill  . atorvastatin (LIPITOR) 20 MG tablet Take 1 tablet (20 mg total) by mouth daily at 6 PM. 90 tablet 0  . Cinnamon 500 MG capsule Take 500 mg by mouth daily.    . Dulaglutide (TRULICITY) 7.10 GY/6.9SW SOPN Inject weekly as directed 4 pen 0   No current facility-administered medications for this visit.    Allergies: Ace inhibitors, Jardiance [empagliflozin], Lisinopril, and Metformin and related  Past Medical History:  Diagnosis Date  . Allergy    spring time only   . Constipation    doesnt use otc medicines  . Diabetes mellitus without complication ([place])   . History of positive PPD    cannot do the tine test,  has to do CXR  . Hyperlipidemia     Past Surgical History:  Procedure Laterality Date  . cervical adenitis node remova    . ECTOPIC PREGNANCY SURGERY    . MOUTH SURGERY     for wisdom teeth removal    Family History  Problem Relation Age of Onset  . Diabetes Mother   . Hypertension Mother   . COPD Mother   . Stroke Father   . Diabetes Father   . Hypertension Father   . Diabetes Sister   .  Drug abuse Brother   . Alcohol abuse Sister   . Colon cancer Neg Hx   . Colon polyps Neg Hx   . Esophageal cancer Neg Hx   . Rectal cancer Neg Hx   . Stomach cancer Neg Hx     Social History   Tobacco Use  . Smoking status: Current Every Day Smoker    Packs/day: 1.00    Types: Cigarettes  . Smokeless tobacco: Never Used  Substance Use Topics  . Alcohol use: No    Subjective:  Presents for follow-up on chronic care needs- would like to get labs updated today; doing well on Trulicity; is still smoking and knows she needs to quit; Overdue to see her GYN and planning to schedule follow-up;    Objective:  Vitals:   10/25/19 1530  BP: 138/76  Pulse: 99  Temp: 98.1 F (36.7 C)  TempSrc: Oral  SpO2: 97%  Weight: 168 lb 3.2 oz (76.3 kg)    General: Well developed, well nourished, in no acute distress  Skin : Warm and dry.  Head: Normocephalic and atraumatic  Lungs: Respirations unlabored; clear to auscultation bilaterally without wheeze, rales, rhonchi  CVS exam: normal rate and regular rhythm.  Neurologic: Alert and oriented; speech intact; face symmetrical; moves all extremities well; CNII-XII intact without focal deficit   Assessment:  1. Type 2 diabetes mellitus with complication, without long-term  current use of insulin ([facility])   2. Vitamin D deficiency   3. Low vitamin B12 level   4. Tobacco abuse     Plan:  1. Check Hgba1c today; will also need to get lipid panel updated; 2. Check Vitamin D level today; 3. Patient did not understand to start taking daily OTC B12 supplements; will re-check level today; 4. Stressed need to quit smoking. Patient notes she is not ready;  This visit occurred during the SARS-CoV-2 public health emergency.  Safety protocols were in place, including screening questions prior to the visit, additional usage of staff PPE, and extensive cleaning of exam room while observing appropriate contact time as indicated for disinfecting solutions.      No follow-ups on file.  Orders Placed This Encounter  Procedures  . B12  . Vitamin D (25 hydroxy)  . HgB A1c    Requested Prescriptions    No prescriptions requested or ordered in this encounter

## 2019-10-28 ENCOUNTER — Encounter: Payer: Self-pay | Admitting: Family

## 2019-11-12 ENCOUNTER — Encounter: Payer: Self-pay | Admitting: Family

## 2019-11-14 ENCOUNTER — Encounter: Payer: Self-pay | Admitting: Family

## 2019-11-18 ENCOUNTER — Encounter: Payer: Self-pay | Admitting: Family

## 2019-11-18 NOTE — Telephone Encounter (Signed)
Update    [facility] patient assistance program    Fax # [phone] asking can all information in the package be fax over.

## 2019-12-02 ENCOUNTER — Encounter: Payer: Self-pay | Admitting: Family

## 2019-12-03 ENCOUNTER — Ambulatory Visit (INDEPENDENT_AMBULATORY_CARE_PROVIDER_SITE_OTHER): Payer: BC Managed Care – PPO | Admitting: Internal Medicine

## 2019-12-03 ENCOUNTER — Encounter: Payer: Self-pay | Admitting: Internal Medicine

## 2019-12-03 ENCOUNTER — Other Ambulatory Visit: Payer: Self-pay

## 2019-12-03 VITALS — BP 132/74 | HR 98 | Temp 98.3°F | Resp 16 | Ht 63.0 in | Wt 170.0 lb

## 2019-12-03 DIAGNOSIS — L230 Allergic contact dermatitis due to metals: Secondary | ICD-10-CM | POA: Insufficient documentation

## 2019-12-03 DIAGNOSIS — B352 Tinea manuum: Secondary | ICD-10-CM | POA: Diagnosis not present

## 2019-12-03 MED ORDER — CICLOPIROX OLAMINE 0.77 % EX CREA: TOPICAL_CREAM | Freq: Two times a day (BID) | CUTANEOUS | 1 refills | Status: DC

## 2019-12-03 MED ORDER — FLUOCINONIDE 0.05 % EX CREA: 1.0000 "application " | TOPICAL_CREAM | Freq: Two times a day (BID) | CUTANEOUS | 1 refills | Status: DC

## 2019-12-03 NOTE — Progress Notes (Signed)
Subjective:  Patient ID: Julie Dawson, female    DOB: [birth year]  Age: 59 y.o. MRN: [id number]  CC: Rash  This visit occurred during the SARS-CoV-2 public health emergency.  Safety protocols were in place, including screening questions prior to the visit, additional usage of staff PPE, and extensive cleaning of exam room while observing appropriate contact time as indicated for disinfecting solutions.    HPI Tisha Cline presents for f/up - She is concerned about her left ring finger.  She has a several week history of itching and pale discoloration under the surface of a wedding ring.  She has not treated this.  Outpatient Medications Prior to Visit  Medication Sig Dispense Refill  . atorvastatin (LIPITOR) 20 MG tablet Take 1 tablet (20 mg total) by mouth daily at 6 PM. 90 tablet 0  . Cinnamon 500 MG capsule Take 500 mg by mouth daily.    . Dulaglutide (TRULICITY) 0.75 MG/0.5ML SOPN Inject weekly as directed 4 pen 0   No facility-administered medications prior to visit.    ROS Review of Systems  Constitutional: Negative for chills, fatigue and fever.  HENT: Negative.   Eyes: Negative.   Respiratory: Negative for cough and shortness of breath.   Cardiovascular: Negative for chest pain, palpitations and leg swelling.  Gastrointestinal: Negative for abdominal pain.  Endocrine: Negative.   Genitourinary: Negative.  Negative for difficulty urinating.  Musculoskeletal: Negative.   Skin: Positive for color change and rash.  Hematological: Negative for adenopathy. Does not bruise/bleed easily.    Objective:  BP 132/74 (BP Location: Left Arm, Patient Position: Sitting, Cuff Size: Large)   Pulse 98   Temp 98.3 F (36.8 C) (Oral)   Resp 16   Ht 5\' 3"  (1.6 m)   Wt 170 lb (77.1 kg)   SpO2 98%   BMI 30.11 kg/m   BP Readings from Last 3 Encounters:  12/03/19 132/74  10/25/19 138/76  04/29/19 134/86    Wt Readings from Last 3 Encounters:  12/03/19 170 lb (77.1  kg)  10/25/19 168 lb 3.2 oz (76.3 kg)  04/29/19 167 lb (75.8 kg)    Physical Exam Musculoskeletal:     Right hand: Normal.     Comments: Left ring finger, proximal phalanx, dorsal side, associated with contact with the ring, there is an area of hypopigmentation and scale. See photo.     Lab Results  Component Value Date   WBC 8.0 04/29/2019   HGB 13.4 04/29/2019   HCT 40.7 04/29/2019   PLT 223.0 04/29/2019   GLUCOSE 101 (H) 04/29/2019   CHOL 163 12/14/2018   TRIG 92.0 12/14/2018   HDL 34.70 (L) 12/14/2018   LDLCALC 110 (H) 12/14/2018   ALT 18 04/29/2019   AST 15 04/29/2019   NA 138 04/29/2019   K 4.1 04/29/2019   CL 106 04/29/2019   CREATININE 1.15 04/29/2019   BUN 18 04/29/2019   CO2 25 04/29/2019   TSH 2.00 04/29/2019   HGBA1C 6.9 (H) 10/25/2019   MICROALBUR 1.2 12/14/2018    CT Head Wo Contrast  Result Date: 05/08/2019 CLINICAL DATA:  Headache right temporal region for several months EXAM: CT HEAD WITHOUT CONTRAST TECHNIQUE: Contiguous axial images were obtained from the base of the skull through the vertex without intravenous contrast. COMPARISON:  Sep 06, 2015 FINDINGS: Brain: Ventricles are normal in size and configuration. There is no intracranial mass, hemorrhage, extra-axial fluid collection, or midline shift. Brain parenchyma appears unremarkable. No evident acute infarct.  Vascular: No hyperdense vessel. No appreciable vascular calcification. Skull: The bony calvarium appears intact. Sinuses/Orbits: There is mucosal thickening in inferior frontal sinuses. Note that frontal sinuses are hypoplastic. Other visualized paranasal sinuses are clear. Orbits appear symmetric bilaterally. Other: Mastoid air cells are clear. IMPRESSION: Mucosal thickening in inferior frontal sinuses. Study otherwise unremarkable. Electronically Signed   By: Bretta Bang III M.D.   On: 05/08/2019 11:08    Assessment & Plan:   Georgeana was seen today for rash.  Diagnoses and all orders for  this visit:  Tinea manus- I am also concerned about secondary infection with yeast.  Will treat with ciclopirox. -     ciclopirox (LOPROX) 0.77 % cream; Apply topically 2 (two) times daily.  Allergic contact dermatitis due to metals- I think she is allergic to one of the alloys in the ring.  She agrees to stop wearing it.  Will treat with topical fluocinonide. -     fluocinonide cream (LIDEX) 0.05 %; Apply 1 application topically 2 (two) times daily.   I am having Jullisa A. Rogers start on fluocinonide cream and ciclopirox. I am also having her maintain her Cinnamon, atorvastatin, and Trulicity.  Meds ordered this encounter  Medications  . fluocinonide cream (LIDEX) 0.05 %    Sig: Apply 1 application topically 2 (two) times daily.    Dispense:  60 g    Refill:  1  . ciclopirox (LOPROX) 0.77 % cream    Sig: Apply topically 2 (two) times daily.    Dispense:  90 g    Refill:  1     Follow-up: Return in about 6 weeks (around 01/14/2020).  Sanda Linger, MD

## 2019-12-03 NOTE — Patient Instructions (Signed)
Contact Dermatitis Dermatitis is redness, soreness, and swelling (inflammation) of the skin. Contact dermatitis is a reaction to certain substances that touch the skin. Many different substances can cause contact dermatitis. There are two types of contact dermatitis:  Irritant contact dermatitis. This type is caused by something that irritates your skin, such as having dry hands from washing them too often with soap. This type does not require previous exposure to the substance for a reaction to occur. This is the most common type.  Allergic contact dermatitis. This type is caused by a substance that you are allergic to, such as poison ivy. This type occurs when you have been exposed to the substance (allergen) and develop a sensitivity to it. Dermatitis may develop soon after your first exposure to the allergen, or it may not develop until the next time you are exposed and every time thereafter. What are the causes? Irritant contact dermatitis is most commonly caused by exposure to:  Makeup.  Soaps.  Detergents.  Bleaches.  Acids.  Metal salts, such as nickel. Allergic contact dermatitis is most commonly caused by exposure to:  Poisonous plants.  Chemicals.  Jewelry.  Latex.  Medicines.  Preservatives in products, such as clothing. What increases the risk? You are more likely to develop this condition if you have:  A job that exposes you to irritants or allergens.  Certain medical conditions, such as asthma or eczema. What are the signs or symptoms? Symptoms of this condition may occur on your body anywhere the irritant has touched you or is touched by you.  Symptoms include: ? Dryness or flaking. ? Redness. ? Cracks. ? Itching. ? Pain or a burning feeling. ? Blisters. ? Drainage of small amounts of blood or clear fluid from skin cracks. With allergic contact dermatitis, there may also be swelling in areas such as the eyelids, mouth, or genitals. How is this  diagnosed? This condition is diagnosed with a medical history and physical exam.  A patch skin test may be performed to help determine the cause.  If the condition is related to your job, you may need to see an occupational medicine specialist. How is this treated? This condition is treated by checking for the cause of the reaction and protecting your skin from further contact. Treatment may also include:  Steroid creams or ointments. Oral steroid medicines may be needed in more severe cases.  Antibiotic medicines or antibacterial ointments, if a skin infection is present.  Antihistamine lotion or an antihistamine taken by mouth to ease itching.  A bandage (dressing). Follow these instructions at home: Skin care  Moisturize your skin as needed.  Apply cool compresses to the affected areas.  Try applying baking soda paste to your skin. Stir water into baking soda until it reaches a paste-like consistency.  Do not scratch your skin, and avoid friction to the affected area.  Avoid the use of soaps, perfumes, and dyes. Medicines  Take or apply over-the-counter and prescription medicines only as told by your health care provider.  If you were prescribed an antibiotic medicine, take or apply the antibiotic as told by your health care provider. Do not stop using the antibiotic even if your condition improves. Bathing  Try taking a bath with: ? Epsom salts. Follow the instructions on the packaging. You can get these at your local pharmacy or grocery store. ? Baking soda. Pour a small amount into the bath as directed by your health care provider. ? Colloidal oatmeal. Follow the instructions on the   packaging. You can get this at your local pharmacy or grocery store.  Bathe less frequently, such as every other day.  Bathe in lukewarm water. Avoid using hot water. Bandage care  If you were given a bandage (dressing), change it as told by your health care provider.  Wash your hands  with soap and water before and after you change your dressing. If soap and water are not available, use hand sanitizer. General instructions  Avoid the substance that caused your reaction. If you do not know what caused it, keep a journal to try to track what caused it. Write down: ? What you eat. ? What cosmetic products you use. ? What you drink. ? What you wear in the affected area. This includes jewelry.  Check the affected areas every day for signs of infection. Check for: ? More redness, swelling, or pain. ? More fluid or blood. ? Warmth. ? Pus or a bad smell.  Keep all follow-up visits as told by your health care provider. This is important. Contact a health care provider if:  Your condition does not improve with treatment.  Your condition gets worse.  You have signs of infection such as swelling, tenderness, redness, soreness, or warmth in the affected area.  You have a fever.  You have new symptoms. Get help right away if:  You have a severe headache, neck pain, or neck stiffness.  You vomit.  You feel very sleepy.  You notice red streaks coming from the affected area.  Your bone or joint underneath the affected area becomes painful after the skin has healed.  The affected area turns darker.  You have difficulty breathing. Summary  Dermatitis is redness, soreness, and swelling (inflammation) of the skin. Contact dermatitis is a reaction to certain substances that touch the skin.  Symptoms of this condition may occur on your body anywhere the irritant has touched you or is touched by you.  This condition is treated by figuring out what caused the reaction and protecting your skin from further contact. Treatment may also include medicines and skin care.  Avoid the substance that caused your reaction. If you do not know what caused it, keep a journal to try to track what caused it.  Contact a health care provider if your condition gets worse or you have signs  of infection such as swelling, tenderness, redness, soreness, or warmth in the affected area. This information is not intended to replace advice given to you by your health care provider. Make sure you discuss any questions you have with your health care provider. Document Revised: 08/08/2018 Document Reviewed: 11/01/2017 Elsevier Patient Education  2020 [employer]  

## 2019-12-16 ENCOUNTER — Encounter: Payer: Self-pay | Admitting: Family

## 2020-02-02 ENCOUNTER — Encounter: Payer: Self-pay | Admitting: Family

## 2020-02-03 ENCOUNTER — Encounter (INDEPENDENT_AMBULATORY_CARE_PROVIDER_SITE_OTHER): Payer: Self-pay

## 2020-02-07 ENCOUNTER — Ambulatory Visit (INDEPENDENT_AMBULATORY_CARE_PROVIDER_SITE_OTHER): Payer: BC Managed Care – PPO | Admitting: Family

## 2020-02-07 ENCOUNTER — Encounter: Payer: Self-pay | Admitting: Family

## 2020-02-07 ENCOUNTER — Other Ambulatory Visit: Payer: Self-pay

## 2020-02-07 VITALS — BP 142/86 | HR 88 | Temp 98.5°F | Ht 63.0 in | Wt 168.0 lb

## 2020-02-07 DIAGNOSIS — E7800 Pure hypercholesterolemia, unspecified: Secondary | ICD-10-CM

## 2020-02-07 DIAGNOSIS — R1031 Right lower quadrant pain: Secondary | ICD-10-CM

## 2020-02-07 DIAGNOSIS — E118 Type 2 diabetes mellitus with unspecified complications: Secondary | ICD-10-CM | POA: Diagnosis not present

## 2020-02-07 DIAGNOSIS — Z23 Encounter for immunization: Secondary | ICD-10-CM

## 2020-02-07 DIAGNOSIS — R194 Change in bowel habit: Secondary | ICD-10-CM | POA: Diagnosis not present

## 2020-02-07 LAB — LIPID PANEL
Cholesterol: 243 mg/dL — ABNORMAL HIGH (ref 0–200)
HDL: 37.2 mg/dL — ABNORMAL LOW (ref >39.00)
LDL Cholesterol: 183 mg/dL — ABNORMAL HIGH (ref 0–99)
NonHDL: 205.52
Total CHOL/HDL Ratio: 7
Triglycerides: 113 mg/dL (ref 0.0–149.0)
VLDL: 22.6 mg/dL (ref 0.0–40.0)

## 2020-02-07 LAB — CBC WITH DIFFERENTIAL/PLATELET
Basophils Absolute: 0 10*3/uL (ref 0.0–0.1)
Basophils Relative: 0.4 % (ref 0.0–3.0)
Eosinophils Absolute: 0.1 10*3/uL (ref 0.0–0.7)
Eosinophils Relative: 0.9 % (ref 0.0–5.0)
HCT: 40.1 % (ref 36.0–46.0)
Hemoglobin: 13.5 g/dL (ref 12.0–15.0)
Lymphocytes Relative: 44.8 % (ref 12.0–46.0)
Lymphs Abs: 4.3 10*3/uL — ABNORMAL HIGH (ref 0.7–4.0)
MCHC: 33.8 g/dL (ref 30.0–36.0)
MCV: 82.7 fl (ref 78.0–100.0)
Monocytes Absolute: 0.6 10*3/uL (ref 0.1–1.0)
Monocytes Relative: 6.4 % (ref 3.0–12.0)
Neutro Abs: 4.5 10*3/uL (ref 1.4–7.7)
Neutrophils Relative %: 47.5 % (ref 43.0–77.0)
Platelets: 250 10*3/uL (ref 150.0–400.0)
RBC: 4.86 Mil/uL (ref 3.87–5.11)
RDW: 14.3 % (ref 11.5–15.5)
WBC: 9.6 10*3/uL (ref 4.0–10.5)

## 2020-02-07 LAB — HEMOGLOBIN A1C: Hgb A1c MFr Bld: 7.7 % — ABNORMAL HIGH (ref 4.6–6.5)

## 2020-02-07 LAB — COMPREHENSIVE METABOLIC PANEL
ALT: 14 U/L (ref 0–35)
AST: 14 U/L (ref 0–37)
Albumin: 4.4 g/dL (ref 3.5–5.2)
Alkaline Phosphatase: 119 U/L — ABNORMAL HIGH (ref 39–117)
BUN: 21 mg/dL (ref 6–23)
CO2: 27 mEq/L (ref 19–32)
Calcium: 9.3 mg/dL (ref 8.4–10.5)
Chloride: 106 mEq/L (ref 96–112)
Creatinine, Ser: 1.16 mg/dL (ref 0.40–1.20)
GFR: 51.49 mL/min — ABNORMAL LOW (ref >60.00)
Glucose, Bld: 105 mg/dL — ABNORMAL HIGH (ref 70–99)
Potassium: 3.9 mEq/L (ref 3.5–5.1)
Sodium: 139 mEq/L (ref 135–145)
Total Bilirubin: 0.5 mg/dL (ref 0.2–1.2)
Total Protein: 7.7 g/dL (ref 6.0–8.3)

## 2020-02-07 MED ORDER — ATORVASTATIN CALCIUM 20 MG PO TABS: 20.0000 mg | ORAL_TABLET | Freq: Every day | ORAL | 3 refills | Status: DC

## 2020-02-07 NOTE — Patient Instructions (Signed)
Please call [employer] to check on when they will cover [employer] for you;

## 2020-02-07 NOTE — Progress Notes (Signed)
Julie Dawson is a 59 y.o. female with the following history as recorded in EpicCare:  Patient Active Problem List   Diagnosis Date Noted  . Tinea manus 12/03/2019  . Allergic contact dermatitis due to metals 12/03/2019  . Temporal pain 04/29/2019  . Numbness of fingers of both hands 04/29/2019  . Chronic bilateral low back pain without sciatica 02/04/2018  . Cigarette nicotine dependence with nicotine-induced disorder 07/13/2017  . Type 2 diabetes mellitus with complication, without long-term current use of insulin ([place]) 08/23/2016  . 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 [place] SKIN 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 06/08/2010  . INSOMNIA 12/31/2009  . HERPES LABIALIS 01/21/2009  . ONYCHOMYCOSIS 07/29/2008  . HYPERCHOLESTEROLEMIA 07/29/2008  . DENTAL CARIES 04/21/2008  . OBESITY 03/13/2008  . MENOPAUSAL SYNDROME 03/13/2008  . HEADACHE 03/13/2008  . DRUG ABUSE, HX OF 03/13/2008    Current Outpatient Medications  Medication Sig Dispense Refill  . atorvastatin (LIPITOR) 20 MG tablet Take 1 tablet (20 mg total) by mouth daily at 6 PM. 90 tablet 3  . ciclopirox (LOPROX) 0.77 % cream Apply topically 2 (two) times daily. 90 g 1  . Cinnamon 500 MG capsule Take 500 mg by mouth daily.    . Dulaglutide (TRULICITY) 3.55 DD/2.2GU SOPN Inject weekly as directed 4 pen 0  . fluocinonide cream (LIDEX) 5.42 % Apply 1 application topically 2 (two) times daily. 60 g 1   No current facility-administered medications for this visit.    Allergies: Ace inhibitors, Jardiance [empagliflozin], Lisinopril, and Metformin and related  Past Medical History:  Diagnosis Date  . Allergy    spring time only   . Constipation    doesnt use otc medicines  . Diabetes mellitus without complication ([place])   . History of positive PPD    cannot do the tine test,  has to do CXR  . Hyperlipidemia     Past Surgical History:  Procedure Laterality Date  . cervical adenitis node remova    . ECTOPIC PREGNANCY  SURGERY    . MOUTH SURGERY     for wisdom teeth removal    Family History  Problem Relation Age of Onset  . Diabetes Mother   . Hypertension Mother   . COPD Mother   . Stroke Father   . Diabetes Father   . Hypertension Father   . Diabetes Sister   . Drug abuse Brother   . Alcohol abuse Sister   . Colon cancer Neg Hx   . Colon polyps Neg Hx   . Esophageal cancer Neg Hx   . Rectal cancer Neg Hx   . Stomach cancer Neg Hx     Social History   Tobacco Use  . Smoking status: Current Every Day Smoker    Packs/day: 1.00    Types: Cigarettes  . Smokeless tobacco: Never Used  Substance Use Topics  . Alcohol use: No    Subjective:   Patient presents with concerns for changes in her bowel movements; symptoms x 1 month; feels that the appearance of her stools have changed; has been having increased right sided pelvic pain; does still have right ovary; left was removed due to ectopic pregnancy;    Objective:  Vitals:   02/07/20 0858  BP: (!) 142/86  Pulse: 88  Temp: 98.5 F (36.9 C)  TempSrc: Oral  SpO2: 99%  Weight: 168 lb (76.2 kg)  Height: _0  (1.6 m)    General: Well developed, well nourished, in no acute distress  Head:  Normocephalic and atraumatic  Lungs: Respirations unlabored; clear to auscultation bilaterally without wheeze, rales, rhonchi  CVS exam: normal rate and regular rhythm.  Abdomen: Soft; nontender; nondistended; normoactive bowel sounds; no masses or hepatosplenomegaly  Musculoskeletal: No deformities; no active joint inflammation  Extremities: No edema, cyanosis, clubbing  Vessels: Symmetric bilaterally  Neurologic: Alert and oriented; speech intact; face symmetrical; moves all extremities well; CNII-XII intact without focal deficit   Assessment:  1. RLQ abdominal pain   2. HYPERCHOLESTEROLEMIA   3. Bowel habit changes   4. Type 2 diabetes mellitus with complication, without long-term current use of insulin ([place])     Plan:  1. Update labs;  update abd/ pelvic CT; follow up to be determined; 2. Refill updated; 3. Cologuard done in 12/2018- may need colonoscopy 4. Check Hgba1c today;  This visit occurred during the SARS-CoV-2 public health emergency.  Safety protocols were in place, including screening questions prior to the visit, additional usage of staff PPE, and extensive cleaning of exam room while observing appropriate contact time as indicated for disinfecting solutions.      No follow-ups on file.  Orders Placed This Encounter  Procedures  . CT Abdomen Pelvis W Contrast    Standing Status:   Future    Standing Expiration Date:   02/06/2021    Order Specific Question:   If indicated for the ordered procedure, I authorize the administration of contrast media per Radiology protocol    Answer:   Yes    Order Specific Question:   Is patient pregnant?    Answer:   No    Order Specific Question:   Preferred imaging location?    Answer:   [id number]    Order Specific Question:   Is Oral Contrast requested for this exam?    Answer:   Yes, Per Radiology protocol  . Flu Vaccine QUAD 6+ mos PF IM (Fluarix Quad PF)  . Comp Met (CMET)    Standing Status:   Future    Number of Occurrences:   1    Standing Expiration Date:   02/06/2021  . Lipid panel    Standing Status:   Future    Number of Occurrences:   1    Standing Expiration Date:   02/06/2021  . Hemoglobin A1c    Standing Status:   Future    Number of Occurrences:   1    Standing Expiration Date:   02/06/2021  . CBC with Differential/Platelet    Standing Status:   Future    Number of Occurrences:   1    Standing Expiration Date:   02/06/2021    Requested Prescriptions   Signed Prescriptions Disp Refills  . atorvastatin (LIPITOR) 20 MG tablet 90 tablet 3    Sig: Take 1 tablet (20 mg total) by mouth daily at 6 PM.

## 2020-02-09 ENCOUNTER — Encounter: Payer: Self-pay | Admitting: Family

## 2020-02-10 ENCOUNTER — Encounter: Payer: Self-pay | Admitting: Family

## 2020-02-25 ENCOUNTER — Ambulatory Visit
Admission: RE | Admit: 2020-02-25 | Discharge: 2020-02-25 | Disposition: A | Discharge status: Home / Self Care | Payer: BC Managed Care – PPO | Source: Ambulatory Visit | Attending: Family | Admitting: Family

## 2020-02-25 DIAGNOSIS — R194 Change in bowel habit: Secondary | ICD-10-CM

## 2020-02-25 DIAGNOSIS — N281 Cyst of kidney, acquired: Secondary | ICD-10-CM | POA: Diagnosis not present

## 2020-02-25 DIAGNOSIS — R1031 Right lower quadrant pain: Secondary | ICD-10-CM

## 2020-02-25 MED ORDER — IOPAMIDOL (ISOVUE-300) INJECTION 61%
100.0000 mL | Freq: Once | INTRAVENOUS | Status: AC | PRN
  Administered 2020-02-25: 100 mL via INTRAVENOUS

## 2020-02-26 ENCOUNTER — Encounter: Payer: Self-pay | Admitting: Family

## 2020-02-27 ENCOUNTER — Encounter: Payer: Self-pay | Admitting: Family

## 2020-02-28 ENCOUNTER — Other Ambulatory Visit: Payer: Self-pay | Admitting: Family

## 2020-02-28 DIAGNOSIS — K5909 Other constipation: Secondary | ICD-10-CM

## 2020-05-05 ENCOUNTER — Ambulatory Visit (INDEPENDENT_AMBULATORY_CARE_PROVIDER_SITE_OTHER): Payer: BC Managed Care – PPO | Admitting: Gastroenterology

## 2020-05-05 ENCOUNTER — Encounter: Payer: Self-pay | Admitting: Gastroenterology

## 2020-05-05 VITALS — BP 120/70 | HR 94 | Ht 63.0 in | Wt 169.0 lb

## 2020-05-05 DIAGNOSIS — K5909 Other constipation: Secondary | ICD-10-CM | POA: Diagnosis not present

## 2020-05-05 NOTE — Patient Instructions (Addendum)
If you are age 60 or older, your body mass index should be between 23-30. Your Body mass index is 29.94 kg/m. If this is out of the aforementioned range listed, please consider follow up with your Primary Care Provider.  If you are age 75 or younger, your body mass index should be between 19-25. Your Body mass index is 29.94 kg/m. If this is out of the aformentioned range listed, please consider follow up with your Primary Care Provider.   We have given you samples of Linzess 145 mcg to try. Please call back in 10 days to let us know how you are doing ask for Annye Rusk.   Thank you for choosing me and [facility].  Doug Sou, PA-C

## 2020-05-05 NOTE — Progress Notes (Signed)
05/05/2020 Julie Dawson [id number] 10-13-1960   HISTORY OF PRESENT ILLNESS: This is a pleasant 60 year old female who is new to our office she has been referred here by Olive Bass, FNP, for evaluation regarding chronic constipation.  She tells me that she has tended toward constipation for quite some time, but it has really worsened over the past year.  She says that she tried MiraLAX daily for about 12 days in a row and did not have any results from that.  She has been drinking a couple different types of senna-containing teas usually about twice a week and those really seem to help her.  She had a CT scan of the abdomen and pelvis with contrast on October 27 that showed moderate volume stool throughout the colon, no obstructing lesions or bowel inflammation identified.  TSH was normal in December 2020.  She has labs performed regularly by her PCP and is due to be seen there next month.  She has not had a colonoscopy, but did have a Cologuard performed in August 2020 that was negative   Past Medical History:  Diagnosis Date  . Allergy    spring time only   . Constipation    doesnt use otc medicines  . Diabetes mellitus without complication (HCC)   . History of positive PPD    cannot do the tine test,  has to do CXR  . Hyperlipidemia    Past Surgical History:  Procedure Laterality Date  . cervical adenitis node remova    . ECTOPIC PREGNANCY SURGERY    . MOUTH SURGERY     for wisdom teeth removal    reports that she has been smoking cigarettes. She has been smoking about 1.00 pack per day. She has never used smokeless tobacco. She reports that she does not drink alcohol and does not use drugs. family history includes Alcohol abuse in her sister; COPD in her mother; Diabetes in her father, mother, and sister; Drug abuse in her brother; Hypertension in her father and mother; Stroke in her father. Allergies  Allergen Reactions  . Ace Inhibitors Swelling  . Jardiance  [Empagliflozin]     Vaginal irritation  . Lisinopril Swelling  . Metformin And Related Nausea Only      Outpatient Encounter Medications as of 05/05/2020  Medication Sig  . atorvastatin (LIPITOR) 20 MG tablet Take 1 tablet (20 mg total) by mouth daily at 6 PM.  . Cinnamon 500 MG capsule Take 500 mg by mouth daily.  . Dulaglutide (TRULICITY) 0.75 MG/0.5ML SOPN Inject weekly as directed  . [DISCONTINUED] ciclopirox (LOPROX) 0.77 % cream Apply topically 2 (two) times daily.  . [DISCONTINUED] fluocinonide cream (LIDEX) 0.05 % Apply 1 application topically 2 (two) times daily.   No facility-administered encounter medications on file as of 05/05/2020.     REVIEW OF SYSTEMS  : All other systems reviewed and negative except where noted in the History of Present Illness.   PHYSICAL EXAM: BP 120/70   Pulse 94   Ht 5\' 3"  (1.6 m)   Wt 169 lb (76.7 kg)   BMI 29.94 kg/m  General: Well developed AA female in no acute distress Head: Normocephalic and atraumatic Eyes:  Sclerae anicteric, conjunctiva pink. Ears: Normal auditory acuity Lungs: Clear throughout to auscultation; no W/R/R. Heart: Regular rate and rhythm; n oM/R/G. Abdomen: Soft, non-distended.  BS present.  Non-tender. Musculoskeletal: Symmetrical with no gross deformities  Skin: No lesions on visible extremities Extremities: No edema  Neurological: Alert  oriented x 4, grossly non-focal Psychological:  Alert and cooperative. Normal mood and affect  ASSESSMENT AND PLAN: *Chronic constipation: Worsening over the past year.  No improvement with MiraLAX once daily, but senna teas definitely help her.  She is going to try Linzess 145 mcg daily.  She was given samples.  I asked her to call the office back in about 10 days with an update at which time we can determine which dosing she will use whether it be 145 or needs to be adjusted up or down.  Then prescription can be sent.   CC:  Olive Bass,*

## 2020-05-05 NOTE — Progress Notes (Signed)
Attending Physician's Attestation   I have reviewed the chart.   I agree with the Advanced Practitioner's note, impression, and recommendations with any updates as below. Agree with titration of Linzess or Trulance in the coming months.  If things persist then would recommend a diagnostic colonoscopy, but because she has had a recent Cologuard then reasonable to hold on that diagnostic colonoscopy for now.   Corliss Parish, MD [facility] Advanced Endoscopy Office # [phone]

## 2020-05-11 DIAGNOSIS — Z1152 Encounter for screening for COVID-19: Secondary | ICD-10-CM | POA: Diagnosis not present

## 2020-05-15 ENCOUNTER — Telehealth: Payer: Self-pay | Admitting: Gastroenterology

## 2020-05-15 MED ORDER — LINACLOTIDE 290 MCG PO CAPS: 290.0000 ug | ORAL_CAPSULE | Freq: Every day | ORAL | 6 refills | Status: DC

## 2020-05-15 NOTE — Telephone Encounter (Signed)
The pt has been taking linzess 145 mcg and still has to strain.  She only had 3 BM's in the past 10 days. Per your last note the pt can titrate up/down.  Please advise

## 2020-05-15 NOTE — Telephone Encounter (Signed)
Can increase to 290 mcg daily.  Please send a prescription.  I would like her to call us back in approximately another 2 weeks and let us know how she is doing.

## 2020-05-15 NOTE — Telephone Encounter (Signed)
Tried to reach pt by phone and no answer and voice mail full.  I will send a message to the pt via My Chart. Linzess 290 mcg has been sent to the pharmacy.

## 2020-05-15 NOTE — Telephone Encounter (Signed)
Inbound call from patient requesting a call back from the nurse please.  States she feels Linzess dosage needs to be increased.

## 2020-06-29 ENCOUNTER — Encounter: Payer: Self-pay | Admitting: Family

## 2020-06-29 ENCOUNTER — Other Ambulatory Visit: Payer: Self-pay

## 2020-06-29 ENCOUNTER — Ambulatory Visit (INDEPENDENT_AMBULATORY_CARE_PROVIDER_SITE_OTHER): Payer: BC Managed Care – PPO | Admitting: Family

## 2020-06-29 VITALS — BP 138/80 | HR 99 | Temp 98.0°F | Ht 63.0 in | Wt 167.4 lb

## 2020-06-29 DIAGNOSIS — M21612 Bunion of left foot: Secondary | ICD-10-CM

## 2020-06-29 DIAGNOSIS — Z23 Encounter for immunization: Secondary | ICD-10-CM

## 2020-06-29 DIAGNOSIS — Z124 Encounter for screening for malignant neoplasm of cervix: Secondary | ICD-10-CM

## 2020-06-29 DIAGNOSIS — E118 Type 2 diabetes mellitus with unspecified complications: Secondary | ICD-10-CM

## 2020-06-29 LAB — CBC WITH DIFFERENTIAL/PLATELET
Basophils Absolute: 0.1 10*3/uL (ref 0.0–0.1)
Basophils Relative: 0.5 % (ref 0.0–3.0)
Eosinophils Absolute: 0.1 10*3/uL (ref 0.0–0.7)
Eosinophils Relative: 0.9 % (ref 0.0–5.0)
HCT: 38.3 % (ref 36.0–46.0)
Hemoglobin: 13.1 g/dL (ref 12.0–15.0)
Lymphocytes Relative: 41.3 % (ref 12.0–46.0)
Lymphs Abs: 4.3 10*3/uL — ABNORMAL HIGH (ref 0.7–4.0)
MCHC: 34.1 g/dL (ref 30.0–36.0)
MCV: 80.7 fl (ref 78.0–100.0)
Monocytes Absolute: 0.7 10*3/uL (ref 0.1–1.0)
Monocytes Relative: 6.3 % (ref 3.0–12.0)
Neutro Abs: 5.3 10*3/uL (ref 1.4–7.7)
Neutrophils Relative %: 51 % (ref 43.0–77.0)
Platelets: 219 10*3/uL (ref 150.0–400.0)
RBC: 4.75 Mil/uL (ref 3.87–5.11)
RDW: 13.6 % (ref 11.5–15.5)
WBC: 10.4 10*3/uL (ref 4.0–10.5)

## 2020-06-29 LAB — MICROALBUMIN / CREATININE URINE RATIO
Creatinine,U: 128.4 mg/dL
Microalb Creat Ratio: 3.6 mg/g (ref 0.0–30.0)
Microalb, Ur: 4.6 mg/dL — ABNORMAL HIGH (ref 0.0–1.9)

## 2020-06-29 LAB — COMPREHENSIVE METABOLIC PANEL
ALT: 14 U/L (ref 0–35)
AST: 13 U/L (ref 0–37)
Albumin: 4.2 g/dL (ref 3.5–5.2)
Alkaline Phosphatase: 113 U/L (ref 39–117)
BUN: 17 mg/dL (ref 6–23)
CO2: 25 mEq/L (ref 19–32)
Calcium: 9 mg/dL (ref 8.4–10.5)
Chloride: 108 mEq/L (ref 96–112)
Creatinine, Ser: 1.08 mg/dL (ref 0.40–1.20)
GFR: 56.26 mL/min — ABNORMAL LOW (ref >60.00)
Glucose, Bld: 104 mg/dL — ABNORMAL HIGH (ref 70–99)
Potassium: 3.6 mEq/L (ref 3.5–5.1)
Sodium: 141 mEq/L (ref 135–145)
Total Bilirubin: 0.5 mg/dL (ref 0.2–1.2)
Total Protein: 7.2 g/dL (ref 6.0–8.3)

## 2020-06-29 LAB — HEMOGLOBIN A1C: Hgb A1c MFr Bld: 7.3 % — ABNORMAL HIGH (ref 4.6–6.5)

## 2020-06-29 MED ORDER — RYBELSUS 3 MG PO TABS: ORAL_TABLET | ORAL | 1 refills | Status: DC

## 2020-06-29 NOTE — Progress Notes (Signed)
Julie Dawson is a 60 y.o. female with the following history as recorded in EpicCare:  Patient Active Problem List   Diagnosis Date Noted  . Chronic constipation 05/05/2020  . Tinea manus 12/03/2019  . Allergic contact dermatitis due to metals 12/03/2019  . Temporal pain 04/29/2019  . Numbness of fingers of both hands 04/29/2019  . Chronic bilateral low back pain without sciatica 02/04/2018  . Cigarette nicotine dependence with nicotine-induced disorder 07/13/2017  . Type 2 diabetes mellitus with complication, without long-term current use of insulin ([place]) 08/23/2016  . 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 [place] SKIN 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 06/08/2010  . INSOMNIA 12/31/2009  . HERPES LABIALIS 01/21/2009  . ONYCHOMYCOSIS 07/29/2008  . HYPERCHOLESTEROLEMIA 07/29/2008  . DENTAL CARIES 04/21/2008  . OBESITY 03/13/2008  . MENOPAUSAL SYNDROME 03/13/2008  . HEADACHE 03/13/2008  . DRUG ABUSE, HX OF 03/13/2008    Current Outpatient Medications  Medication Sig Dispense Refill  . atorvastatin (LIPITOR) 20 MG tablet Take 1 tablet (20 mg total) by mouth daily at 6 PM. 90 tablet 3  . Cinnamon 500 MG capsule Take 500 mg by mouth daily.    . Dulaglutide (TRULICITY) 3.82 NK/5.3ZJ SOPN Inject weekly as directed 4 pen 0  . Semaglutide (RYBELSUS) 3 MG TABS Take 1 tablet daily 30 tablet 1  . linaclotide (LINZESS) 290 MCG CAPS capsule Take 1 capsule (290 mcg total) by mouth daily before breakfast. (Patient not taking: Reported on 06/29/2020) 30 capsule 6   No current facility-administered medications for this visit.    Allergies: Ace inhibitors, Jardiance [empagliflozin], Lisinopril, and Metformin and related  Past Medical History:  Diagnosis Date  . Allergy    spring time only   . Constipation    doesnt use otc medicines  . Diabetes mellitus without complication ([place])   . History of positive PPD    cannot do the tine test,  has to do CXR  . Hyperlipidemia     Past Surgical  History:  Procedure Laterality Date  . cervical adenitis node remova    . ECTOPIC PREGNANCY SURGERY    . MOUTH SURGERY     for wisdom teeth removal    Family History  Problem Relation Age of Onset  . Diabetes Mother   . Hypertension Mother   . COPD Mother   . Stroke Father   . Diabetes Father   . Hypertension Father   . Diabetes Sister   . Drug abuse Brother   . Alcohol abuse Sister   . Colon cancer Neg Hx   . Colon polyps Neg Hx   . Esophageal cancer Neg Hx   . Rectal cancer Neg Hx   . Stomach cancer Neg Hx     Social History   Tobacco Use  . Smoking status: Current Every Day Smoker    Packs/day: 1.00    Types: Cigarettes  . Smokeless tobacco: Never Used  Substance Use Topics  . 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 Also notes that her life insurance premium is high because she is on injectable medication.     Objective:  Vitals:   06/29/20 1031  BP: 138/80  Pulse: 99  Temp: 98 F (36.7 C)  TempSrc: Oral  SpO2: 96%  Weight: 167 lb 6.4 oz (75.9 kg)  Height: 5' 3"  (1.6 m)  General: Well developed, well nourished, in no acute distress  Skin : Warm and dry.  Head: Normocephalic and atraumatic  Lungs: Respirations unlabored; clear to auscultation bilaterally without wheeze, rales, rhonchi  CVS exam: normal rate and regular rhythm.  Neurologic: Alert and oriented; speech intact; face symmetrical; moves all extremities well; CNII-XII intact without focal deficit   Assessment:  1. Type 2 diabetes mellitus with complication, without long-term current use of insulin ([place])   2. Bunion of left foot   3. Cervical cancer screening   4. Need for vaccination against Streptococcus pneumoniae     Plan:  1. Update labs today; will try changing to Rybelsus- she will check with insurance to verify cost of medication; she will let me  know if she is going to change or stay on Trulicity; 2. Refer to podiatry; 3. Overdue to see her GYN- asks for new referral; 4. Prevnar given today;  This visit occurred during the SARS-CoV-2 public health emergency.  Safety protocols were in place, including screening questions prior to the visit, additional usage of staff PPE, and extensive cleaning of exam room while observing appropriate contact time as indicated for disinfecting solutions.      No follow-ups on file.  Orders Placed This Encounter  Procedures  . Pneumococcal conjugate vaccine 13-valent  . CBC with Differential/Platelet    Standing Status:   Future    Number of Occurrences:   1    Standing Expiration Date:   06/29/2021  . Comp Met (CMET)    Standing Status:   Future    Number of Occurrences:   1    Standing Expiration Date:   06/29/2021  . Hemoglobin A1c    Standing Status:   Future    Number of Occurrences:   1    Standing Expiration Date:   06/29/2021  . Urine Microalbumin w/creat. ratio    Standing Status:   Future    Number of Occurrences:   1    Standing Expiration Date:   06/29/2021  . Ambulatory referral to Podiatry    Referral Priority:   Routine    Referral Type:   Consultation    Referral Reason:   Specialty Services Required    Requested Specialty:   Podiatry    Number of Visits Requested:   1  . Ambulatory referral to Gynecology    Referral Priority:   Routine    Referral Type:   Consultation    Referral Reason:   Specialty Services Required    Requested Specialty:   Gynecology    Number of Visits Requested:   1    Requested Prescriptions   Signed Prescriptions Disp Refills  . Semaglutide (RYBELSUS) 3 MG TABS 30 tablet 1    Sig: Take 1 tablet daily

## 2020-06-30 ENCOUNTER — Encounter: Payer: Self-pay | Admitting: Family

## 2020-07-01 ENCOUNTER — Other Ambulatory Visit: Payer: Self-pay | Admitting: Family

## 2020-07-09 ENCOUNTER — Ambulatory Visit: Payer: BC Managed Care – PPO | Admitting: Podiatry

## 2020-07-16 ENCOUNTER — Ambulatory Visit: Payer: BC Managed Care – PPO | Admitting: Podiatry

## 2020-07-16 ENCOUNTER — Ambulatory Visit (INDEPENDENT_AMBULATORY_CARE_PROVIDER_SITE_OTHER): Payer: BC Managed Care – PPO

## 2020-07-16 ENCOUNTER — Encounter: Payer: Self-pay | Admitting: Podiatry

## 2020-07-16 ENCOUNTER — Other Ambulatory Visit: Payer: Self-pay

## 2020-07-16 DIAGNOSIS — M205X2 Other deformities of toe(s) (acquired), left foot: Secondary | ICD-10-CM | POA: Diagnosis not present

## 2020-07-16 DIAGNOSIS — M778 Other enthesopathies, not elsewhere classified: Secondary | ICD-10-CM

## 2020-07-16 DIAGNOSIS — M2012 Hallux valgus (acquired), left foot: Secondary | ICD-10-CM | POA: Diagnosis not present

## 2020-07-16 DIAGNOSIS — L600 Ingrowing nail: Secondary | ICD-10-CM

## 2020-07-16 MED ORDER — TRIAMCINOLONE ACETONIDE 40 MG/ML IJ SUSP
20.0000 mg | Freq: Once | INTRAMUSCULAR | Status: AC
Start: 2020-07-16 — End: 2020-07-16
  Administered 2020-07-16: 20 mg

## 2020-07-16 MED ORDER — NEOMYCIN-POLYMYXIN-HC 1 % OT SOLN: OTIC | 1 refills | Status: DC

## 2020-07-16 NOTE — Progress Notes (Signed)
Subjective:  Patient ID: Julie Dawson, female    DOB: [birth year],  MRN: [id number] HPI Chief Complaint  Patient presents with  . Foot Pain    1st MPJ left - bunion deformity x years, starting to ache more, darkened, rubbing shoe  . Toe Pain    Hallux right - medial border, tender, ingrown, usually gets trimmed during pedicures, itches  . New Patient (Initial Visit)    60 y.o. female presents with the above complaint.   ROS: Denies fever chills nausea vomiting muscle aches pains calf pain back pain chest pain shortness of breath.  Past Medical History:  Diagnosis Date  . Allergy    spring time only   . Constipation    doesnt use otc medicines  . Diabetes mellitus without complication (HCC)   . History of positive PPD    cannot do the tine test,  has to do CXR  . Hyperlipidemia    Past Surgical History:  Procedure Laterality Date  . cervical adenitis node remova    . ECTOPIC PREGNANCY SURGERY    . MOUTH SURGERY     for wisdom teeth removal    Current Outpatient Medications:  .  NEOMYCIN-POLYMYXIN-HYDROCORTISONE (CORTISPORIN) 1 % SOLN OTIC solution, Apply 1-2 drops to toe BID after soaking, Disp: 10 mL, Rfl: 1 .  atorvastatin (LIPITOR) 20 MG tablet, Take 1 tablet (20 mg total) by mouth daily at 6 PM., Disp: 90 tablet, Rfl: 3 .  Cinnamon 500 MG capsule, Take 500 mg by mouth daily., Disp: , Rfl:  .  Dulaglutide (TRULICITY) 0.75 MG/0.5ML SOPN, Inject weekly as directed, Disp: 4 pen, Rfl: 0 .  linaclotide (LINZESS) 290 MCG CAPS capsule, Take 1 capsule (290 mcg total) by mouth daily before breakfast. (Patient not taking: Reported on 06/29/2020), Disp: 30 capsule, Rfl: 6  Allergies  Allergen Reactions  . Ace Inhibitors Swelling  . Jardiance [Empagliflozin]     Vaginal irritation  . Lisinopril Swelling  . Metformin And Related Nausea Only   Review of Systems Objective:  There were no vitals filed for this visit.  General: Well developed, nourished, in no acute  distress, alert and oriented x3   Dermatological: Skin is warm, dry and supple bilateral. Nails x 10 are well maintained; remaining integument appears unremarkable at this time. There are no open sores, no preulcerative lesions, no rash or signs of infection present.  Sharply abraded nail margin tibial border of the hallux right mild erythema no purulence no malodor.  Vascular: Dorsalis Pedis artery and Posterior Tibial artery pedal pulses are 2/4 bilateral with immedate capillary fill time. Pedal hair growth present. No varicosities and no lower extremity edema present bilateral.   Neruologic: Grossly intact via light touch bilateral. Vibratory intact via tuning fork bilateral. Protective threshold with Semmes Wienstein monofilament intact to all pedal sites bilateral. Patellar and Achilles deep tendon reflexes 2+ bilateral. No Babinski or clonus noted bilateral.   Musculoskeletal: No gross boney pedal deformities bilateral. No pain, crepitus, or limitation noted with foot and ankle range of motion bilateral. Muscular strength 5/5 in all groups tested bilateral.  Painful limited range of motion first metatarsophalangeal joint of the left foot.  Hypertrophic medial condyle is present with postinflammatory hyperpigmentation.  Gait: Unassisted, Nonantalgic.    Radiographs:  Joint space narrowing subchondral sclerosis eburnation of the first metatarsophalangeal joint with early dislocation hypertrophic medial condyles present.  Osteoarthritis.  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  Ingrown  toenail tibial border hallux right  Plan: Discussed etiology pathology and surgical therapies this point time performed chemical matricectomy to the tibial border of the hallux right.  Discussed possible need for joint replacement first metatarsophalangeal joint and injected the painful first metatarsophalangeal joint today  with dexamethasone local anesthetic.  She was given both oral and home-going structure for care and soaking of the matrixectomy site as well as a prescription for Cortisporin Otic to be applied twice daily after soaking.  Follow-up with her in 2 weeks     Addalynne Golding T. [place], [place]

## 2020-07-16 NOTE — Patient Instructions (Signed)

## 2020-07-23 ENCOUNTER — Encounter: Payer: BC Managed Care – PPO | Admitting: Nurse Practitioner

## 2020-07-31 ENCOUNTER — Encounter: Payer: Self-pay | Admitting: Family

## 2020-08-03 ENCOUNTER — Telehealth: Payer: Self-pay | Admitting: Podiatry

## 2020-08-03 NOTE — Telephone Encounter (Signed)
Patient states she can not continue just wrapping her tow with bandaid. Has requested an urgent appointment to check her nail.

## 2020-08-04 ENCOUNTER — Ambulatory Visit (INDEPENDENT_AMBULATORY_CARE_PROVIDER_SITE_OTHER): Payer: BC Managed Care – PPO | Admitting: Podiatry

## 2020-08-04 ENCOUNTER — Other Ambulatory Visit: Payer: Self-pay

## 2020-08-04 ENCOUNTER — Ambulatory Visit: Payer: BC Managed Care – PPO | Admitting: Podiatry

## 2020-08-04 DIAGNOSIS — Z9889 Other specified postprocedural states: Secondary | ICD-10-CM

## 2020-08-04 DIAGNOSIS — L600 Ingrowing nail: Secondary | ICD-10-CM

## 2020-08-04 NOTE — Telephone Encounter (Signed)
I have received the patient assist application and completed what I could. Paperwork placed on provider desk for her part and sign.

## 2020-08-04 NOTE — Progress Notes (Signed)
She presents today for follow-up of her matrixectomy hallux right states that she is tired of wearing a Band-Aid and like to know if she can quit wearing it.  She is also concerned about her bunion on her left foot.  Objective: Vital signs are stable alert oriented x3.  There is no erythema edema cellulitis drainage or odor to the hallux right.  She has mild hypertrophic medial condyle with some lateral deviation of the first metatarsal phalangeal joint hallux.  Assessment: Well-healing surgical foot hallux valgus left.  Plan: Continue Epson salt warm water soaks every other day do not cover leave open and I will follow-up with her in the summertime for possible [place] or [place] left she will need x-rays at that time.

## 2020-08-05 NOTE — Telephone Encounter (Signed)
I have left VM stating that the forms are ready for pick up at the front desk. I have sent pt my-chart message and placed a copy in the scan bin. I will hold on to a copy as well.

## 2020-08-07 ENCOUNTER — Telehealth: Payer: Self-pay | Admitting: Family

## 2020-08-07 NOTE — Telephone Encounter (Signed)
I have called pt and informed her that she should call her dentist to get another antibiotic that would work better for her.

## 2020-08-07 NOTE — Telephone Encounter (Signed)
   Patient calling to report after taking Dulaglutide (TRULICITY) 0.75 MG/0.5ML SOPN this morning she began to feel "horrible"  Patient  has nausea  Patient also states she recently started taking Penicillin prescribed by dentist.  Patient seeking advice for nausea

## 2020-08-09 ENCOUNTER — Other Ambulatory Visit: Payer: Self-pay

## 2020-08-09 ENCOUNTER — Encounter (HOSPITAL_COMMUNITY): Payer: Self-pay | Admitting: Emergency Medicine

## 2020-08-09 ENCOUNTER — Emergency Department (HOSPITAL_COMMUNITY)
Admission: EM | Admit: 2020-08-09 | Discharge: 2020-08-09 | Disposition: A | Discharge status: Home / Self Care | Payer: BC Managed Care – PPO | Attending: Emergency Medicine | Admitting: Emergency Medicine

## 2020-08-09 ENCOUNTER — Emergency Department (HOSPITAL_COMMUNITY): Payer: BC Managed Care – PPO

## 2020-08-09 DIAGNOSIS — E119 Type 2 diabetes mellitus without complications: Secondary | ICD-10-CM | POA: Insufficient documentation

## 2020-08-09 DIAGNOSIS — R350 Frequency of micturition: Secondary | ICD-10-CM | POA: Diagnosis not present

## 2020-08-09 DIAGNOSIS — F17210 Nicotine dependence, cigarettes, uncomplicated: Secondary | ICD-10-CM | POA: Insufficient documentation

## 2020-08-09 DIAGNOSIS — R112 Nausea with vomiting, unspecified: Secondary | ICD-10-CM | POA: Diagnosis not present

## 2020-08-09 DIAGNOSIS — R1032 Left lower quadrant pain: Secondary | ICD-10-CM | POA: Insufficient documentation

## 2020-08-09 DIAGNOSIS — R109 Unspecified abdominal pain: Secondary | ICD-10-CM | POA: Diagnosis not present

## 2020-08-09 LAB — URINALYSIS, ROUTINE W REFLEX MICROSCOPIC
Bacteria, UA: NONE SEEN
Bilirubin Urine: NEGATIVE
Glucose, UA: NEGATIVE mg/dL
Hgb urine dipstick: NEGATIVE
Ketones, ur: NEGATIVE mg/dL
Nitrite: NEGATIVE
Protein, ur: NEGATIVE mg/dL
Specific Gravity, Urine: 1.014 (ref 1.005–1.030)
pH: 5 (ref 5.0–8.0)

## 2020-08-09 LAB — CBC
HCT: 41.3 % (ref 36.0–46.0)
Hemoglobin: 13.5 g/dL (ref 12.0–15.0)
MCH: 27.2 pg (ref 26.0–34.0)
MCHC: 32.7 g/dL (ref 30.0–36.0)
MCV: 83.3 fL (ref 80.0–100.0)
Platelets: 216 10*3/uL (ref 150–400)
RBC: 4.96 MIL/uL (ref 3.87–5.11)
RDW: 13.1 % (ref 11.5–15.5)
WBC: 8.6 10*3/uL (ref 4.0–10.5)
nRBC: 0 % (ref 0.0–0.2)

## 2020-08-09 LAB — COMPREHENSIVE METABOLIC PANEL
ALT: 18 U/L (ref 0–44)
AST: 16 U/L (ref 15–41)
Albumin: 3.8 g/dL (ref 3.5–5.0)
Alkaline Phosphatase: 96 U/L (ref 38–126)
Anion gap: 6 (ref 5–15)
BUN: 15 mg/dL (ref 6–20)
CO2: 24 mmol/L (ref 22–32)
Calcium: 8.6 mg/dL — ABNORMAL LOW (ref 8.9–10.3)
Chloride: 107 mmol/L (ref 98–111)
Creatinine, Ser: 1.19 mg/dL — ABNORMAL HIGH (ref 0.44–1.00)
GFR, Estimated: 53 mL/min — ABNORMAL LOW (ref >60)
Glucose, Bld: 100 mg/dL — ABNORMAL HIGH (ref 70–99)
Potassium: 3.9 mmol/L (ref 3.5–5.1)
Sodium: 137 mmol/L (ref 135–145)
Total Bilirubin: 0.9 mg/dL (ref 0.3–1.2)
Total Protein: 7 g/dL (ref 6.5–8.1)

## 2020-08-09 LAB — LIPASE, BLOOD: Lipase: 39 U/L (ref 11–51)

## 2020-08-09 MED ORDER — IOHEXOL 300 MG/ML  SOLN
100.0000 mL | Freq: Once | INTRAMUSCULAR | Status: AC | PRN
  Administered 2020-08-09: 100 mL via INTRAVENOUS

## 2020-08-09 MED ORDER — SODIUM CHLORIDE 0.9 % IV BOLUS
1000.0000 mL | Freq: Once | INTRAVENOUS | Status: AC
  Administered 2020-08-09: 1000 mL via INTRAVENOUS

## 2020-08-09 NOTE — Discharge Instructions (Addendum)
You were seen in the ER for your nausea, vomiting, and lower abdominal pain.  Your blood work, physical exam, and CT scan were very reassuring.  While the exact cause of your symptoms remains unclear there does not appear to be any emergent issue at this time.  Please follow-up with your primary care doctor.  Return to ER if develop any chest pain, shortness of breath, nausea vomiting does not cough, urine specific

## 2020-08-09 NOTE — ED Provider Notes (Signed)
[facility] EMERGENCY DEPARTMENT Provider Note   CSN: [id number] Arrival date & time: 08/09/20  1321     History Chief Complaint  Patient presents with  . Abdominal Pain  . Vomiting    Julie Dawson is a 60 y.o. female.  60 year old female with history of diabetes, hyperlipidemia presents with complaint of left lower quadrant abdominal pain with nausea and vomiting.  Patient states that she gave herself her Trulicity injection on Friday and then had vomiting all day on Friday and into Saturday, noted to be white, nonbloody, nonbilious.  Denies blood in stools, diarrhea or constipation, dysuria.  Reports chills, no fevers.  States that she has been very thirsty and feels like her mouth is dry, also reports urinary frequency.  No history of DKA.  No other complaints or concerns.        Past Medical History:  Diagnosis Date  . Allergy    spring time only   . Constipation    doesnt use otc medicines  . Diabetes mellitus without complication (HCC)   . History of positive PPD    cannot do the tine test,  has to do CXR  . Hyperlipidemia     Patient Active Problem List   Diagnosis Date Noted  . Chronic constipation 05/05/2020  . Tinea manus 12/03/2019  . Allergic contact dermatitis due to metals 12/03/2019  . Temporal pain 04/29/2019  . Numbness of fingers of both hands 04/29/2019  . Chronic bilateral low back pain without sciatica 02/04/2018  . Cigarette nicotine dependence with nicotine-induced disorder 07/13/2017  . Type 2 diabetes mellitus with complication, without long-term current use of insulin (HCC) 08/23/2016  . 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 INSOMNIA 12/31/2009  . HERPES LABIALIS 01/21/2009  . ONYCHOMYCOSIS 07/29/2008  . HYPERCHOLESTEROLEMIA 07/29/2008  . DENTAL CARIES 04/21/2008  . OBESITY 03/13/2008  . MENOPAUSAL SYNDROME 03/13/2008  . HEADACHE 03/13/2008  . DRUG  ABUSE, HX OF 03/13/2008    Past Surgical History:  Procedure Laterality Date  . cervical adenitis node remova    . ECTOPIC PREGNANCY SURGERY    . MOUTH SURGERY     for wisdom teeth removal     OB History   No obstetric history on file.     Family History  Problem Relation Age of Onset  . Diabetes Mother   . Hypertension Mother   . COPD Mother   . Stroke Father   . Diabetes Father   . Hypertension Father   . Diabetes Sister   . Drug abuse Brother   . Alcohol abuse Sister   . Colon cancer Neg Hx   . Colon polyps Neg Hx   . Esophageal cancer Neg Hx   . Rectal cancer Neg Hx   . Stomach cancer Neg Hx     Social History   Tobacco Use  . Smoking status: Current Every Day Smoker    Packs/day: 1.00    Types: Cigarettes  . Smokeless tobacco: Never Used  Vaping Use  . Vaping Use: Never used  Substance Use Topics  . Alcohol use: No  . 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    Home Medications Prior to Admission medications   Medication Sig Start Date End Date Taking? Authorizing Provider  atorvastatin (LIPITOR) 20 MG tablet Take 1 tablet (20 mg total) by mouth daily at 6 PM. 02/07/20   Olive Bass, FNP  Cinnamon 500 MG capsule Take 500 mg by mouth daily.    [provider]  Dulaglutide (TRULICITY) 0.75 MG/0.5ML SOPN Inject weekly as directed 10/01/19   Olive Bass, FNP  linaclotide Karlene Einstein) 290 MCG CAPS capsule Take 1 capsule (290 mcg total) by mouth daily before breakfast. Patient not taking: Reported on 06/29/2020 05/15/20   Zehr, Princella Pellegrini, PA-C  NEOMYCIN-POLYMYXIN-HYDROCORTISONE (CORTISPORIN) 1 % SOLN OTIC solution Apply 1-2 drops to toe BID after soaking 07/16/20   Hyatt, Max T, DPM    Allergies    Ace inhibitors, Jardiance [empagliflozin], Lisinopril, and Metformin and related  Review of Systems   Review of Systems  Constitutional: Positive for chills. Negative for fever.  Respiratory: Negative for shortness  of breath.   Cardiovascular: Negative for chest pain.  Gastrointestinal: Positive for abdominal pain, nausea and vomiting. Negative for blood in stool, constipation and diarrhea.  Endocrine: Positive for polydipsia and polyuria.  Genitourinary: Positive for frequency. Negative for dysuria.  Musculoskeletal: Negative for arthralgias and myalgias.  Skin: Negative for rash and wound.  Allergic/Immunologic: Positive for immunocompromised state.  Neurological: Negative for weakness.  Psychiatric/Behavioral: Negative for confusion.  All other systems reviewed and are negative.   Physical Exam Updated Vital Signs BP (!) 146/91   Pulse 82   Temp 98.5 F (36.9 C) (Oral)   Resp 20   SpO2 100%   Physical Exam Vitals and nursing note reviewed.  Constitutional:      General: She is not in acute distress.    Appearance: She is well-developed. She is not diaphoretic.  HENT:     Head: Normocephalic and atraumatic.  Cardiovascular:     Rate and Rhythm: Normal rate and regular rhythm.     Heart sounds: Normal heart sounds.  Pulmonary:     Effort: Pulmonary effort is normal.  Abdominal:     Palpations: Abdomen is soft.     Tenderness: There is abdominal tenderness in the left lower quadrant. There is no right CVA tenderness or left CVA tenderness.  Skin:    General: Skin is warm and dry.     Findings: No erythema or rash.  Neurological:     Mental Status: She is alert and oriented to person, place, and time.  Psychiatric:        Behavior: Behavior normal.     ED Results / Procedures / Treatments   Labs (all labs ordered are listed, but only abnormal results are displayed) Labs Reviewed  COMPREHENSIVE METABOLIC PANEL - Abnormal; Notable for the following components:      Result Value   Glucose, Bld 100 (*)    Creatinine, Ser 1.19 (*)    Calcium 8.6 (*)    GFR, Estimated 53 (*)    All other components within normal limits  URINALYSIS, ROUTINE W REFLEX MICROSCOPIC - Abnormal;  Notable for the following components:   APPearance CLOUDY (*)    Leukocytes,Ua TRACE (*)    All other components within normal limits  LIPASE, BLOOD  CBC    EKG None  Radiology CT Abdomen Pelvis W Contrast  Result Date: 08/09/2020 CLINICAL DATA:  60 year old female with abdominal pain. Concern for acute diverticulitis. EXAM: CT ABDOMEN AND PELVIS WITH CONTRAST TECHNIQUE: Multidetector CT imaging of the abdomen and pelvis was performed using the standard protocol following bolus administration of intravenous contrast. CONTRAST:  OMNIPAQUE IOHEXOL 300 MG/ML  SOLN COMPARISON:  CT abdomen pelvis dated 02/25/2020. FINDINGS: Lower chest: The visualized lung bases are clear. No intra-abdominal free air  or free fluid. Hepatobiliary: No focal liver abnormality is seen. No gallstones, gallbladder wall thickening, or biliary dilatation. Pancreas: Unremarkable. No pancreatic ductal dilatation or surrounding inflammatory changes. Spleen: Normal in size without focal abnormality. Adrenals/Urinary Tract: The adrenal glands unremarkable. There is mild fullness of the right renal pelvis. There is no hydronephrosis on either side. There is symmetric enhancement and excretion of contrast by both kidneys. Small bilateral renal cysts and subcentimeter hypodensities which are too small to characterize. The visualized ureters and urinary bladder appear unremarkable. Stomach/Bowel: There is moderate stool throughout the colon. There are small scattered colonic diverticula without active inflammatory changes. There is no bowel obstruction or active inflammation. The appendix is normal. Vascular/Lymphatic: Mild aortoiliac atherosclerotic disease. The IVC is unremarkable. No portal venous gas. There is no adenopathy. Reproductive: The uterus is anteverted and grossly unremarkable. No adnexal masses. Other: None Musculoskeletal: Mild degenerative changes. No acute osseous pathology. IMPRESSION: 1. No acute intra-abdominal  or pelvic pathology. 2. Moderate colonic stool burden. No bowel obstruction. Normal appendix. 3. Aortic Atherosclerosis (ICD10-I70.0). Electronically Signed   By: Elgie Collard M.D.   On: 08/09/2020 17:01    Procedures Procedures   Medications Ordered in ED Medications  sodium chloride 0.9 % bolus 1,000 mL (0 mLs Intravenous Stopped 08/09/20 1802)  iohexol (OMNIPAQUE) 300 MG/ML solution 100 mL (100 mLs Intravenous Contrast Given 08/09/20 1624)    ED Course  I have reviewed the triage vital signs and the nursing notes.  Pertinent labs & imaging results that were available during my care of the patient were reviewed by me and considered in my medical decision making (see chart for details).  Clinical Course as of 08/10/20 1525  Sun Aug 09, 2020  3058 60 year old female with complaint of left lower quadrant abdominal pain with nausea, vomiting and urinary frequency.  On exam found to have left lower quadrant tenderness.  Labs reviewed without significant findings, patient was sent for CT abdomen pelvis with contrast for possible diverticulitis.  Care signed out pending CT. [LM]    Clinical Course User Index [LM] Alden Hipp   MDM Rules/Calculators/A&P                          Final Clinical Impression(s) / ED Diagnoses Final diagnoses:  Left lower quadrant abdominal pain    Rx / DC Orders ED Discharge Orders    None       Jeannie Fend, PA-C 08/10/20 1525    Mancel Bale, MD 08/11/20 1141

## 2020-08-09 NOTE — ED Triage Notes (Signed)
Pt states she has been taking Penicillin for a week.  Reports vomiting and abd cramping since Friday.   Denies diarrhea.  States she took [employer] on Thursday and her weekly Trulicity on Friday.  She believes she has a reaction to her medication.

## 2020-08-09 NOTE — ED Provider Notes (Signed)
  Physical Exam  BP (!) 146/91   Pulse 82   Temp 98.5 F (36.9 C) (Oral)   Resp 20   SpO2 100%   Physical Exam  ED Course/Procedures     Procedures  MDM   Care of this patient was assumed from preceding ED provider Army Melia, PA-C, at time of shift change.  Please see her associated note for further ED course.  In brief this patient has been experiencing nausea, vomiting, and left lower quadrant pain since Friday.  She denies hematemesis, diarrhea, melena, hematochezia, denies fevers or chills at home.  She does endorse recent beginning flaxseed oil supplement for weight loss creatinine chemistries completed today as usual on Friday shortly before her symptoms began.  No she has also been taking penicillin for 1 week for dental infection.  CBC today is unremarkable, mild increase in creatinine to 1.19 from baseline of 1, otherwise unremarkable.  Lipase is normal, UA unremarkable.  At time of shift change patient is awaiting CT of her abdomen pelvis to further evaluate left lower quadrant abdominal pain.  Disposition based on CT results, appears that patient will be discharged home this evening due to reassuring labs and vital signs.  CT scan negative for acute intra-abdominal abnormality.  Patient reevaluated and is asymptomatic at this time.  States she is ready to be discharged home as she is very hungry and wants to go eat dinner.  Vital signs remained stable throughout the patient's stay in the emergency department, and she is well-appearing at this time.  P.o. challenged successfully in the emergency department.  Oral exact etiology for this patient symptoms remains unclear, there is not appear to be any emergent source for her nausea and abdominal pain at this time.  No further work warranted at this time.  Recommend close follow-up with her primary care doctor.  Mackenzy voiced understanding of her medical evaluation and treatment plan.  Each of her questions was answered to her  expressed satisfaction.  Return precautions given.  Patient is well-appearing, stable, and appropriate for discharge at this time.  This chart was dictated using voice recognition software, Dragon. Despite the best efforts of this provider to proofread and correct errors, errors may still occur which can change documentation meaning.     Paris Lore, PA-C 08/09/20 1826    Mancel Bale, MD 08/10/20 1126

## 2020-08-10 ENCOUNTER — Encounter: Payer: BC Managed Care – PPO | Admitting: Nurse Practitioner

## 2020-08-10 ENCOUNTER — Telehealth: Payer: Self-pay | Admitting: Family

## 2020-08-10 NOTE — Telephone Encounter (Signed)
We received a notification and she should receive one too but Lilly needs her to call them about her application. They are asking that she call them to discuss at [phone].

## 2020-08-10 NOTE — Telephone Encounter (Signed)
I have called the pt and gave her the message from the provider and she stated understanding. Pt also had an idea on what other information would be needed.

## 2020-08-21 ENCOUNTER — Encounter: Payer: Self-pay | Admitting: Podiatry

## 2020-08-21 ENCOUNTER — Encounter: Payer: Self-pay | Admitting: Family

## 2020-08-21 MED ORDER — CEPHALEXIN 500 MG PO CAPS: 500.0000 mg | ORAL_CAPSULE | Freq: Two times a day (BID) | ORAL | 0 refills | Status: DC

## 2020-08-24 ENCOUNTER — Telehealth: Payer: Self-pay | Admitting: *Deleted

## 2020-08-24 NOTE — Telephone Encounter (Signed)
completed

## 2020-09-09 NOTE — Telephone Encounter (Signed)
error 

## 2020-11-14 ENCOUNTER — Encounter: Payer: Self-pay | Admitting: Family

## 2020-11-27 ENCOUNTER — Telehealth: Payer: Self-pay | Admitting: Family

## 2020-11-27 DIAGNOSIS — E118 Type 2 diabetes mellitus with unspecified complications: Secondary | ICD-10-CM

## 2020-11-27 NOTE — Telephone Encounter (Signed)
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 She would like for and order to be placed. Please advise

## 2020-11-27 NOTE — Telephone Encounter (Signed)
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 Okay to place the order?  5 mo ago  (06/29/20) 9 mo ago  (02/07/20) 1 yr ago  (10/25/19) 1 yr ago  (04/29/19) 1 yr ago  (12/14/18) 2 yr ago  (09/27/18) 2 yr ago  (02/19/18)  Hgb A1c MFr Bld 4.6 - 6.5 % 7.3 High   7.7 High  CM  6.9 High  CM  6.4 CM  7.1 High

## 2020-11-30 NOTE — Telephone Encounter (Signed)
Called patient and she is scheduled to come in for fasting lab 12/01/20 @ 8:30 am.

## 2020-11-30 NOTE — Telephone Encounter (Signed)
Labs in for Future lab visit. Victorino Dike will call pt to schedule appt.

## 2020-12-01 ENCOUNTER — Other Ambulatory Visit (INDEPENDENT_AMBULATORY_CARE_PROVIDER_SITE_OTHER): Payer: BC Managed Care – PPO

## 2020-12-01 ENCOUNTER — Other Ambulatory Visit: Payer: Self-pay

## 2020-12-01 DIAGNOSIS — E118 Type 2 diabetes mellitus with unspecified complications: Secondary | ICD-10-CM | POA: Diagnosis not present

## 2020-12-01 LAB — HEMOGLOBIN A1C: Hgb A1c MFr Bld: 7.7 % — ABNORMAL HIGH (ref 4.6–6.5)

## 2020-12-11 ENCOUNTER — Encounter: Payer: Self-pay | Admitting: Family

## 2020-12-11 ENCOUNTER — Ambulatory Visit (INDEPENDENT_AMBULATORY_CARE_PROVIDER_SITE_OTHER)
Admission: RE | Admit: 2020-12-11 | Discharge: 2020-12-11 | Disposition: A | Discharge status: Home / Self Care | Payer: BC Managed Care – PPO | Source: Ambulatory Visit | Attending: Family | Admitting: Family

## 2020-12-11 ENCOUNTER — Other Ambulatory Visit: Payer: Self-pay

## 2020-12-11 ENCOUNTER — Ambulatory Visit: Payer: BC Managed Care – PPO | Admitting: Family

## 2020-12-11 VITALS — BP 118/58 | HR 94 | Temp 97.8°F | Ht 63.0 in | Wt 165.5 lb

## 2020-12-11 DIAGNOSIS — M5441 Lumbago with sciatica, right side: Secondary | ICD-10-CM

## 2020-12-11 DIAGNOSIS — E118 Type 2 diabetes mellitus with unspecified complications: Secondary | ICD-10-CM

## 2020-12-11 DIAGNOSIS — G8929 Other chronic pain: Secondary | ICD-10-CM

## 2020-12-11 DIAGNOSIS — M545 Low back pain, unspecified: Secondary | ICD-10-CM | POA: Diagnosis not present

## 2020-12-11 MED ORDER — MELOXICAM 15 MG PO TABS: 15.0000 mg | ORAL_TABLET | Freq: Every day | ORAL | 0 refills | Status: DC

## 2020-12-11 NOTE — Patient Instructions (Signed)
Sciatica Rehab °Ask your health care provider which exercises are safe for you. Do exercises exactly as told by your health care provider and adjust them as directed. It is normal to feel mild stretching, pulling, tightness, or discomfort as you do these exercises. Stop right away if you feel sudden pain or your pain gets worse. Do not begin these exercises until told by your health care provider. °Stretching and range-of-motion exercises °These exercises warm up your muscles and joints and improve the movement and flexibility of your hips and back. These exercises also help to relieve pain, numbness, and tingling. °Sciatic nerve glide °Sit in a chair with your head facing down toward your chest. Place your hands behind your back. Let your shoulders slump forward. °Slowly straighten one of your legs while you tilt your head back as if you are looking toward the ceiling. Only straighten your leg as far as you can without making your symptoms worse. °Hold this position for __________ seconds. °Slowly return to the starting position. °Repeat with your other leg. °Repeat __________ times. Complete this exercise __________ times a day. °Knee to chest with hip adduction and internal rotation ° °Lie on your back on a firm surface with both legs straight. °Bend one of your knees and move it up toward your chest until you feel a gentle stretch in your lower back and buttock. Then, move your knee toward the shoulder that is on the opposite side from your leg. This is hip adduction and internal rotation. °Hold your leg in this position by holding on to the front of your knee. °Hold this position for __________ seconds. °Slowly return to the starting position. °Repeat with your other leg. °Repeat __________ times. Complete this exercise __________ times a day. °Prone extension on elbows ° °Lie on your abdomen on a firm surface. A bed may be too soft for this exercise. °Prop yourself up on your elbows. °Use your arms to help  lift your chest up until you feel a gentle stretch in your abdomen and your lower back. °This will place some of your body weight on your elbows. If this is uncomfortable, try stacking pillows under your chest. °Your hips should stay down, against the surface that you are lying on. Keep your hip and back muscles relaxed. °Hold this position for __________ seconds. °Slowly relax your upper body and return to the starting position. °Repeat __________ times. Complete this exercise __________ times a day. °Strengthening exercises °These exercises build strength and endurance in your back. Endurance is the ability to use your muscles for a long time, even after they get tired. °Pelvic tilt °This exercise strengthens the muscles that lie deep in the abdomen. °Lie on your back on a firm surface. Bend your knees and keep your feet flat on the floor. °Tense your abdominal muscles. Tip your pelvis up toward the ceiling and flatten your lower back into the floor. °To help with this exercise, you may place a small towel under your lower back and try to push your back into the towel. °Hold this position for __________ seconds. °Let your muscles relax completely before you repeat this exercise. °Repeat __________ times. Complete this exercise __________ times a day. °Alternating arm and leg raises ° °Get on your hands and knees on a firm surface. If you are on a hard floor, you may want to use padding, such as an exercise mat, to cushion your knees. °Line up your arms and legs. Your hands should be directly below your shoulders,   and your knees should be directly below your hips. °Lift your left leg behind you. At the same time, raise your right arm and straighten it in front of you. °Do not lift your leg higher than your hip. °Do not lift your arm higher than your shoulder. °Keep your abdominal and back muscles tight. °Keep your hips facing the ground. °Do not arch your back. °Keep your balance carefully, and do not hold your  breath. °Hold this position for __________ seconds. °Slowly return to the starting position. °Repeat with your right leg and your left arm. °Repeat __________ times. Complete this exercise __________ times a day. °Posture and body mechanics °Good posture and healthy body mechanics can help to relieve stress in your body's tissues and joints. Body mechanics refers to the movements and positions of your body while you do your daily activities. Posture is part of body mechanics. Good posture means: °Your spine is in its natural S-curve position (neutral). °Your shoulders are pulled back slightly. °Your head is not tipped forward. °Follow these guidelines to improve your posture and body mechanics in your everyday activities. °Standing ° °When standing, keep your spine neutral and your feet about hip width apart. Keep a slight bend in your knees. Your ears, shoulders, and hips should line up. °When you do a task in which you stand in one place for a long time, place one foot up on a stable object that is 2-4 inches (5-10 cm) high, such as a footstool. This helps keep your spine neutral. °Sitting ° °When sitting, keep your spine neutral and keep your feet flat on the floor. Use a footrest, if necessary, and keep your thighs parallel to the floor. Avoid rounding your shoulders, and avoid tilting your head forward. °When working at a desk or a computer, keep your desk at a height where your hands are slightly lower than your elbows. Slide your chair under your desk so you are close enough to maintain good posture. °When working at a computer, place your monitor at a height where you are looking straight ahead and you do not have to tilt your head forward or downward to look at the screen. °Resting °When lying down and resting, avoid positions that are most painful for you. °If you have pain with activities such as sitting, bending, stooping, or squatting, lie in a position in which your body does not bend very much. For  example, avoid curling up on your side with your arms and knees near your chest (fetal position). °If you have pain with activities such as standing for a long time or reaching with your arms, lie with your spine in a neutral position and bend your knees slightly. Try the following positions: °Lying on your side with a pillow between your knees. °Lying on your back with a pillow under your knees. °Lifting ° °When lifting objects, keep your feet at least shoulder width apart and tighten your abdominal muscles. °Bend your knees and hips and keep your spine neutral. It is important to lift using the strength of your legs, not your back. Do not lock your knees straight out. °Always ask for help to lift heavy or awkward objects. °This information is not intended to replace advice given to you by your health care provider. Make sure you discuss any questions you have with your health care provider. °Document Revised: 08/10/2018 Document Reviewed: 05/10/2018 °[employer] Patient Education © 2022 [employer] ° °

## 2020-12-11 NOTE — Progress Notes (Signed)
Julie Dawson is a 60 y.o. female with the following history as recorded in EpicCare:  Patient Active Problem List   Diagnosis Date Noted   Chronic constipation 05/05/2020   Tinea manus 12/03/2019   Allergic contact dermatitis due to metals 12/03/2019   Temporal pain 04/29/2019   Numbness of fingers of both hands 04/29/2019   Chronic bilateral low back pain without sciatica 02/04/2018   Cigarette nicotine dependence with nicotine-induced disorder 07/13/2017   Type 2 diabetes mellitus with complication, without long-term current use of insulin (HCC) 08/23/2016   PPD positive, treated 03/14/2013   NONSPEC REACT TUBERCULIN SKIN 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 06/08/2010   INSOMNIA 12/31/2009   HERPES LABIALIS 01/21/2009   ONYCHOMYCOSIS 07/29/2008   HYPERCHOLESTEROLEMIA 07/29/2008   DENTAL CARIES 04/21/2008   OBESITY 03/13/2008   MENOPAUSAL SYNDROME 03/13/2008   HEADACHE 03/13/2008   DRUG ABUSE, HX OF 03/13/2008    Current Outpatient Medications  Medication Sig Dispense Refill   atorvastatin (LIPITOR) 20 MG tablet Take 1 tablet (20 mg total) by mouth daily at 6 PM. 90 tablet 3   Dulaglutide (TRULICITY) 0.75 MG/0.5ML SOPN Inject weekly as directed 4 pen 0   meloxicam (MOBIC) 15 MG tablet Take 1 tablet (15 mg total) by mouth daily. 30 tablet 0   Cinnamon 500 MG capsule Take 500 mg by mouth daily. (Patient not taking: Reported on 12/11/2020)     No current facility-administered medications for this visit.    Allergies: Ace inhibitors, Jardiance [empagliflozin], Lisinopril, and Metformin and related  Past Medical History:  Diagnosis Date   Allergy    spring time only    Constipation    doesnt use otc medicines   Diabetes mellitus without complication (HCC)    History of positive PPD    cannot do the tine test,  has to do CXR   Hyperlipidemia     Past Surgical History:  Procedure Laterality Date   cervical adenitis node remova     ECTOPIC PREGNANCY SURGERY     MOUTH SURGERY     for  wisdom teeth removal    Family History  Problem Relation Age of Onset   Diabetes Mother    Hypertension Mother    COPD Mother    Stroke Father    Diabetes Father    Hypertension Father    Diabetes Sister    Drug abuse Brother    Alcohol abuse Sister    Colon cancer Neg Hx    Colon polyps Neg Hx    Esophageal cancer Neg Hx    Rectal cancer Neg Hx    Stomach cancer Neg Hx     Social History   Tobacco Use   Smoking status: Every Day    Packs/day: 1.00    Types: Cigarettes   Smokeless tobacco: Never  Substance Use Topics   Alcohol use: No    Subjective:   Presents with concerns for right leg pain; feels like pain radiating down into right leg; symptoms present on and off for years; 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  Head: Normocephalic and atraumatic  Eyes: Sclera and conjunctiva clear; pupils round and reactive to light; extraocular movements intact  Ears: External normal; canals clear; tympanic membranes normal  Oropharynx: Pink, supple. No suspicious lesions  Neck: Supple without thyromegaly, adenopathy  Lungs: Respirations unlabored; clear to auscultation bilaterally without wheeze, rales, rhonchi  CVS exam: normal rate and regular rhythm.  Musculoskeletal: No deformities; no active joint inflammation  Extremities: No edema, cyanosis, clubbing  Vessels: Symmetric bilaterally  Neurologic: Alert and oriented; speech intact; face symmetrical; moves all extremities well; CNII-XII intact without focal deficit   Assessment:  1. Chronic right-sided low back pain with right-sided sciatica   2. Type 2 diabetes  mellitus with complication, without long-term current use of insulin (HCC)     Plan:  Update Xray; trial of Mobic 15 mg daily; stretching exercises discussed; follow up to be determined; Encouraged to work on [employer] choices/ exercise; will re-check labs in 3 months-if no improvement in Hgba1c by then, will need to increase dosage 1.5 mg Trulicity;   This visit occurred during the SARS-CoV-2 public health emergency.  Safety protocols were in place, including screening questions prior to the visit, additional usage of staff PPE, and extensive cleaning of exam room while observing appropriate contact time as indicated for disinfecting solutions.    No follow-ups on file.  Orders Placed This Encounter  Procedures   DG Lumbar Spine Complete    Standing Status:   Future    Number of Occurrences:   1    Standing Expiration Date:   12/11/2021    Order Specific Question:   Reason for Exam (SYMPTOM  OR DIAGNOSIS REQUIRED)    Answer:   low back pain    Order Specific Question:   Is patient pregnant?    Answer:   No    Order Specific Question:   Preferred imaging location?    Answer:   [facility]    Requested Prescriptions   Signed Prescriptions Disp Refills   meloxicam (MOBIC) 15 MG tablet 30 tablet 0    Sig: Take 1 tablet (15 mg total) by mouth daily.

## 2020-12-14 ENCOUNTER — Encounter: Payer: Self-pay | Admitting: Family

## 2020-12-14 NOTE — Telephone Encounter (Signed)
Form placed on your laptop .

## 2020-12-15 ENCOUNTER — Telehealth: Payer: Self-pay | Admitting: Family

## 2020-12-15 ENCOUNTER — Other Ambulatory Visit: Payer: Self-pay | Admitting: Family

## 2020-12-15 DIAGNOSIS — M545 Low back pain, unspecified: Secondary | ICD-10-CM

## 2020-12-15 NOTE — Telephone Encounter (Signed)
Forms received once again and have given it to provider to complete.

## 2020-12-15 NOTE — Telephone Encounter (Signed)
I have received the forms and they are in [facility] folder to be completed.

## 2020-12-15 NOTE — Telephone Encounter (Signed)
A copy has been placed in scan bin and original is upfront for pick up Called pt again with no answer.

## 2020-12-15 NOTE — Telephone Encounter (Signed)
Forms have been completed and faxed with confirmation. I have placed copy in scan and original for pick up. Pt was notified via VM that it is ready for pick up

## 2020-12-15 NOTE — Telephone Encounter (Signed)
I have called pt to let her know her form is ready for pick up and we have faxed it per her request.

## 2020-12-15 NOTE — Telephone Encounter (Signed)
Pt dropped off document to be filled out by provider (Document of Primary care consent for performing employment test - 4 pages) Pt would like documents to be faxed when ready to [phone] and also to call pt and give her a copy to pick up. Document put at front office tray under providers name.

## 2020-12-23 NOTE — Progress Notes (Signed)
   Subjective:    I'm seeing this patient as a consultation for:  Ria Clock, FNP. Note will be routed back to referring provider/PCP.  CC: R leg pain  I, Molly Weber, LAT, ATC, am serving as scribe for Dr. Clementeen Graham.  HPI: Pt is a 60 y/o female presenting w/ c/o R lower leg/calf pain x one month w/ no specific MOI noted. She works as a Midwife.  Radiating pain: yes in her R lower leg/calf and into her R plantar foot R LE numbness/tingling: yes in her R plantar foot/arch R LE weakness: No Aggravating factors: prolonged sitting; driving; Treatments tried: Mobic; stretching; massage  Diagnostic imaging: L-spine XR- 12/11/20  Past medical history, Surgical history, Family history, Social history, Allergies, and medications have been entered into the medical record, reviewed.   Review of Systems: No new headache, visual changes, nausea, vomiting, diarrhea, constipation, dizziness, abdominal pain, skin rash, fevers, chills, night sweats, weight loss, swollen lymph nodes, body aches, joint swelling, muscle aches, chest pain, shortness of breath, mood changes, visual or auditory hallucinations.   Objective:    Vitals:   12/24/20 0929  BP: 122/78  Pulse: 93  SpO2: 96%   General: Well Developed, well nourished, and in no acute distress.  Neuro/Psych: Alert and oriented x3, extra-ocular muscles intact, able to move all 4 extremities, sensation grossly intact. Skin: Warm and dry, no rashes noted.  Respiratory: Not using accessory muscles, speaking in full sentences, trachea midline.  Cardiovascular: Pulses palpable, no extremity edema. Abdomen: Does not appear distended. MSK: Right calf normal.  Normal motion. No particular injury palpation.  Normal strength. No palpable cords. L-spine nontender midline normal lumbar motion.  Lower extremity strength is intact. Mildly positive slump test.  Lab and Radiology Results  EXAM: LUMBAR SPINE - COMPLETE 4+ VIEW   COMPARISON:   None.   FINDINGS: Multilevel degenerative disc disease with anterior osteophytes. Lower lumbar facet degenerative changes. No fracture or traumatic malalignment.   IMPRESSION: Degenerative changes as above.     Electronically Signed   By: Gerome Sam III M.D.   On: 12/12/2020 18:27 I, Clementeen Graham, personally (independently) visualized and performed the interpretation of the images attached in this note.    Impression and Recommendations:    Assessment and Plan: 60 y.o. female with right lateral calf pain thought to be L5 radiculopathy.  DVT is a remote possibility although patient does not have the characteristic changes would expect on physical exam for DVT.  Plan to treat for lumbar radiculopathy with short course of prednisone and trial gabapentin and if not improving proceed to MRI.  Reassess in 6 weeks.  Also assess for possibility of DVT with D-dimer and if positive proceed to vascular ultrasound.Marland Kitchen  PDMP not reviewed this encounter. Orders Placed This Encounter  Procedures   D-Dimer, Quantitative   Meds ordered this encounter  Medications   predniSONE (DELTASONE) 50 MG tablet    Sig: Take 1 tablet (50 mg total) by mouth daily.    Dispense:  5 tablet    Refill:  0   gabapentin (NEURONTIN) 300 MG capsule    Sig: Take 1 capsule (300 mg total) by mouth 3 (three) times daily as needed.    Dispense:  90 capsule    Refill:  3    Discussed warning signs or symptoms. Please see discharge instructions. Patient expresses understanding.   The above documentation has been reviewed and is accurate and complete Clementeen Graham, M.D.

## 2020-12-24 ENCOUNTER — Ambulatory Visit (INDEPENDENT_AMBULATORY_CARE_PROVIDER_SITE_OTHER): Payer: BC Managed Care – PPO | Admitting: Family Medicine

## 2020-12-24 ENCOUNTER — Other Ambulatory Visit: Payer: Self-pay

## 2020-12-24 ENCOUNTER — Encounter: Payer: Self-pay | Admitting: Family Medicine

## 2020-12-24 VITALS — BP 122/78 | HR 93 | Ht 63.0 in | Wt 166.2 lb

## 2020-12-24 DIAGNOSIS — R7989 Other specified abnormal findings of blood chemistry: Secondary | ICD-10-CM

## 2020-12-24 DIAGNOSIS — M7989 Other specified soft tissue disorders: Secondary | ICD-10-CM | POA: Diagnosis not present

## 2020-12-24 DIAGNOSIS — M5416 Radiculopathy, lumbar region: Secondary | ICD-10-CM

## 2020-12-24 LAB — D-DIMER, QUANTITATIVE: D-Dimer, Quant: 1.06 mcg/mL FEU — ABNORMAL HIGH (ref <0.50)

## 2020-12-24 MED ORDER — PREDNISONE 50 MG PO TABS: 50.0000 mg | ORAL_TABLET | Freq: Every day | ORAL | 0 refills | Status: DC

## 2020-12-24 MED ORDER — GABAPENTIN 300 MG PO CAPS: 300.0000 mg | ORAL_CAPSULE | Freq: Three times a day (TID) | ORAL | 3 refills | Status: DC | PRN

## 2020-12-24 NOTE — Patient Instructions (Addendum)
Thank you for coming in today.   Please get labs today before you leave   Take the prednisone for 5 days.  Ok to stop it early if its annoying   Try the gabapentin for nerve pain mostly at night.   If not improving let me know.  Next step would be MRI of your low back to look at the nerves and plan for back injections.   Radicular Pain Radicular pain is a type of pain that spreads from your back or neck along a spinal nerve. Spinal nerves are nerves that leave the spinal cord and go to the muscles. Radicular pain is sometimes called radiculopathy, radiculitis, or a pinched nerve. When you have this type of pain, you may also have weakness, numbness, or tingling in the area of your body that is supplied by the nerve. The pain may feel sharp and burning. Depending on which spinal nerve is affected, the pain may occur in the: Neck area (cervical radicular pain). You may also feel pain, numbness, weakness, or tingling in the arms. Mid-spine area (thoracic radicular pain). You would feel this pain in the back and chest. This type is rare. Lower back area (lumbar radicular pain). You would feel this pain as low back pain. You may feel pain, numbness, weakness, or tingling in the buttocks or legs. Sciatica is a type of lumbar radicular pain that shoots down the back of the leg. Radicular pain occurs when one of the spinal nerves becomes irritated or squeezed (compressed). It is often caused by something pushing on a spinal nerve, such as one of the bones of the spine (vertebrae) or one of the round cushions between vertebrae (intervertebral disks). This can result from: An injury. Wear and tear or aging of a disk. The growth of a bone spur that pushes on the nerve. Radicular pain often goes away when you follow instructions from your healthcare provider for relieving pain at home. Follow these instructions at home: Managing pain     If directed, put ice on the affected area: Put ice in a plastic  bag. Place a towel between your skin and the bag. Leave the ice on for 20 minutes, 2-3 times a day. If directed, apply heat to the affected area as often as told by your health care provider. Use the heat source that your health care provider recommends, such as a moist heat pack or a heating pad. Place a towel between your skin and the heat source. Leave the heat on for 20-30 minutes. Remove the heat if your skin turns bright red. This is especially important if you are unable to feel pain, heat, or cold. You may have a greater risk of getting burned. Activity  Do not sit or rest in bed for long periods of time. Try to stay as active as possible. Ask your health care provider what type of exercise or activity is best for you. Avoid activities that make your pain worse, such as bending and lifting. Do not lift anything that is heavier than 10 lb (4.5 kg), or the limit that you are told, until your health care provider says that it is safe. Practice using proper technique when lifting items. Proper lifting technique involves bending your knees and rising up. Do strength and range-of-motion exercises only as told by your health care provider or physical therapist.  General instructions Take over-the-counter and prescription medicines only as told by your health care provider. Pay attention to any changes in your symptoms. Keep  all follow-up visits as told by your health care provider. This is important. Your health care provider may send you to a physical therapist to help with this pain. Contact a health care provider if: Your pain and other symptoms get worse. Your pain medicine is not helping. Your pain has not improved after a few weeks of home care. You have a fever. Get help right away if: You have severe pain, weakness, or numbness. You have difficulty with bladder or bowel control. Summary Radicular pain is a type of pain that spreads from your back or neck along a spinal  nerve. When you have radicular pain, you may also have weakness, numbness, or tingling in the area of your body that is supplied by the nerve. The pain may feel sharp or burning. Radicular pain may be treated with ice, heat, medicines, or physical therapy. This information is not intended to replace advice given to you by your health care provider. Make sure you discuss any questions you have with your healthcare provider. Document Revised: 10/31/2017 Document Reviewed: 10/31/2017 Elsevier Patient Education  2022 [employer].

## 2020-12-25 NOTE — Addendum Note (Signed)
Addended by: Rodolph Bong on: 12/25/2020 06:21 AM   Modules accepted: Orders

## 2020-12-25 NOTE — Progress Notes (Signed)
D-dimer test (blood test for DVT) is mildly elevated.  This is enough for [place] to proceed to the ultrasound to look for a blood clot.  I have ordered the ultrasound test that he should hear about this scheduling soon.  Ultrasound ordered to [facility] location.  Please contact location

## 2020-12-29 ENCOUNTER — Encounter: Payer: Self-pay | Admitting: Family Medicine

## 2020-12-30 ENCOUNTER — Other Ambulatory Visit: Payer: Self-pay

## 2020-12-30 ENCOUNTER — Ambulatory Visit (HOSPITAL_COMMUNITY)
Admission: RE | Admit: 2020-12-30 | Discharge: 2020-12-30 | Disposition: A | Discharge status: Home / Self Care | Payer: BC Managed Care – PPO | Source: Ambulatory Visit | Attending: Family Medicine | Admitting: Family Medicine

## 2020-12-30 DIAGNOSIS — M7989 Other specified soft tissue disorders: Secondary | ICD-10-CM | POA: Diagnosis not present

## 2020-12-31 NOTE — Progress Notes (Signed)
No evidence of DVT.

## 2021-02-11 ENCOUNTER — Other Ambulatory Visit: Payer: Self-pay | Admitting: Family

## 2021-02-11 DIAGNOSIS — E7800 Pure hypercholesterolemia, unspecified: Secondary | ICD-10-CM

## 2021-03-05 ENCOUNTER — Telehealth: Payer: Self-pay | Admitting: Family

## 2021-03-05 NOTE — Telephone Encounter (Signed)
Please remind her I would like her to schedule diabetes follow up in January 2023.  Can she verify what dosage of Trulicity she is getting from [facility]? .75 or 1.5?

## 2021-03-05 NOTE — Telephone Encounter (Signed)
-----   Message from Olive Bass, FNP sent at 12/11/2020 10:28 AM EDT ----- Check on labs for diabetes

## 2021-03-05 NOTE — Telephone Encounter (Signed)
I have called pt and there was no answer so I left a message to call back.  ?

## 2021-03-13 ENCOUNTER — Other Ambulatory Visit: Payer: Self-pay | Admitting: Family

## 2021-03-13 DIAGNOSIS — E7800 Pure hypercholesterolemia, unspecified: Secondary | ICD-10-CM

## 2021-03-19 ENCOUNTER — Ambulatory Visit: Payer: BC Managed Care – PPO | Admitting: Family

## 2021-03-19 ENCOUNTER — Encounter: Payer: Self-pay | Admitting: Family

## 2021-03-19 ENCOUNTER — Other Ambulatory Visit: Payer: Self-pay

## 2021-03-19 VITALS — BP 118/60 | HR 98 | Temp 97.8°F | Ht 62.0 in | Wt 161.8 lb

## 2021-03-19 DIAGNOSIS — E118 Type 2 diabetes mellitus with unspecified complications: Secondary | ICD-10-CM | POA: Diagnosis not present

## 2021-03-19 DIAGNOSIS — Z1231 Encounter for screening mammogram for malignant neoplasm of breast: Secondary | ICD-10-CM | POA: Diagnosis not present

## 2021-03-19 NOTE — Progress Notes (Signed)
Julie Dawson is a 60 y.o. female with the following history as recorded in EpicCare:  Patient Active Problem List   Diagnosis Date Noted   Chronic constipation 05/05/2020   Tinea manus 12/03/2019   Allergic contact dermatitis due to metals 12/03/2019   Temporal pain 04/29/2019   Numbness of fingers of both hands 04/29/2019   Chronic bilateral low back pain without sciatica 02/04/2018   Cigarette nicotine dependence with nicotine-induced disorder 07/13/2017   Type 2 diabetes mellitus with complication, without long-term current use of insulin (HCC) 08/23/2016   PPD positive, treated 03/14/2013   NONSPEC REACT TUBERCULIN SKIN 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 06/08/2010   INSOMNIA 12/31/2009   HERPES LABIALIS 01/21/2009   ONYCHOMYCOSIS 07/29/2008   HYPERCHOLESTEROLEMIA 07/29/2008   DENTAL CARIES 04/21/2008   OBESITY 03/13/2008   MENOPAUSAL SYNDROME 03/13/2008   HEADACHE 03/13/2008   DRUG ABUSE, HX OF 03/13/2008    Current Outpatient Medications  Medication Sig Dispense Refill   atorvastatin (LIPITOR) 20 MG tablet TAKE 1 TABLET BY MOUTH ONCE DAILY AT 6 PM 90 tablet 1   Dulaglutide (TRULICITY) 9.47 SJ/6.2EZ SOPN Inject weekly as directed 4 pen 0   No current facility-administered medications for this visit.    Allergies: Ace inhibitors, Jardiance [empagliflozin], Lisinopril, and Metformin and related  Past Medical History:  Diagnosis Date   Allergy    spring time only    Constipation    doesnt use otc medicines   Diabetes mellitus without complication ([place])    History of positive PPD    cannot do the tine test,  has to do CXR   Hyperlipidemia     Past Surgical History:  Procedure Laterality Date   cervical adenitis node remova     ECTOPIC PREGNANCY SURGERY     MOUTH SURGERY     for wisdom teeth removal    Family History  Problem Relation Age of Onset   Diabetes Mother    Hypertension Mother    COPD Mother    Stroke Father    Diabetes Father    Hypertension Father     Diabetes Sister    Drug abuse Brother    Alcohol abuse Sister    Colon cancer Neg Hx    Colon polyps Neg Hx    Esophageal cancer Neg Hx    Rectal cancer Neg Hx    Stomach cancer Neg Hx     Social History   Tobacco Use   Smoking status: Every Day    Packs/day: 1.00    Types: Cigarettes   Smokeless tobacco: Never  Substance Use Topics   Alcohol use: No    Subjective:  3 month follow up on Type 2 Diabetes; has lost 5 pounds since last OV; did not increase dosage of Trulicity as discussed;  Also complaining of right sided hip/ buttock pain x 2-3 weeks; 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  Head: Normocephalic and atraumatic  Lungs: Respirations unlabored; clear to auscultation bilaterally without wheeze, rales, rhonchi  CVS exam: normal rate and regular rhythm.  Musculoskeletal: No deformities; no active joint inflammation  Extremities: No edema,  cyanosis, clubbing  Vessels: Symmetric bilaterally  Neurologic: Alert and oriented; speech intact; face symmetrical; moves all extremities well; CNII-XII intact without focal deficit   Assessment:  1. Type 2 diabetes mellitus with complication, without long-term current use of insulin ([place])   2. Visit for screening mammogram     Plan:  Update labs today; follow up for pap smear/ Zostavax in 2-3 weeks ( needs Tuesday appointment);  Order for mammogram updated;   This visit occurred during the SARS-CoV-2 public health emergency.  Safety protocols were in place, including screening questions prior to the visit, additional usage of staff PPE, and extensive cleaning of exam room while observing appropriate contact time as indicated for  disinfecting solutions.    Return in about 2 weeks (around 04/02/2021) for Needs Tuesday appointment.  Orders Placed This Encounter  Procedures   MM Digital Screening    Standing Status:   Future    Standing Expiration Date:   03/19/2022    Order Specific Question:   Reason for Exam (SYMPTOM  OR DIAGNOSIS REQUIRED)    Answer:   screening mammogram    Order Specific Question:   Is the patient pregnant?    Answer:   No    Order Specific Question:   Preferred imaging location?    Answer:   [facility]   Comp Met ([facility])   Hemoglobin A1c    Requested Prescriptions    No prescriptions requested or ordered in this encounter

## 2021-03-20 LAB — COMPREHENSIVE METABOLIC PANEL
AG Ratio: 1.8 (calc) (ref 1.0–2.5)
ALT: 17 U/L (ref 6–29)
AST: 16 U/L (ref 10–35)
Albumin: 4.3 g/dL (ref 3.6–5.1)
Alkaline phosphatase (APISO): 118 U/L (ref 37–153)
BUN/Creatinine Ratio: 14 (calc) (ref 6–22)
BUN: 16 mg/dL (ref 7–25)
CO2: 27 mmol/L (ref 20–32)
Calcium: 9.3 mg/dL (ref 8.6–10.4)
Chloride: 106 mmol/L (ref 98–110)
Creat: 1.12 mg/dL — ABNORMAL HIGH (ref 0.50–1.05)
Globulin: 2.4 g/dL (calc) (ref 1.9–3.7)
Glucose, Bld: 150 mg/dL — ABNORMAL HIGH (ref 65–99)
Potassium: 4.2 mmol/L (ref 3.5–5.3)
Sodium: 140 mmol/L (ref 135–146)
Total Bilirubin: 0.6 mg/dL (ref 0.2–1.2)
Total Protein: 6.7 g/dL (ref 6.1–8.1)

## 2021-03-20 LAB — HEMOGLOBIN A1C
Hgb A1c MFr Bld: 6.6 % of total Hgb — ABNORMAL HIGH (ref <5.7)
Mean Plasma Glucose: 143 mg/dL
eAG (mmol/L): 7.9 mmol/L

## 2021-03-23 ENCOUNTER — Ambulatory Visit: Payer: BC Managed Care – PPO | Admitting: Family Medicine

## 2021-03-23 NOTE — Progress Notes (Deleted)
   I, Philbert Riser, LAT, ATC acting as a scribe for Clementeen Graham, MD.  Kamea Dacosta is a 60 y.o. female who presents to [facility] today for R buttock pain. Pt was last seen by Dr. Denyse Amass on 12/24/20 for R lateral calf pain thought to be due to L5 radiculopathy. After additional testing, pt's D-dimer was mildly elevated and DVT was r/u w/ a vascular US. Today, pt reports  Dx testing: 12/30/20 Bilat LE vascular US (-)  12/24/20 Labs (D-dimer)  12/11/20 L-spine XR  07/15/15 L-spine XR  Pertinent review of systems: ***  Relevant historical information: ***   Exam:  There were no vitals taken for this visit. General: Well Developed, well nourished, and in no acute distress.   MSK: ***    Lab and Radiology Results No results found for this or any previous visit (from the past 72 hour(s)). No results found.     Assessment and Plan: 60 y.o. female with ***   PDMP not reviewed this encounter. No orders of the defined types were placed in this encounter.  No orders of the defined types were placed in this encounter.    Discussed warning signs or symptoms. Please see discharge instructions. Patient expresses understanding.   ***

## 2021-04-06 ENCOUNTER — Ambulatory Visit: Payer: BC Managed Care – PPO | Admitting: Family

## 2021-04-30 ENCOUNTER — Other Ambulatory Visit: Payer: Self-pay

## 2021-04-30 ENCOUNTER — Ambulatory Visit
Admission: RE | Admit: 2021-04-30 | Discharge: 2021-04-30 | Disposition: A | Discharge status: Home / Self Care | Payer: BC Managed Care – PPO | Source: Ambulatory Visit | Attending: Family | Admitting: Family

## 2021-04-30 DIAGNOSIS — Z1231 Encounter for screening mammogram for malignant neoplasm of breast: Secondary | ICD-10-CM

## 2021-06-25 ENCOUNTER — Telehealth: Payer: Self-pay

## 2021-06-25 ENCOUNTER — Telehealth: Payer: Self-pay | Admitting: Family

## 2021-06-25 ENCOUNTER — Encounter: Payer: Self-pay | Admitting: Family

## 2021-06-25 ENCOUNTER — Other Ambulatory Visit: Payer: Self-pay | Admitting: Family

## 2021-06-25 MED ORDER — TRULICITY 0.75 MG/0.5ML ~~LOC~~ SOAJ: SUBCUTANEOUS | 2 refills | Status: DC

## 2021-06-25 NOTE — Telephone Encounter (Signed)
Forms have been received and placed in provider folder to be completed.  °

## 2021-06-25 NOTE — Telephone Encounter (Signed)
Noted. Thanks.

## 2021-06-25 NOTE — Telephone Encounter (Signed)
Pt dropped off document to be filled out by provider [facility]- Patient Asistance program- 8 pages in a yellow large envelope) Pt would like document to be faxed when ready. Document put at front office tray under providers name.

## 2021-06-25 NOTE — Telephone Encounter (Signed)
PA initiated via Covermymeds; KEY : [id number]. Awaiting determination.

## 2021-07-02 NOTE — Telephone Encounter (Signed)
Forms have been completed faxed to [facility] cares and called pt to notify her. There was no answer so I left a message to call back.  ?

## 2021-07-06 NOTE — Telephone Encounter (Signed)
PA approved. ? ?07-MAR-23:07-MAR-24 Trulicity 0.75MG /0.5ML Max[place]ass SOPN Quantity:2; ?

## 2021-07-13 ENCOUNTER — Encounter: Payer: Self-pay | Admitting: Family

## 2021-07-16 ENCOUNTER — Ambulatory Visit: Payer: BC Managed Care – PPO | Admitting: Family

## 2021-07-16 ENCOUNTER — Other Ambulatory Visit: Payer: Self-pay | Admitting: Family

## 2021-07-16 ENCOUNTER — Other Ambulatory Visit (INDEPENDENT_AMBULATORY_CARE_PROVIDER_SITE_OTHER): Payer: BC Managed Care – PPO

## 2021-07-16 ENCOUNTER — Encounter: Payer: Self-pay | Admitting: Family

## 2021-07-16 VITALS — BP 130/80 | HR 87 | Temp 98.8°F | Ht 63.0 in | Wt 160.4 lb

## 2021-07-16 DIAGNOSIS — E785 Hyperlipidemia, unspecified: Secondary | ICD-10-CM

## 2021-07-16 DIAGNOSIS — L739 Follicular disorder, unspecified: Secondary | ICD-10-CM | POA: Diagnosis not present

## 2021-07-16 DIAGNOSIS — E118 Type 2 diabetes mellitus with unspecified complications: Secondary | ICD-10-CM | POA: Diagnosis not present

## 2021-07-16 LAB — COMPREHENSIVE METABOLIC PANEL
ALT: 13 U/L (ref 0–35)
AST: 13 U/L (ref 0–37)
Albumin: 4.7 g/dL (ref 3.5–5.2)
Alkaline Phosphatase: 111 U/L (ref 39–117)
BUN: 13 mg/dL (ref 6–23)
CO2: 28 mEq/L (ref 19–32)
Calcium: 9.5 mg/dL (ref 8.4–10.5)
Chloride: 104 mEq/L (ref 96–112)
Creatinine, Ser: 1.2 mg/dL (ref 0.40–1.20)
GFR: 49.22 mL/min — ABNORMAL LOW (ref >60.00)
Glucose, Bld: 84 mg/dL (ref 70–99)
Potassium: 4.1 mEq/L (ref 3.5–5.1)
Sodium: 139 mEq/L (ref 135–145)
Total Bilirubin: 0.8 mg/dL (ref 0.2–1.2)
Total Protein: 7.3 g/dL (ref 6.0–8.3)

## 2021-07-16 LAB — CBC WITH DIFFERENTIAL/PLATELET
Basophils Absolute: 0.1 10*3/uL (ref 0.0–0.1)
Basophils Relative: 0.6 % (ref 0.0–3.0)
Eosinophils Absolute: 0.1 10*3/uL (ref 0.0–0.7)
Eosinophils Relative: 0.9 % (ref 0.0–5.0)
HCT: 39.5 % (ref 36.0–46.0)
Hemoglobin: 13.2 g/dL (ref 12.0–15.0)
Lymphocytes Relative: 46.4 % — ABNORMAL HIGH (ref 12.0–46.0)
Lymphs Abs: 4.2 10*3/uL — ABNORMAL HIGH (ref 0.7–4.0)
MCHC: 33.4 g/dL (ref 30.0–36.0)
MCV: 83.2 fl (ref 78.0–100.0)
Monocytes Absolute: 0.6 10*3/uL (ref 0.1–1.0)
Monocytes Relative: 6.2 % (ref 3.0–12.0)
Neutro Abs: 4.1 10*3/uL (ref 1.4–7.7)
Neutrophils Relative %: 45.9 % (ref 43.0–77.0)
Platelets: 227 10*3/uL (ref 150.0–400.0)
RBC: 4.74 Mil/uL (ref 3.87–5.11)
RDW: 13.8 % (ref 11.5–15.5)
WBC: 9 10*3/uL (ref 4.0–10.5)

## 2021-07-16 LAB — LIPID PANEL
Cholesterol: 215 mg/dL — ABNORMAL HIGH (ref 0–200)
HDL: 44 mg/dL (ref >39.00)
LDL Cholesterol: 148 mg/dL — ABNORMAL HIGH (ref 0–99)
NonHDL: 170.68
Total CHOL/HDL Ratio: 5
Triglycerides: 113 mg/dL (ref 0.0–149.0)
VLDL: 22.6 mg/dL (ref 0.0–40.0)

## 2021-07-16 LAB — HEMOGLOBIN A1C: Hgb A1c MFr Bld: 6.9 % — ABNORMAL HIGH (ref 4.6–6.5)

## 2021-07-16 MED ORDER — DOXYCYCLINE HYCLATE 100 MG PO TABS: 100.0000 mg | ORAL_TABLET | Freq: Two times a day (BID) | ORAL | 0 refills | Status: DC

## 2021-07-16 MED ORDER — MUPIROCIN 2 % EX OINT: 1.0000 "application " | TOPICAL_OINTMENT | Freq: Three times a day (TID) | CUTANEOUS | 0 refills | Status: DC

## 2021-07-16 NOTE — Progress Notes (Signed)
?Julie Dawson is a 61 y.o. female with the following history as recorded in EpicCare:  ?Patient Active Problem List  ? Diagnosis Date Noted  ? Chronic constipation 05/05/2020  ? Tinea manus 12/03/2019  ? Allergic contact dermatitis due to metals 12/03/2019  ? Temporal pain 04/29/2019  ? Numbness of fingers of both hands 04/29/2019  ? Chronic bilateral low back pain without sciatica 02/04/2018  ? Cigarette nicotine dependence with nicotine-induced disorder 07/13/2017  ? Type 2 diabetes mellitus with complication, without long-term current use of insulin ([place]) 08/23/2016  ? 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 [place] SKIN 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 06/08/2010  ? INSOMNIA 12/31/2009  ? HERPES LABIALIS 01/21/2009  ? ONYCHOMYCOSIS 07/29/2008  ? HYPERCHOLESTEROLEMIA 07/29/2008  ? DENTAL CARIES 04/21/2008  ? OBESITY 03/13/2008  ? MENOPAUSAL SYNDROME 03/13/2008  ? HEADACHE 03/13/2008  ? DRUG ABUSE, HX OF 03/13/2008  ?  ?Current Outpatient Medications  ?Medication Sig Dispense Refill  ? atorvastatin (LIPITOR) 20 MG tablet TAKE 1 TABLET BY MOUTH ONCE DAILY AT 6 PM 90 tablet 1  ? doxycycline (VIBRA-TABS) 100 MG tablet Take 1 tablet (100 mg total) by mouth 2 (two) times daily. 14 tablet 0  ? Dulaglutide (TRULICITY) 0.63 KZ/6.0FU SOPN Inject weekly as directed 2 mL 2  ? mupirocin ointment (BACTROBAN) 2 % Apply 1 application. topically 3 (three) times daily. 22 g 0  ? ?No current facility-administered medications for this visit.  ?  ?Allergies: Ace inhibitors, Jardiance [empagliflozin], Lisinopril, and Metformin and related  ?Past Medical History:  ?Diagnosis Date  ? Allergy   ? spring time only   ? Constipation   ? doesnt use otc medicines  ? Diabetes mellitus without complication ([place])   ? History of positive PPD   ? cannot do the tine test,  has to do CXR  ? Hyperlipidemia   ?  ?Past Surgical History:  ?Procedure Laterality Date  ? cervical adenitis node remova    ? ECTOPIC PREGNANCY SURGERY    ? MOUTH SURGERY     ? for wisdom teeth removal  ?  ?Family History  ?Problem Relation Age of Onset  ? Diabetes Mother   ? Hypertension Mother   ? COPD Mother   ? Stroke Father   ? Diabetes Father   ? Hypertension Father   ? Diabetes Sister   ? Alcohol abuse Sister   ? Drug abuse Brother   ? Colon cancer Neg Hx   ? Colon polyps Neg Hx   ? Esophageal cancer Neg Hx   ? Rectal cancer Neg Hx   ? Stomach cancer Neg Hx   ? Breast cancer Neg Hx   ?  ?Social History  ? ?Tobacco Use  ? Smoking status: Every Day  ?  Packs/day: 1.00  ?  Types: Cigarettes  ? Smokeless tobacco: Never  ?Substance Use Topics  ? Alcohol use: No  ?  ?Subjective:  ? ?Follow up on Type 2 Diabetes; needs updated patient assistance paperwork for Tr[place]icity completed; ? ?Concerned about small infected area noted in right groin x 2 weeks; has been apply warm compresses with some benefit;  ? ? ? ?Objective:  ?Vitals:  ? 07/16/21 0931  ?BP: 130/80  ?Pulse: 87  ?Temp: 98.8 ?F (37.1 ?C)  ?TempSrc: Oral  ?SpO2: 98%  ?Weight: 160 lb 6.4 oz (72.8 kg)  ?Height: 5' 3"  (1.6 m)  ?  ?General: Well developed, well nourished, in no acute distress  ?Skin : Warm and dry. Localized area of  folliculitis noted over right outer groin ?Head: Normocephalic and atraumatic  ?Eyes: Sclera and conjunctiva clear; pupils round and reactive to light; extraocular movements intact  ?Ears: External normal; canals clear; tympanic membranes normal  ?Oropharynx: Pink, supple. No suspicious lesions  ?Neck: Supple without thyromegaly, adenopathy  ?Lungs: Respirations unlabored;  ?Neurologic: Alert and oriented; speech intact; face symmetrical; moves all extremities well; CNII-XII intact without focal deficit  ? ?Assessment:  ?1. Type 2 diabetes mellitus with complication, without long-term current use of insulin ([place])   ?2. Folliculitis   ?  ?Plan:  ?Update labs today; continue Trulicity; paperwork completed for patient assistance; ?Continue to apply warm compresses; Rx for Doxycycline 100 mg bid x 7 days;  follow up worse, no better.  ? ?This visit occurred during the SARS-CoV-2 public health emergency.  Safety protocols were in place, including screening questions prior to the visit, additional usage of staff PPE, and extensive cleaning of exam room while observing appropriate contact time as indicated for disinfecting solutions.  ? ? ?No follow-ups on file.  ?Orders Placed This Encounter  ?Procedures  ? CBC with Differential/Platelet  ? Comp Met (CMET)  ? Hemoglobin A1c  ?  ?Requested Prescriptions  ? ?Signed Prescriptions Disp Refills  ? doxycycline (VIBRA-TABS) 100 MG tablet 14 tablet 0  ?  Sig: Take 1 tablet (100 mg total) by mouth 2 (two) times daily.  ? mupirocin ointment (BACTROBAN) 2 % 22 g 0  ?  Sig: Apply 1 application. topically 3 (three) times daily.  ?  ? ?

## 2021-07-26 ENCOUNTER — Other Ambulatory Visit: Payer: Self-pay | Admitting: Family

## 2021-07-26 ENCOUNTER — Telehealth: Payer: Self-pay

## 2021-07-26 ENCOUNTER — Encounter: Payer: Self-pay | Admitting: Family

## 2021-07-26 MED ORDER — TIRZEPATIDE 2.5 MG/0.5ML ~~LOC~~ SOAJ: 2.5000 mg | SUBCUTANEOUS | 0 refills | Status: DC

## 2021-07-26 NOTE — Telephone Encounter (Signed)
PA initiated via Covermymeds; KEY: : [id number]. PA approved.  ? ?27-MAR-23:23-SEP-23 Mounjaro 2.5MG /0.5ML [place] SOPN Quantity:2; ?

## 2021-08-04 ENCOUNTER — Encounter: Payer: Self-pay | Admitting: Family

## 2021-08-05 ENCOUNTER — Other Ambulatory Visit: Payer: Self-pay | Admitting: Family

## 2021-08-05 ENCOUNTER — Ambulatory Visit (INDEPENDENT_AMBULATORY_CARE_PROVIDER_SITE_OTHER)
Admission: RE | Admit: 2021-08-05 | Discharge: 2021-08-05 | Disposition: A | Discharge status: Home / Self Care | Payer: BC Managed Care – PPO | Source: Ambulatory Visit | Attending: Family | Admitting: Family

## 2021-08-05 DIAGNOSIS — Z111 Encounter for screening for respiratory tuberculosis: Secondary | ICD-10-CM

## 2021-08-05 DIAGNOSIS — M47814 Spondylosis without myelopathy or radiculopathy, thoracic region: Secondary | ICD-10-CM | POA: Diagnosis not present

## 2021-08-09 ENCOUNTER — Encounter: Payer: Self-pay | Admitting: Family

## 2021-08-19 ENCOUNTER — Other Ambulatory Visit: Payer: Self-pay | Admitting: Family

## 2021-08-20 ENCOUNTER — Telehealth: Payer: Self-pay

## 2021-08-20 ENCOUNTER — Encounter: Payer: Self-pay | Admitting: Family

## 2021-08-20 ENCOUNTER — Other Ambulatory Visit: Payer: Self-pay | Admitting: Family

## 2021-08-20 NOTE — Telephone Encounter (Signed)
PA initiated via Covermymeds; KEY: [id number]. PA cancelled by plan.  ? ?Message from Plan ?Prior Authorization duplicate/approved ?

## 2021-08-25 ENCOUNTER — Encounter: Payer: Self-pay | Admitting: Family

## 2021-08-26 ENCOUNTER — Encounter: Payer: Self-pay | Admitting: Family

## 2021-09-03 ENCOUNTER — Ambulatory Visit (INDEPENDENT_AMBULATORY_CARE_PROVIDER_SITE_OTHER): Payer: BC Managed Care – PPO | Admitting: Family

## 2021-09-03 ENCOUNTER — Encounter: Payer: Self-pay | Admitting: Family

## 2021-09-03 VITALS — BP 134/70 | HR 87 | Temp 97.8°F | Ht 63.0 in | Wt 156.0 lb

## 2021-09-03 DIAGNOSIS — E119 Type 2 diabetes mellitus without complications: Secondary | ICD-10-CM

## 2021-09-03 LAB — COMPREHENSIVE METABOLIC PANEL
ALT: 11 U/L (ref 0–35)
AST: 14 U/L (ref 0–37)
Albumin: 4.7 g/dL (ref 3.5–5.2)
Alkaline Phosphatase: 109 U/L (ref 39–117)
BUN: 21 mg/dL (ref 6–23)
CO2: 27 mEq/L (ref 19–32)
Calcium: 9.4 mg/dL (ref 8.4–10.5)
Chloride: 104 mEq/L (ref 96–112)
Creatinine, Ser: 1.24 mg/dL — ABNORMAL HIGH (ref 0.40–1.20)
GFR: 47.27 mL/min — ABNORMAL LOW (ref >60.00)
Glucose, Bld: 78 mg/dL (ref 70–99)
Potassium: 4.2 mEq/L (ref 3.5–5.1)
Sodium: 140 mEq/L (ref 135–145)
Total Bilirubin: 0.8 mg/dL (ref 0.2–1.2)
Total Protein: 7.3 g/dL (ref 6.0–8.3)

## 2021-09-03 LAB — CBC WITH DIFFERENTIAL/PLATELET
Basophils Absolute: 0 10*3/uL (ref 0.0–0.1)
Basophils Relative: 0.4 % (ref 0.0–3.0)
Eosinophils Absolute: 0.1 10*3/uL (ref 0.0–0.7)
Eosinophils Relative: 0.9 % (ref 0.0–5.0)
HCT: 41.4 % (ref 36.0–46.0)
Hemoglobin: 13.8 g/dL (ref 12.0–15.0)
Lymphocytes Relative: 45.3 % (ref 12.0–46.0)
Lymphs Abs: 3.7 10*3/uL (ref 0.7–4.0)
MCHC: 33.3 g/dL (ref 30.0–36.0)
MCV: 83.6 fl (ref 78.0–100.0)
Monocytes Absolute: 0.5 10*3/uL (ref 0.1–1.0)
Monocytes Relative: 6.2 % (ref 3.0–12.0)
Neutro Abs: 3.9 10*3/uL (ref 1.4–7.7)
Neutrophils Relative %: 47.2 % (ref 43.0–77.0)
Platelets: 231 10*3/uL (ref 150.0–400.0)
RBC: 4.95 Mil/uL (ref 3.87–5.11)
RDW: 13.7 % (ref 11.5–15.5)
WBC: 8.2 10*3/uL (ref 4.0–10.5)

## 2021-09-03 LAB — HEMOGLOBIN A1C: Hgb A1c MFr Bld: 6.5 % (ref 4.6–6.5)

## 2021-09-03 NOTE — Progress Notes (Signed)
?Julie Dawson is a 61 y.o. female with the following history as recorded in EpicCare:  ?Patient Active Problem List  ? Diagnosis Date Noted  ? Chronic constipation 05/05/2020  ? Tinea manus 12/03/2019  ? Allergic contact dermatitis due to metals 12/03/2019  ? Temporal pain 04/29/2019  ? Numbness of fingers of both hands 04/29/2019  ? Chronic bilateral low back pain without sciatica 02/04/2018  ? Cigarette nicotine dependence with nicotine-induced disorder 07/13/2017  ? Type 2 diabetes mellitus with complication, without long-term current use of insulin ([place]) 08/23/2016  ? 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 [place] SKIN 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 06/08/2010  ? INSOMNIA 12/31/2009  ? HERPES LABIALIS 01/21/2009  ? ONYCHOMYCOSIS 07/29/2008  ? HYPERCHOLESTEROLEMIA 07/29/2008  ? DENTAL CARIES 04/21/2008  ? OBESITY 03/13/2008  ? MENOPAUSAL SYNDROME 03/13/2008  ? HEADACHE 03/13/2008  ? DRUG ABUSE, HX OF 03/13/2008  ?  ?Current Outpatient Medications  ?Medication Sig Dispense Refill  ? atorvastatin (LIPITOR) 20 MG tablet TAKE 1 TABLET BY MOUTH ONCE DAILY AT 6 PM 90 tablet 1  ? MOUNJARO 2.5 MG/0.5ML Pen INJECT 2.5 MG SUBCUTANEOUSLY ONCE A WEEK 4 mL 0  ? mupirocin ointment (BACTROBAN) 2 % Apply 1 application. topically 3 (three) times daily. 22 g 0  ? ?No current facility-administered medications for this visit.  ?  ?Allergies: Ace inhibitors, Jardiance [empagliflozin], Lisinopril, and Metformin and related  ?Past Medical History:  ?Diagnosis Date  ? Allergy   ? spring time only   ? Constipation   ? doesnt use otc medicines  ? Diabetes mellitus without complication ([place])   ? History of positive PPD   ? cannot do the tine test,  has to do CXR  ? Hyperlipidemia   ?  ?Past Surgical History:  ?Procedure Laterality Date  ? cervical adenitis node remova    ? ECTOPIC PREGNANCY SURGERY    ? MOUTH SURGERY    ? for wisdom teeth removal  ?  ?Family History  ?Problem Relation Age of Onset  ? Diabetes Mother   ?  Hypertension Mother   ? COPD Mother   ? Stroke Father   ? Diabetes Father   ? Hypertension Father   ? Diabetes Sister   ? Alcohol abuse Sister   ? Drug abuse Brother   ? Colon cancer Neg Hx   ? Colon polyps Neg Hx   ? Esophageal cancer Neg Hx   ? Rectal cancer Neg Hx   ? Stomach cancer Neg Hx   ? Breast cancer Neg Hx   ?  ?Social History  ? ?Tobacco Use  ? Smoking status: Every Day  ?  Packs/day: 1.00  ?  Types: Cigarettes  ? Smokeless tobacco: Never  ?Substance Use Topics  ? Alcohol use: No  ?  ?Subjective:  ? ?Presents today with concerns for weight loss related to [facility]; according to our records, patient has lost 4 pounds since March; per patient, she feels like she hast 14 pounds- patient thought she weighed 168 pounds; Had a normal CXR in March 2023;  ?Patient actually likes Darcel Bayley and would like to continue if appropriate;  ? ? ?Objective:  ?Vitals:  ? 09/03/21 0818  ?BP: 134/70  ?Weight: 156 lb (70.8 kg)  ?Height: _0  (1.6 m)  ?  ?General: Well developed, well nourished, in no acute distress  ?Skin : Warm and dry.  ?Head: Normocephalic and atraumatic  ?Lungs: Respirations unlabored; clear to auscultation bilaterally without wheeze, rales, rhonchi  ?CVS exam: normal  rate and regular rhythm.  ?Neurologic: Alert and oriented; speech intact; face symmetrical; moves all extremities well; CNII-XII intact without focal deficit  ? ?Assessment:  ?1. Type 2 diabetes mellitus without complication, without long-term current use of insulin ([place])   ?  ?Plan:  ?Good response to [facility]; patient has only lost 4 pounds since starting medication- she was confused about her starting weight; comfortable with continuing medicaiton; normal CXR in early April; check CBC, CMP, Hgba1c;  ? ?No follow-ups on file.  ?Orders Placed This Encounter  ?Procedures  ? CBC with Differential/Platelet  ? Comp Met (CMET)  ? Hemoglobin A1c  ?  ?Requested Prescriptions  ? ? No prescriptions requested or ordered in this encounter  ?  ? ?

## 2021-09-16 ENCOUNTER — Other Ambulatory Visit: Payer: Self-pay | Admitting: Family

## 2021-09-16 ENCOUNTER — Telehealth: Payer: Self-pay | Admitting: Family

## 2021-09-16 MED ORDER — MOUNJARO 2.5 MG/0.5ML ~~LOC~~ SOAJ: SUBCUTANEOUS | 2 refills | Status: DC

## 2021-09-16 NOTE — Telephone Encounter (Signed)
Pt states pharm has not received refill and she would like it resent.

## 2021-09-16 NOTE — Telephone Encounter (Signed)
Medication: MOUNJARO 2.5 MG/0.5ML Pen      Has the patient contacted their pharmacy? Yes.   (If no, request that the patient contact the pharmacy for the refill.) (If yes, when and what did the pharmacy advise?)     Preferred Pharmacy (with phone number or street name): [facility] [phone]

## 2021-09-16 NOTE — Telephone Encounter (Signed)
Patient notified that we can send in rx and also appt made for follow up.  Rx sent into [facility].

## 2021-09-17 NOTE — Telephone Encounter (Signed)
Pt has picked up her medication.

## 2021-10-04 ENCOUNTER — Encounter: Payer: Self-pay | Admitting: Family

## 2021-11-08 ENCOUNTER — Encounter: Payer: Self-pay | Admitting: Family

## 2021-11-18 ENCOUNTER — Other Ambulatory Visit: Payer: Self-pay | Admitting: Family

## 2021-11-18 ENCOUNTER — Encounter: Payer: Self-pay | Admitting: Family

## 2021-11-18 MED ORDER — SITAGLIPTIN PHOSPHATE 100 MG PO TABS: 100.0000 mg | ORAL_TABLET | Freq: Every day | ORAL | 2 refills | Status: DC

## 2021-11-19 ENCOUNTER — Telehealth: Payer: Self-pay

## 2021-11-19 NOTE — Telephone Encounter (Signed)
PA started:   Approved today 21-JUL-23:20-JUL-24 Januvia 100MG  OR TABS Quantity:30;

## 2021-12-01 ENCOUNTER — Encounter: Payer: Self-pay | Admitting: Family

## 2021-12-03 ENCOUNTER — Ambulatory Visit (INDEPENDENT_AMBULATORY_CARE_PROVIDER_SITE_OTHER): Payer: BC Managed Care – PPO | Admitting: Family

## 2021-12-03 ENCOUNTER — Other Ambulatory Visit: Payer: Self-pay | Admitting: Family

## 2021-12-03 ENCOUNTER — Encounter: Payer: Self-pay | Admitting: Family

## 2021-12-03 VITALS — BP 110/60 | HR 96 | Temp 98.0°F | Resp 18 | Wt 151.0 lb

## 2021-12-03 DIAGNOSIS — J019 Acute sinusitis, unspecified: Secondary | ICD-10-CM

## 2021-12-03 DIAGNOSIS — R509 Fever, unspecified: Secondary | ICD-10-CM | POA: Diagnosis not present

## 2021-12-03 DIAGNOSIS — U071 COVID-19: Secondary | ICD-10-CM

## 2021-12-03 DIAGNOSIS — R5383 Other fatigue: Secondary | ICD-10-CM | POA: Diagnosis not present

## 2021-12-03 LAB — POCT INFLUENZA A/B
Influenza A, POC: NEGATIVE
Influenza B, POC: NEGATIVE

## 2021-12-03 LAB — POC COVID19 BINAXNOW: SARS Coronavirus 2 Ag: POSITIVE — AB

## 2021-12-03 MED ORDER — AMOXICILLIN-POT CLAVULANATE 875-125 MG PO TABS: 1.0000 | ORAL_TABLET | Freq: Two times a day (BID) | ORAL | 0 refills | Status: AC

## 2021-12-03 NOTE — Progress Notes (Signed)
Julie Dawson is a 61 y.o. female with the following history as recorded in EpicCare:  Patient Active Problem List   Diagnosis Date Noted   Chronic constipation 05/05/2020   Tinea manus 12/03/2019   Allergic contact dermatitis due to metals 12/03/2019   Temporal pain 04/29/2019   Numbness of fingers of both hands 04/29/2019   Chronic bilateral low back pain without sciatica 02/04/2018   Cigarette nicotine dependence with nicotine-induced disorder 07/13/2017   Type 2 diabetes mellitus with complication, without long-term current use of insulin ([facility]) 08/23/2016   PPD positive, treated 03/14/2013   NONSPEC REACT TUBERCULIN SKIN 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 06/08/2010   INSOMNIA 12/31/2009   HERPES LABIALIS 01/21/2009   ONYCHOMYCOSIS 07/29/2008   HYPERCHOLESTEROLEMIA 07/29/2008   DENTAL CARIES 04/21/2008   OBESITY 03/13/2008   MENOPAUSAL SYNDROME 03/13/2008   HEADACHE 03/13/2008   DRUG ABUSE, HX OF 03/13/2008    Current Outpatient Medications  Medication Sig Dispense Refill   amoxicillin-clavulanate (AUGMENTIN) 875-125 MG tablet Take 1 tablet by mouth 2 (two) times daily for 10 days. 20 tablet 0   atorvastatin (LIPITOR) 20 MG tablet TAKE 1 TABLET BY MOUTH ONCE DAILY AT 6 PM 90 tablet 1   mupirocin ointment (BACTROBAN) 2 % Apply 1 application. topically 3 (three) times daily. 22 g 0   sitaGLIPtin (JANUVIA) 100 MG tablet Take 1 tablet (100 mg total) by mouth daily. 30 tablet 2   No current facility-administered medications for this visit.    Allergies: Ace inhibitors, Jardiance [empagliflozin], Lisinopril, and Metformin and related  Past Medical History:  Diagnosis Date   Allergy    spring time only    Constipation    doesnt use otc medicines   Diabetes mellitus without complication ([facility])    History of positive PPD    cannot do the tine test,  has to do CXR   Hyperlipidemia     Past Surgical History:  Procedure Laterality Date   cervical adenitis node remova     ECTOPIC  PREGNANCY SURGERY     MOUTH SURGERY     for wisdom teeth removal    Family History  Problem Relation Age of Onset   Diabetes Mother    Hypertension Mother    COPD Mother    Stroke Father    Diabetes Father    Hypertension Father    Diabetes Sister    Alcohol abuse Sister    Drug abuse Brother    Colon cancer Neg Hx    Colon polyps Neg Hx    Esophageal cancer Neg Hx    Rectal cancer Neg Hx    Stomach cancer Neg Hx    Breast cancer Neg Hx     Social History   Tobacco Use   Smoking status: Every Day    Packs/day: 1.00    Types: Cigarettes   Smokeless tobacco: Never  Substance Use Topics   Alcohol use: No    Subjective:  5-6 day history of fever, body congestion/ cough; notes that she called EMS to her house on Monday- they evaluated patient and told her to take Tylenol;  Notes that today is the first day she felt like getting out of the bed;  Has been using Diabetic Tussin; + frontal headache/ ear pain;     Objective:  Vitals:   12/03/21 1019  BP: 110/60  Pulse: 96  Resp: 18  Temp: 98 F (36.7 C)  TempSrc: Temporal  SpO2: 95%  Weight: 151 lb (68.5 kg)  General: Well developed, well nourished, in no acute distress  Skin : Warm and dry.  Head: Normocephalic and atraumatic  Eyes: Sclera and conjunctiva clear; pupils round and reactive to light; extraocular movements intact  Ears: External normal; canals clear; tympanic membranes normal  Oropharynx: Pink, supple. No suspicious lesions  Neck: Supple without thyromegaly, adenopathy  Lungs: Respirations unlabored; clear to auscultation bilaterally without wheeze, rales, rhonchi  CVS exam: normal rate and regular rhythm.  Neurologic: Alert and oriented; speech intact; face symmetrical; moves all extremities well; CNII-XII intact without focal deficit   Assessment:  1. Fever, unspecified fever cause   2. COVID-19   3. Other fatigue   4. Acute sinusitis, recurrence not specified, unspecified location     Plan:   COVID test was positive in office; patient has been sick for at least 5 days so will hold antivirals; will treat for suspected acute sinus infection with Augmentin; continue OTC Delsym; work note given as requested;   No follow-ups on file.  Orders Placed This Encounter  Procedures   POCT Influenza A/B   POC COVID-19 BinaxNow    Order Specific Question:   Previously tested for COVID-19    Answer:   No    Order Specific Question:   Resident in a congregate (group) care setting    Answer:   Unknown    Order Specific Question:   Employed in healthcare setting    Answer:   Unknown    Order Specific Question:   Pregnant    Answer:   No    Requested Prescriptions   Signed Prescriptions Disp Refills   amoxicillin-clavulanate (AUGMENTIN) 875-125 MG tablet 20 tablet 0    Sig: Take 1 tablet by mouth 2 (two) times daily for 10 days.

## 2021-12-06 ENCOUNTER — Encounter: Payer: Self-pay | Admitting: Family

## 2021-12-06 NOTE — Telephone Encounter (Signed)
Do you want pt to get retested. She was Covid pos in the office. I have called the pt and left a message to call back.

## 2021-12-21 ENCOUNTER — Encounter: Payer: Self-pay | Admitting: Family

## 2021-12-21 ENCOUNTER — Ambulatory Visit (INDEPENDENT_AMBULATORY_CARE_PROVIDER_SITE_OTHER): Payer: BC Managed Care – PPO | Admitting: Family

## 2021-12-21 VITALS — BP 138/80 | HR 91 | Temp 98.6°F | Ht 62.0 in | Wt 151.2 lb

## 2021-12-21 DIAGNOSIS — E118 Type 2 diabetes mellitus with unspecified complications: Secondary | ICD-10-CM | POA: Diagnosis not present

## 2021-12-21 LAB — CBC WITH DIFFERENTIAL/PLATELET
Basophils Absolute: 0 10*3/uL (ref 0.0–0.1)
Basophils Relative: 0.5 % (ref 0.0–3.0)
Eosinophils Absolute: 0.1 10*3/uL (ref 0.0–0.7)
Eosinophils Relative: 0.9 % (ref 0.0–5.0)
HCT: 38 % (ref 36.0–46.0)
Hemoglobin: 12.6 g/dL (ref 12.0–15.0)
Lymphocytes Relative: 39.2 % (ref 12.0–46.0)
Lymphs Abs: 3.3 10*3/uL (ref 0.7–4.0)
MCHC: 33.1 g/dL (ref 30.0–36.0)
MCV: 83.8 fl (ref 78.0–100.0)
Monocytes Absolute: 0.6 10*3/uL (ref 0.1–1.0)
Monocytes Relative: 7.1 % (ref 3.0–12.0)
Neutro Abs: 4.4 10*3/uL (ref 1.4–7.7)
Neutrophils Relative %: 52.3 % (ref 43.0–77.0)
Platelets: 292 10*3/uL (ref 150.0–400.0)
RBC: 4.54 Mil/uL (ref 3.87–5.11)
RDW: 13.8 % (ref 11.5–15.5)
WBC: 8.5 10*3/uL (ref 4.0–10.5)

## 2021-12-21 LAB — COMPREHENSIVE METABOLIC PANEL
ALT: 20 U/L (ref 0–35)
AST: 17 U/L (ref 0–37)
Albumin: 4.4 g/dL (ref 3.5–5.2)
Alkaline Phosphatase: 92 U/L (ref 39–117)
BUN: 13 mg/dL (ref 6–23)
CO2: 28 mEq/L (ref 19–32)
Calcium: 9.1 mg/dL (ref 8.4–10.5)
Chloride: 103 mEq/L (ref 96–112)
Creatinine, Ser: 1.09 mg/dL (ref 0.40–1.20)
GFR: 55.07 mL/min — ABNORMAL LOW (ref >60.00)
Glucose, Bld: 95 mg/dL (ref 70–99)
Potassium: 4.4 mEq/L (ref 3.5–5.1)
Sodium: 140 mEq/L (ref 135–145)
Total Bilirubin: 0.6 mg/dL (ref 0.2–1.2)
Total Protein: 7 g/dL (ref 6.0–8.3)

## 2021-12-21 LAB — MICROALBUMIN / CREATININE URINE RATIO
Creatinine,U: 79.6 mg/dL
Microalb Creat Ratio: 1.1 mg/g (ref 0.0–30.0)
Microalb, Ur: 0.9 mg/dL (ref 0.0–1.9)

## 2021-12-21 LAB — HEMOGLOBIN A1C: Hgb A1c MFr Bld: 6.8 % — ABNORMAL HIGH (ref 4.6–6.5)

## 2021-12-21 NOTE — Progress Notes (Signed)
Julie Dawson is a 61 y.o. 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 (Patient not taking: Reported on 12/21/2021) 30 tablet 2   No current facility-administered medications for this visit.    Allergies: Ace inhibitors, Jardiance [empagliflozin], Lisinopril, and Metformin and related  Past Medical History:  Diagnosis Date   Allergy    spring time only    Constipation    doesnt use otc medicines   Diabetes mellitus without complication ([place])    History of positive PPD    cannot do the tine test,  has to do CXR   Hyperlipidemia     Past Surgical History:  Procedure Laterality Date   cervical adenitis node remova     ECTOPIC PREGNANCY SURGERY     MOUTH SURGERY     for wisdom teeth removal    Family History  Problem Relation Age of Onset   Diabetes Mother    Hypertension Mother    COPD Mother    Stroke  Father    Diabetes Father    Hypertension Father    Diabetes Sister    Alcohol abuse Sister    Drug abuse Brother    Colon cancer Neg Hx    Colon polyps Neg Hx    Esophageal cancer Neg Hx    Rectal cancer Neg Hx    Stomach cancer Neg Hx    Breast cancer Neg Hx     Social History   Tobacco Use   Smoking status: Every Day    Packs/day: 1.00    Types: Cigarettes   Smokeless tobacco: Never  Substance Use Topics   Alcohol use: No    Subjective:   Patient presents for follow up on Type 2 Diabetes; has lost 9 pounds since March 2023; would like to try to manage with diet alone;  Knows she needs to quit smoking- considering quitting smoking;  Denies any chest pain, shortness of breath, blurred vision or headache.      Objective:  Vitals:   12/21/21 0844  BP: 138/80  Pulse: 91  Temp: 98.6 F (37 C)  TempSrc: Oral  SpO2: 99%  Weight: 151 lb 3.2 oz (68.6 kg)  Height: _0  (1.575 m)    General: Well developed, well nourished, in no acute distress  Skin : Warm and dry.  Head: Normocephalic and atraumatic  Eyes: Sclera and conjunctiva clear; pupils round and reactive to light; extraocular movements  intact  Ears: External normal; canals clear; tympanic membranes normal  Oropharynx: Pink, supple. No suspicious lesions  Neck: Supple without thyromegaly, adenopathy  Lungs: Respirations unlabored; clear to auscultation bilaterally without wheeze, rales, rhonchi  CVS exam: normal rate and regular rhythm.  Neurologic: Alert and oriented; speech intact; face symmetrical; moves all extremities well; CNII-XII intact without focal deficit   Assessment:  1. Type 2 diabetes mellitus with complication, without long-term current use of insulin ([place])     Plan:  Patient prefers to try treating with diet; will hold Januvia for now; congratulated patient on weight loss/ stressed need to quit smoking; follow up in 3-4 months;   No follow-ups on file.  Orders Placed This Encounter   Procedures   CBC with Differential/Platelet   Comp Met (CMET)   Hemoglobin A1c   Urine Microalbumin w/creat. ratio    Requested Prescriptions    No prescriptions requested or ordered in this encounter

## 2022-01-07 ENCOUNTER — Ambulatory Visit: Payer: BC Managed Care – PPO | Admitting: Family

## 2022-01-10 ENCOUNTER — Other Ambulatory Visit: Payer: Self-pay | Admitting: Family

## 2022-01-10 DIAGNOSIS — E7800 Pure hypercholesterolemia, unspecified: Secondary | ICD-10-CM

## 2022-01-29 ENCOUNTER — Encounter: Payer: Self-pay | Admitting: Family

## 2022-02-01 ENCOUNTER — Encounter: Payer: Self-pay | Admitting: Family

## 2022-02-01 ENCOUNTER — Emergency Department (HOSPITAL_COMMUNITY): Payer: BC Managed Care – PPO

## 2022-02-01 ENCOUNTER — Emergency Department (HOSPITAL_COMMUNITY)
Admission: EM | Admit: 2022-02-01 | Discharge: 2022-02-01 | Disposition: A | Discharge status: Home / Self Care | Payer: BC Managed Care – PPO | Attending: Emergency Medicine | Admitting: Emergency Medicine

## 2022-02-01 ENCOUNTER — Other Ambulatory Visit: Payer: Self-pay

## 2022-02-01 DIAGNOSIS — M25512 Pain in left shoulder: Secondary | ICD-10-CM | POA: Diagnosis not present

## 2022-02-01 DIAGNOSIS — R9431 Abnormal electrocardiogram [ECG] [EKG]: Secondary | ICD-10-CM | POA: Diagnosis not present

## 2022-02-01 DIAGNOSIS — E119 Type 2 diabetes mellitus without complications: Secondary | ICD-10-CM | POA: Insufficient documentation

## 2022-02-01 DIAGNOSIS — M25712 Osteophyte, left shoulder: Secondary | ICD-10-CM | POA: Diagnosis not present

## 2022-02-01 DIAGNOSIS — M19012 Primary osteoarthritis, left shoulder: Secondary | ICD-10-CM | POA: Diagnosis not present

## 2022-02-01 MED ORDER — ACETAMINOPHEN 500 MG PO TABS
1000.0000 mg | ORAL_TABLET | Freq: Once | ORAL | Status: AC
  Administered 2022-02-01: 1000 mg via ORAL
  Filled 2022-02-01: qty 2

## 2022-02-01 MED ORDER — MELOXICAM 15 MG PO TABS: 15.0000 mg | ORAL_TABLET | Freq: Every day | ORAL | 0 refills | Status: DC

## 2022-02-01 NOTE — ED Provider Triage Note (Signed)
Emergency Medicine Provider Triage Evaluation Note  Loy Little , a 61 y.o. female  was evaluated in triage.  Pt complains of left shoulder pain.  States pain started in her left arm 2 weeks ago, but has radiated superiorly to the glenohumeral joints and shoulder blade.  States shoulder joint and shoulder blade pain began this morning.  Rates it an 8 out of 10.  States that his an intense aching pain.  Has not tried any pain medications at home.  States it is difficult to raise her arm.  States she has had numbness and tingling in bilateral hands in the past, however does not have this right now.  No weakness.  Review of Systems  Positive: As above Negative: Chest pain, shortness of breath, numbness, tingling, weakness  Physical Exam  BP 115/79 (BP Location: Right Arm)   Pulse 84   Temp 98.3 F (36.8 C) (Oral)   Resp 20   Ht 5' 2.5" (1.588 m)   Wt 66.2 kg   SpO2 98%   BMI 26.28 kg/m  Gen:   Awake, no distress   Resp:  Normal effort  MSK:   Moves extremities without difficulty  Other:  Sensation intact.  Strength 5 out of 5 bilaterally  Medical Decision Making  Medically screening exam initiated at 11:33 AM.  Appropriate orders placed.  Jaeley Wiker was informed that the remainder of the evaluation will be completed by another provider, this initial triage assessment does not replace that evaluation, and the importance of remaining in the ED until their evaluation is complete.     Roylene Reason, [place] 02/01/22 1135

## 2022-02-01 NOTE — ED Provider Notes (Signed)
[facility]MENT Provider Note   CSN: [id number] Arrival date & time: 02/01/22  1024     History  Chief Complaint  Patient presents with   Shoulder Pain    Julie Dawson is a 61 y.o. female.  Patient presents to the hospital complaining of left-sided shoulder pain.  Pain reportedly started 2 weeks ago.  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  She denies any known injury at onset.  Complains of pain in the left shoulder with flexion, abduction.  Also complains of pain in the shoulder blade which is worse with movement.  Patient has not taken any medications for her pain at home.  She denies weakness at this time.  Past medical history significant for chronic bilateral low back pain without sciatica, type II DM without complication, hyperlipidemia  HPI     Home Medications Prior to Admission medications   Medication Sig Start Date End Date Taking? Authorizing Provider  meloxicam (MOBIC) 15 MG tablet Take 1 tablet (15 mg total) by mouth daily. 02/01/22 03/03/22 Yes Daizy Outen, Peyton Najjar B, PA-C  atorvastatin (LIPITOR) 20 MG tablet TAKE 1 TABLET BY MOUTH ONCE DAILY AT  6PM 01/11/22   Olive Bass, FNP  sitaGLIPtin (JANUVIA) 100 MG tablet Take 1 tablet (100 mg total) by mouth daily. Patient not taking: Reported on 12/21/2021 11/18/21   Olive Bass, FNP      Allergies    Ace inhibitors, Jardiance [empagliflozin], Lisinopril, and Metformin and related    Review of Systems   Review of Systems  Musculoskeletal:  Positive for arthralgias.  Neurological:  Negative for weakness and numbness.    Physical Exam Updated Vital Signs BP (!) 146/74   Pulse 91   Temp 98.6 F (37 C) (Oral)   Resp 18   Ht 5' 2.5" (1.588 m)   Wt 66.2 kg   SpO2 100%   BMI 26.28 kg/m  Physical Exam Vitals and nursing note reviewed.  Constitutional:       General: She is not in acute distress.    Appearance: She is normal weight.  HENT:     Head: Normocephalic and atraumatic.     Mouth/Throat:     Mouth: Mucous membranes are moist.  Eyes:     Conjunctiva/sclera: Conjunctivae normal.  Cardiovascular:     Rate and Rhythm: Normal rate.  Pulmonary:     Effort: Pulmonary effort is normal. No respiratory distress.  Abdominal:     General: Abdomen is flat.  Musculoskeletal:        General: Tenderness present. No swelling, deformity or signs of injury.     Cervical back: Normal range of motion.     Comments: Patient with pain with left shoulder abduction, internal rotation, external rotation, flexion, extension.  Generalized tenderness to the left deltoid region.  Tenderness to left scapula near the inferior medial border.  No swelling noted.  Skin:    General: Skin is dry.     Capillary Refill: Capillary refill takes less than 2 seconds.  Neurological:     Mental Status: She is alert.  Psychiatric:        Speech: Speech normal.        Behavior: Behavior normal.     ED Results / Procedures / Treatments   Labs (all labs ordered are listed, but only abnormal results are displayed) Labs Reviewed - No data to  display  EKG None  Radiology DG Shoulder Left  Result Date: 02/01/2022 CLINICAL DATA:  Left shoulder pain for 1 month. History of COVID about 1 month ago. EXAM: LEFT SHOULDER - 2+ VIEW COMPARISON:  Left shoulder radiographs 07/15/2015 FINDINGS: Minimal glenohumeral joint space narrowing. Mild glenoid articular surface degenerative irregularity and mild peripheral degenerative osteophytes. Mild acromioclavicular joint space narrowing and peripheral osteophytosis. Mild distal lateral subacromial spurring. No acute fracture is seen. No dislocation. The visualized portion of the left lung is unremarkable. IMPRESSION: 1. Mild glenohumeral and acromioclavicular osteoarthritis. 2. Mild distal lateral subacromial spurring. Electronically  Signed   By: Yvonne Kendall M.D.   On: 02/01/2022 12:31    Procedures Procedures    Medications Ordered in ED Medications  acetaminophen (TYLENOL) tablet 1,000 mg (1,000 mg Oral Given 02/01/22 1548)    ED Course/ Medical Decision Making/ A&P                           Medical Decision Making  Patient presents with left-sided shoulder pain.  Differential diagnosis includes but is not limited to osteoarthritis, rotator cuff injury, fracture, dislocation, other soft tissue injury, and others  I reviewed the patient's past medical history including notes from the end of August showing progress from primary care with treatment for type II DM.  There is no indication at this time for lab work.  I ordered and personally interpreted imaging including plain films of the left shoulder. 1. Mild glenohumeral and acromioclavicular osteoarthritis.  2. Mild distal lateral subacromial spurring.  I agree with the radiologist findings  I ordered the patient Tylenol for pain. Upon reassessment the patient had slightly improved.   The patient's pain is reproducible with movement.  No fracture or dislocation noted on imaging.  Mild arthritis noted.  Unclear if this is the underlying cause of her pain or not.  Pain definitely seems musculoskeletal in nature.  Plan to discharge patient have her follow-up with orthopedics.  Patient states he feels better R arm pain to the left side so I would not order a sling at this time.  Plan to prescribe meloxicam.  Discharge home       Final Clinical Impression(s) / ED Diagnoses Final diagnoses:  Acute pain of left shoulder    Rx / DC Orders ED Discharge Orders          Ordered    meloxicam (MOBIC) 15 MG tablet  Daily        02/01/22 1538              Ronny Bacon 02/01/22 1557    Davonna Belling, MD 02/01/22 2307

## 2022-02-01 NOTE — Discharge Instructions (Signed)
You were seen today for left sided shoulder pain. Your x-rays show mild arthritis but no fracture or dislocation. I recommend follow up with orthopedics. I have prescribed an antiinflammatory called Meloxicam. Do not take other NSAID medications while taking the Meloxicam. Contact the listed number to schedule your follow up appointment. Work note is attached.

## 2022-02-01 NOTE — ED Triage Notes (Signed)
Pt. Stated, My hands are red and not suppose to be and my left underarn is hurting and my back were the shoulder is.

## 2022-02-02 ENCOUNTER — Encounter: Payer: Self-pay | Admitting: Family

## 2022-02-11 ENCOUNTER — Telehealth: Payer: Self-pay

## 2022-02-11 ENCOUNTER — Encounter: Payer: Self-pay | Admitting: Family

## 2022-02-11 ENCOUNTER — Ambulatory Visit: Payer: BC Managed Care – PPO | Admitting: Family

## 2022-02-11 NOTE — Telephone Encounter (Signed)
Pt showed up 40 min to the appointment and we had a full schedule. She has rescheduled until next week.

## 2022-02-17 ENCOUNTER — Other Ambulatory Visit: Payer: Self-pay | Admitting: Family

## 2022-02-17 ENCOUNTER — Ambulatory Visit: Payer: BC Managed Care – PPO | Admitting: Family

## 2022-02-17 ENCOUNTER — Encounter: Payer: Self-pay | Admitting: Family

## 2022-02-17 ENCOUNTER — Ambulatory Visit (INDEPENDENT_AMBULATORY_CARE_PROVIDER_SITE_OTHER)
Admission: RE | Admit: 2022-02-17 | Discharge: 2022-02-17 | Disposition: A | Discharge status: Home / Self Care | Payer: BC Managed Care – PPO | Source: Ambulatory Visit | Attending: Family | Admitting: Family

## 2022-02-17 VITALS — BP 124/60 | HR 84 | Temp 98.4°F | Ht 62.0 in | Wt 151.0 lb

## 2022-02-17 DIAGNOSIS — M50321 Other cervical disc degeneration at C4-C5 level: Secondary | ICD-10-CM | POA: Diagnosis not present

## 2022-02-17 DIAGNOSIS — M50322 Other cervical disc degeneration at C5-C6 level: Secondary | ICD-10-CM | POA: Diagnosis not present

## 2022-02-17 DIAGNOSIS — M50323 Other cervical disc degeneration at C6-C7 level: Secondary | ICD-10-CM | POA: Diagnosis not present

## 2022-02-17 DIAGNOSIS — M542 Cervicalgia: Secondary | ICD-10-CM | POA: Diagnosis not present

## 2022-02-17 DIAGNOSIS — E119 Type 2 diabetes mellitus without complications: Secondary | ICD-10-CM

## 2022-02-17 LAB — COMPREHENSIVE METABOLIC PANEL
ALT: 21 U/L (ref 0–35)
AST: 15 U/L (ref 0–37)
Albumin: 4.5 g/dL (ref 3.5–5.2)
Alkaline Phosphatase: 97 U/L (ref 39–117)
BUN: 17 mg/dL (ref 6–23)
CO2: 29 mEq/L (ref 19–32)
Calcium: 9.4 mg/dL (ref 8.4–10.5)
Chloride: 105 mEq/L (ref 96–112)
Creatinine, Ser: 1.08 mg/dL (ref 0.40–1.20)
GFR: 55.62 mL/min — ABNORMAL LOW (ref >60.00)
Glucose, Bld: 146 mg/dL — ABNORMAL HIGH (ref 70–99)
Potassium: 3.8 mEq/L (ref 3.5–5.1)
Sodium: 141 mEq/L (ref 135–145)
Total Bilirubin: 0.6 mg/dL (ref 0.2–1.2)
Total Protein: 7 g/dL (ref 6.0–8.3)

## 2022-02-17 LAB — CBC WITH DIFFERENTIAL/PLATELET
Basophils Absolute: 0 10*3/uL (ref 0.0–0.1)
Basophils Relative: 0.5 % (ref 0.0–3.0)
Eosinophils Absolute: 0.1 10*3/uL (ref 0.0–0.7)
Eosinophils Relative: 1 % (ref 0.0–5.0)
HCT: 40.1 % (ref 36.0–46.0)
Hemoglobin: 13.2 g/dL (ref 12.0–15.0)
Lymphocytes Relative: 39.9 % (ref 12.0–46.0)
Lymphs Abs: 3.5 10*3/uL (ref 0.7–4.0)
MCHC: 32.9 g/dL (ref 30.0–36.0)
MCV: 83.4 fl (ref 78.0–100.0)
Monocytes Absolute: 0.4 10*3/uL (ref 0.1–1.0)
Monocytes Relative: 5.2 % (ref 3.0–12.0)
Neutro Abs: 4.6 10*3/uL (ref 1.4–7.7)
Neutrophils Relative %: 53.4 % (ref 43.0–77.0)
Platelets: 233 10*3/uL (ref 150.0–400.0)
RBC: 4.81 Mil/uL (ref 3.87–5.11)
RDW: 14.1 % (ref 11.5–15.5)
WBC: 8.7 10*3/uL (ref 4.0–10.5)

## 2022-02-17 LAB — HEMOGLOBIN A1C: Hgb A1c MFr Bld: 7 % — ABNORMAL HIGH (ref 4.6–6.5)

## 2022-02-17 NOTE — Progress Notes (Signed)
Julie Dawson is a 61 y.o. 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    Allergies: Ace inhibitors, Jardiance [empagliflozin], Lisinopril, and Metformin and related  Past Medical History:  Diagnosis Date   Allergy    spring time only    Constipation    doesnt use otc medicines   Diabetes mellitus without complication ([place])    History of positive PPD    cannot do the tine test,  has to do CXR   Hyperlipidemia     Past Surgical History:  Procedure Laterality Date   cervical adenitis node remova     ECTOPIC PREGNANCY SURGERY     MOUTH SURGERY     for wisdom teeth removal    Family History  Problem Relation Age of Onset   Diabetes Mother    Hypertension Mother    COPD Mother    Stroke Father    Diabetes Father    Hypertension Father    Diabetes Sister    Alcohol abuse Sister    Drug abuse Brother    Colon cancer Neg  Hx    Colon polyps Neg Hx    Esophageal cancer Neg Hx    Rectal cancer Neg Hx    Stomach cancer Neg Hx    Breast cancer Neg Hx     Social History   Tobacco Use   Smoking status: Every Day    Packs/day: 1.00    Types: Cigarettes   Smokeless tobacco: Never  Substance Use Topics   Alcohol use: No    Subjective:  Needs updated labs for type 2 Diabetes- not currently taking medication; is taking Lipitor as prescribed;  Went to ER a few weeks ago with left shoulder/ upper arm pain; was recommend ortho referral; patient is concerned that pain is not in her shoulder and she was mis-diagnosed; feels pain coming from her upper neck/ shoulder down into upper arm; drives school bus for [employer];      Objective:  Vitals:   02/17/22 1045  BP: 124/60  Pulse: 84  Temp: 98.4 F (36.9 C)  TempSrc: Oral  SpO2: 99%  Weight: 151 lb (68.5 kg)  Height: 5' 2"  (1.575 m)    General: Well developed, well nourished, in no acute distress  Skin : Warm and dry.  Head: Normocephalic and atraumatic  Lungs: Respirations unlabored; clear to auscultation bilaterally without wheeze, rales, rhonchi  CVS exam: normal rate and regular rhythm.  Abdomen: Soft;  nontender; nondistended; normoactive bowel sounds; no masses or hepatosplenomegaly  Musculoskeletal: No deformities; no active joint inflammation  Extremities: No edema, cyanosis, clubbing  Vessels: Symmetric bilaterally  Neurologic: Alert and oriented; speech intact; face symmetrical; moves all extremities well; CNII-XII intact without focal deficit   Assessment:  1. Type 2 diabetes mellitus without complication, without long-term current use of insulin ([place])   2. Neck pain     Plan:  Update labs today; to consider re-starting Januvia; follow up to be determined; Suspect upper arm pain is actually related to neck- not shoulder; will update cervical X-ray; follow up to be determined; she defers medication; work note given as requested;    No follow-ups on file.  Orders Placed This Encounter  Procedures   DG Cervical Spine Complete    Standing Status:   Future    Number of Occurrences:   1    Standing Expiration Date:   02/18/2023    Order Specific Question:   Reason for Exam (SYMPTOM  OR DIAGNOSIS REQUIRED)    Answer:   neck pain    Order Specific Question:   Preferred imaging location?    Answer:   [address]   CBC with Differential/Platelet   Comp Met (CMET)   Hemoglobin A1c    Requested Prescriptions    No prescriptions requested or ordered in this encounter

## 2022-02-18 ENCOUNTER — Encounter: Payer: Self-pay | Admitting: Family

## 2022-02-21 ENCOUNTER — Other Ambulatory Visit: Payer: Self-pay | Admitting: Family

## 2022-02-21 ENCOUNTER — Encounter: Payer: Self-pay | Admitting: Family

## 2022-02-21 DIAGNOSIS — M542 Cervicalgia: Secondary | ICD-10-CM

## 2022-02-21 DIAGNOSIS — M79622 Pain in left upper arm: Secondary | ICD-10-CM

## 2022-02-22 ENCOUNTER — Encounter: Payer: Self-pay | Admitting: Orthopedic Surgery

## 2022-02-22 ENCOUNTER — Ambulatory Visit (INDEPENDENT_AMBULATORY_CARE_PROVIDER_SITE_OTHER): Payer: BC Managed Care – PPO

## 2022-02-22 ENCOUNTER — Ambulatory Visit: Payer: BC Managed Care – PPO | Admitting: Orthopedic Surgery

## 2022-02-22 ENCOUNTER — Other Ambulatory Visit: Payer: Self-pay

## 2022-02-22 VITALS — BP 132/84 | HR 88 | Ht 62.0 in | Wt 151.0 lb

## 2022-02-22 DIAGNOSIS — M542 Cervicalgia: Secondary | ICD-10-CM | POA: Diagnosis not present

## 2022-02-22 DIAGNOSIS — R109 Unspecified abdominal pain: Secondary | ICD-10-CM | POA: Diagnosis not present

## 2022-02-22 DIAGNOSIS — F17210 Nicotine dependence, cigarettes, uncomplicated: Secondary | ICD-10-CM | POA: Insufficient documentation

## 2022-02-22 DIAGNOSIS — M5412 Radiculopathy, cervical region: Secondary | ICD-10-CM | POA: Diagnosis not present

## 2022-02-22 DIAGNOSIS — E119 Type 2 diabetes mellitus without complications: Secondary | ICD-10-CM | POA: Diagnosis not present

## 2022-02-22 DIAGNOSIS — R1031 Right lower quadrant pain: Secondary | ICD-10-CM | POA: Diagnosis not present

## 2022-02-22 NOTE — Progress Notes (Signed)
Orthopedic Spine Surgery Office Note  Assessment: Patient is a 61 y.o. female with symptoms of pain that radiate into her shoulder and lateral arm. She also has numbness and paresthesias in a C6 distribution. She has degenerative changes at C4/5 and C5/6. I suspect her symptoms are due to either a C5 or C6 radiculopathy   Plan: -Explained that initially conservative treatment is tried as a significant number of patients may experience relief with these treatment modalities. Discussed that the conservative treatments include:  -activity modification  -physical therapy  -over the counter pain medications  -medrol dosepak  -cervical steroid injections -Patient has tried naproxen and activity modification  -Recommended she keep using the naproxen that was previously prescribed and try physical therapy. A referral was provided to her for PT. -Encouraged smoking cessation. Explained that no spine surgery would be considered until she is no longer using any nicotine-containing product.  -Discussed that if her symptoms fail to improve with naproxen and PT, will order an MRI to evaluate for cervical radiculopathy -Patient should return to office in 5 weeks, repeat x-rays of cervical spine at next visit: none   Patient expressed understanding of the plan and all questions were answered to the patient's satisfaction.   ___________________________________________________________________________   History:  Patient is a 61 y.o. female who presents today for cervical spine.  I will call the patient presents with about 1 month of neck and left arm pain.  Patient states there is no trauma or injury that brought the pain.  Pain starts in her neck and radiates down the left side of her shoulder.  It goes to the lateral aspect of the arm.  She also has noticed left distal forearm and hand numbness and tingling.  The numbness and tingling is felt mostly in the left thumb and index finger.  She has not noticed  any symptoms on the right arm.  Pain has gotten a little bit better since starting the naproxen.  However, she has noted onset of right sided truncal pain as she feels she is compensating when sitting due to her left arm and shoulder pain. No radiating pain into her legs.    Weakness: Denies Difficulty with fine motor skills (e.g., buttoning shirts, handwriting): Denies Symptoms of imbalance: Denies Paresthesias and numbness: yes, in a C6 distribution Bowel or bladder incontinence: Denies Saddle anesthesia: Denies  Treatments tried: Activity modification, naproxen  Review of systems: Denies fevers and chills, night sweats, unexplained weight loss, history of cancer.  Pain is waking her at night on occasion  Past medical history: Diabetes  Allergies: ACE inhibitors, lisinopril, metformin, jardiance  Past surgical history:  Ectopic pregnancy surgery Wisdom tooth extraction  Social history: Reports use of nicotine product (smoking, vaping, patches, smokeless) - 1ppd Alcohol use: Denies Denies recreational drug use  Physical Exam:  General: no acute distress, appears stated age Neurologic: alert, answering questions appropriately, following commands Respiratory: unlabored breathing on room air, symmetric chest rise Psychiatric: appropriate affect, normal cadence to speech   MSK (spine):  -Strength exam      Left  Right Grip strength                5/5  5/5 Interosseus   5/5   5/5 Wrist extension  5/5  5/5 Wrist flexion   5/5  5/5 Elbow flexion   5/5  5/5 Deltoid    5/5  5/5  EHL    5/5  5/5 TA    5/5  5/5 GSC  5/5  5/5 Knee extension  5/5  5/5 Hip flexion   5/5  5/5  -Sensory exam    Sensation intact to light touch in L3-S1 nerve distributions of bilateral lower extremities  Sensation intact to light touch in C5-T1 nerve distributions of bilateral upper extremities  -Brachioradialis DTR: 2/4 on the left, 2/4 on the right -Biceps DTR: 2/4 on the left, 2/4 on  the right -Achilles DTR: 2/4 on the left, 2/4 on the right -Patellar tendon DTR: 1/4 on the left, 1/4 on the right  -Spurling: positive on the left, negative on the right -Hoffman sign: negative bilaterally -Clonus: no beats bilaterally -Interosseous wasting: none seen -Grip and release test: negative  -Romberg: negative  -Gait: normal -Imbalance with tandem gait: no  Left shoulder exam: some pain with internal rotation but no pain through remainder of range of motion, positive hawkins, negative jobe, negative drop arm sign, negative belly press, negative lift off Right shoulder exam: No pain through range of motion, negative Hawkins, negative Jobe, negative drop arm sign, negative belly press, negative liftoff  Tinel's at wrist: Negative bilaterally Phalen's at wrist: Negative bilaterally Durkan's: Negative bilaterally  Tinel's at elbow: Negative bilaterally Phalen's at elbow: Negative bilaterally  Imaging: XR of the cervical spine from 02/17/2022 and 02/22/2022 was independently reviewed and interpreted, showing disc height loss and anterior osteophyte formation at C4/5 and C5/6. Less significant degenerative change seen at C6/7. No fracture of dislocation. No evidence of instability on flexion/extension.    Patient name: Julie Dawson Patient MRN: [id number] Date of visit: 02/22/22

## 2022-02-23 ENCOUNTER — Emergency Department (HOSPITAL_BASED_OUTPATIENT_CLINIC_OR_DEPARTMENT_OTHER)
Admission: EM | Admit: 2022-02-23 | Discharge: 2022-02-23 | Disposition: A | Discharge status: Home / Self Care | Payer: BC Managed Care – PPO | Attending: Emergency Medicine | Admitting: Emergency Medicine

## 2022-02-23 ENCOUNTER — Emergency Department (HOSPITAL_BASED_OUTPATIENT_CLINIC_OR_DEPARTMENT_OTHER): Payer: BC Managed Care – PPO

## 2022-02-23 ENCOUNTER — Encounter (HOSPITAL_BASED_OUTPATIENT_CLINIC_OR_DEPARTMENT_OTHER): Payer: Self-pay

## 2022-02-23 ENCOUNTER — Other Ambulatory Visit: Payer: Self-pay

## 2022-02-23 DIAGNOSIS — N281 Cyst of kidney, acquired: Secondary | ICD-10-CM | POA: Diagnosis not present

## 2022-02-23 DIAGNOSIS — R109 Unspecified abdominal pain: Secondary | ICD-10-CM | POA: Diagnosis not present

## 2022-02-23 DIAGNOSIS — N200 Calculus of kidney: Secondary | ICD-10-CM | POA: Diagnosis not present

## 2022-02-23 LAB — COMPREHENSIVE METABOLIC PANEL
ALT: 12 U/L (ref 0–44)
AST: 13 U/L — ABNORMAL LOW (ref 15–41)
Albumin: 4.4 g/dL (ref 3.5–5.0)
Alkaline Phosphatase: 79 U/L (ref 38–126)
Anion gap: 8 (ref 5–15)
BUN: 18 mg/dL (ref 8–23)
CO2: 26 mmol/L (ref 22–32)
Calcium: 9.1 mg/dL (ref 8.9–10.3)
Chloride: 105 mmol/L (ref 98–111)
Creatinine, Ser: 1.32 mg/dL — ABNORMAL HIGH (ref 0.44–1.00)
GFR, Estimated: 46 mL/min — ABNORMAL LOW (ref >60)
Glucose, Bld: 99 mg/dL (ref 70–99)
Potassium: 3.9 mmol/L (ref 3.5–5.1)
Sodium: 139 mmol/L (ref 135–145)
Total Bilirubin: 0.6 mg/dL (ref 0.3–1.2)
Total Protein: 7.2 g/dL (ref 6.5–8.1)

## 2022-02-23 LAB — CBC
HCT: 39.2 % (ref 36.0–46.0)
Hemoglobin: 13 g/dL (ref 12.0–15.0)
MCH: 27.5 pg (ref 26.0–34.0)
MCHC: 33.2 g/dL (ref 30.0–36.0)
MCV: 82.9 fL (ref 80.0–100.0)
Platelets: 241 10*3/uL (ref 150–400)
RBC: 4.73 MIL/uL (ref 3.87–5.11)
RDW: 13.9 % (ref 11.5–15.5)
WBC: 9.7 10*3/uL (ref 4.0–10.5)
nRBC: 0 % (ref 0.0–0.2)

## 2022-02-23 LAB — URINALYSIS, ROUTINE W REFLEX MICROSCOPIC
Bilirubin Urine: NEGATIVE
Glucose, UA: NEGATIVE mg/dL
Hgb urine dipstick: NEGATIVE
Ketones, ur: NEGATIVE mg/dL
Leukocytes,Ua: NEGATIVE
Nitrite: NEGATIVE
Protein, ur: NEGATIVE mg/dL
Specific Gravity, Urine: 1.011 (ref 1.005–1.030)
pH: 6.5 (ref 5.0–8.0)

## 2022-02-23 LAB — LIPASE, BLOOD: Lipase: 34 U/L (ref 11–51)

## 2022-02-23 MED ORDER — SODIUM CHLORIDE 0.9 % IV BOLUS
500.0000 mL | Freq: Once | INTRAVENOUS | Status: AC
  Administered 2022-02-23: 500 mL via INTRAVENOUS

## 2022-02-23 MED ORDER — SODIUM CHLORIDE 0.9 % IV SOLN
2.0000 g | Freq: Once | INTRAVENOUS | Status: AC
  Administered 2022-02-23: 2 g via INTRAVENOUS
  Filled 2022-02-23: qty 20

## 2022-02-23 MED ORDER — CEPHALEXIN 500 MG PO CAPS: 500.0000 mg | ORAL_CAPSULE | Freq: Four times a day (QID) | ORAL | 0 refills | Status: AC

## 2022-02-23 MED ORDER — HYDROMORPHONE HCL 1 MG/ML IJ SOLN
0.5000 mg | Freq: Once | INTRAMUSCULAR | Status: AC
  Administered 2022-02-23: 0.5 mg via INTRAVENOUS
  Filled 2022-02-23: qty 1

## 2022-02-23 MED ORDER — KETOROLAC TROMETHAMINE 15 MG/ML IJ SOLN
15.0000 mg | Freq: Once | INTRAMUSCULAR | Status: AC
  Administered 2022-02-23: 15 mg via INTRAVENOUS
  Filled 2022-02-23: qty 1

## 2022-02-23 MED ORDER — NAPROXEN 500 MG PO TABS: 500.0000 mg | ORAL_TABLET | Freq: Two times a day (BID) | ORAL | 0 refills | Status: DC

## 2022-02-23 NOTE — Discharge Instructions (Addendum)
You were evaluated in the Emergency Department and after careful evaluation, we did not find any emergent condition requiring admission or further testing in the hospital.  Your exam/testing today is overall reassuring.  Symptoms may be due to a kidney infection.  Recommend taking the Keflex antibiotic as directed.  Use the Naprosyn anti-inflammatory twice daily for pain.  Please return to the Emergency Department if you experience any worsening of your condition.   Thank you for allowing [place] to be a part of your care.

## 2022-02-23 NOTE — ED Provider Notes (Signed)
[facility]ERGENCY [facility] Emergency Department Provider Note MRN:  [id number]  Arrival date & time: 02/23/22     Chief Complaint   Flank Pain   History of Present Illness   Julie Dawson is a 61 y.o. year-old female with a history of diabetes presenting to the ED with chief complaint of flank pain.  Right-sided flank pain for the past 3 days.  Was initially mild, thought maybe she aggravated her muscles from sleeping on the couch.  Has become much more severe, constant, occasional radiation into the right lower quadrant, suprapubic region.  No fever, no nausea vomiting or diarrhea, no constipation, no dysuria or hematuria.  No chest pain or shortness of breath.  Review of Systems  A thorough review of systems was obtained and all systems are negative except as noted in the HPI and PMH.   Patient's Health History    Past Medical History:  Diagnosis Date   Allergy    spring time only    Constipation    doesnt use otc medicines   Diabetes mellitus without complication (HCC)    History of positive PPD    cannot do the tine test,  has to do CXR   Hyperlipidemia     Past Surgical History:  Procedure Laterality Date   cervical adenitis node remova     ECTOPIC PREGNANCY SURGERY     MOUTH SURGERY     for wisdom teeth removal    Family History  Problem Relation Age of Onset   Diabetes Mother    Hypertension Mother    COPD Mother    Stroke Father    Diabetes Father    Hypertension Father    Diabetes Sister    Alcohol abuse Sister    Drug abuse Brother    Colon cancer Neg Hx    Colon polyps Neg Hx    Esophageal cancer Neg Hx    Rectal cancer Neg Hx    Stomach cancer Neg Hx    Breast cancer Neg Hx     Social History   Socioeconomic History   Marital status: Married    Spouse name: Not on file   Number of children: Not on file   Years of education: Not on file   Highest education level: Not on file  Occupational History   Not on file  Tobacco Use    Smoking status: Every Day    Packs/day: 1.00    Types: Cigarettes   Smokeless tobacco: Never  Vaping Use   Vaping Use: Never used  Substance and Sexual Activity   Alcohol use: No   Drug use: No    Comment: PT DENIES,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   Sexual activity: Yes    Birth control/protection: Post-menopausal  Other Topics Concern   Not on file  Social History Narrative   Not on file   Social Determinants of Health   Financial Resource Strain: Not on file  Food Insecurity: Not on file  Transportation Needs: Not on file  Physical Activity: Not on file  Stress: Not on file  Social Connections: Not on file  Intimate Partner Violence: Not on file     Physical Exam   Vitals:   02/23/22 0004 02/23/22 0115  BP: (!) 141/76 (!) 142/82  Pulse: 80 83  Resp: 16 16  Temp: 97.7 F (36.5 C)   SpO2: 100% 97%    CONSTITUTIONAL: Well-appearing, NAD NEURO/PSYCH:  Alert and oriented x 3, no focal deficits EYES:  eyes equal and  reactive ENT/NECK:  no LAD, no JVD CARDIO: Regular rate, well-perfused, normal S1 and S2 PULM:  CTAB no wheezing or rhonchi GI/GU:  non-distended, non-tender MSK/SPINE:  No gross deformities, no edema SKIN:  no rash, atraumatic   *Additional and/or pertinent findings included in MDM below  Diagnostic and Interventional Summary    EKG Interpretation  Date/Time:  Wednesday February 23 2022 00:26:41 EDT Ventricular Rate:  86 PR Interval:  142 QRS Duration: 76 QT Interval:  382 QTC Calculation: 457 R Axis:   64 Text Interpretation: Normal sinus rhythm Nonspecific T wave abnormality Abnormal ECG When compared with ECG of 01-Feb-2022 10:29, No significant change was found Confirmed by Gerlene Fee [id number]) on 02/23/2022 1:39:26 AM       Labs Reviewed  COMPREHENSIVE METABOLIC PANEL - Abnormal; Notable for the following components:      Result Value   Creatinine, Ser 1.32 (*)    AST 13 (*)    GFR, Estimated 46 (*)    All other components within  normal limits  URINALYSIS, ROUTINE W REFLEX MICROSCOPIC - Abnormal; Notable for the following components:   Color, Urine COLORLESS (*)    All other components within normal limits  URINE CULTURE  LIPASE, BLOOD  CBC    CT Renal Stone Study  Final Result      Medications  cefTRIAXone (ROCEPHIN) 2 g in sodium chloride 0.9 % 100 mL IVPB (2 g Intravenous New Bag/Given 02/23/22 0157)  HYDROmorphone (DILAUDID) injection 0.5 mg (0.5 mg Intravenous Given 02/23/22 0155)  ketorolac (TORADOL) 15 MG/ML injection 15 mg (15 mg Intravenous Given 02/23/22 0156)  sodium chloride 0.9 % bolus 500 mL (500 mLs Intravenous New Bag/Given 02/23/22 0155)     Procedures  /  Critical Care Procedures  ED Course and Medical Decision Making  Initial Impression and Ddx Differential diagnosis includes MSK, kidney stone, pyelonephritis, diverticulitis  Past medical/surgical history that increases complexity of ED encounter: Diabetes  Interpretation of Diagnostics I personally reviewed the laboratory assessment and my interpretation is as follows: Minimal elevation in creatinine compared to prior, otherwise no significant blood count or electrolyte disturbance, urinalysis normal.  CT is without evidence of kidney stones or emergent process, there is evidence of possible cystitis.  Patient Reassessment and Ultimate Disposition/Management     Given the evidence of cystitis on CT imaging in the amount of flank pain, pyelonephritis is still a suspicion.  According to the literature, up to 25% of pyelonephritis can have a normal urinalysis.  And so pyelonephritis is worth treating at this time until culture data returns.  MSK is the alternate diagnosis given that the pain is worse with movement and/or certain positions.  Anticipating discharge on antibiotics.  Patient management required discussion with the following services or consulting groups:  None  Complexity of Problems Addressed Acute illness or injury that  poses threat of life of bodily function  Additional Data Reviewed and Analyzed Further history obtained from: Further history from spouse/family member  Additional Factors Impacting ED Encounter Risk Use of parenteral controlled substances  Barth Kirks. [place]onia Small, [place] [email or web address]  Final Clinical Impressions(s) / ED Diagnoses     ICD-10-CM   1. Flank pain  R10.9       ED Discharge Orders          Ordered    naproxen (NAPROSYN) 500 MG tablet  2 times daily        02/23/22 0158    cephALEXin (KEFLEX) 500  MG capsule  4 times daily        02/23/22 0158             Discharge Instructions Discussed with and Provided to Patient:     Discharge Instructions      You were evaluated in the Emergency Department and after careful evaluation, we did not find any emergent condition requiring admission or further testing in the hospital.  Your exam/testing today is overall reassuring.  Symptoms may be due to a kidney infection.  Recommend taking the Keflex antibiotic as directed.  Use the Naprosyn anti-inflammatory twice daily for pain.  Please return to the Emergency Department if you experience any worsening of your condition.   Thank you for allowing [place] to be a part of your care.       Sabas Sous, MD 02/23/22 [phone]

## 2022-02-23 NOTE — ED Triage Notes (Signed)
Three day history for right flank pain with increased urinary frequency.  Denies N/V/D.

## 2022-02-24 ENCOUNTER — Encounter: Payer: Self-pay | Admitting: Family

## 2022-02-24 LAB — URINE CULTURE: Culture: <10000 — AB
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
## 2022-03-07 ENCOUNTER — Encounter: Payer: Self-pay | Admitting: Family

## 2022-03-08 ENCOUNTER — Other Ambulatory Visit: Payer: Self-pay | Admitting: Family

## 2022-03-08 DIAGNOSIS — N644 Mastodynia: Secondary | ICD-10-CM

## 2022-03-08 NOTE — Telephone Encounter (Signed)
Do you want pt to come in for a breast exam or can I go ahead and place mammogram

## 2022-03-10 ENCOUNTER — Ambulatory Visit: Payer: BC Managed Care – PPO | Admitting: Rehabilitative and Restorative Service Providers"

## 2022-03-13 ENCOUNTER — Encounter: Payer: Self-pay | Admitting: Family

## 2022-03-23 ENCOUNTER — Ambulatory Visit: Payer: Medicaid Other | Admitting: Orthopedic Surgery

## 2022-04-06 ENCOUNTER — Encounter: Payer: Self-pay | Admitting: Family

## 2022-04-06 ENCOUNTER — Ambulatory Visit (INDEPENDENT_AMBULATORY_CARE_PROVIDER_SITE_OTHER): Payer: BC Managed Care – PPO | Admitting: Physical Therapy

## 2022-04-06 DIAGNOSIS — M6281 Muscle weakness (generalized): Secondary | ICD-10-CM

## 2022-04-06 DIAGNOSIS — R293 Abnormal posture: Secondary | ICD-10-CM

## 2022-04-06 DIAGNOSIS — M542 Cervicalgia: Secondary | ICD-10-CM

## 2022-04-06 IMAGING — CT CT ABD-PELV W/ CM
2 of 5 series · 16 of 46 positions shown, 18 images · IV contrast (Omni 300)
Comparison: CT abdomen pelvis dated 02/25/2020.

CLINICAL DATA: 59-year-old female with abdominal pain. Concern for
acute diverticulitis.

EXAM:
CT ABDOMEN AND PELVIS WITH CONTRAST
TECHNIQUE: Multidetector CT imaging of the abdomen and pelvis was performed
using the standard protocol following bolus administration of
intravenous contrast.
CONTRAST:  100mL OMNIPAQUE IOHEXOL 300 MG/ML  SOLN

[Series 3: a/p w/ 5mm · axial · 0.70mm/px · z∈[+790,+1165]mm · 13 of 85 slices shown, 15 images]
[im 5/85  soft-tissue]
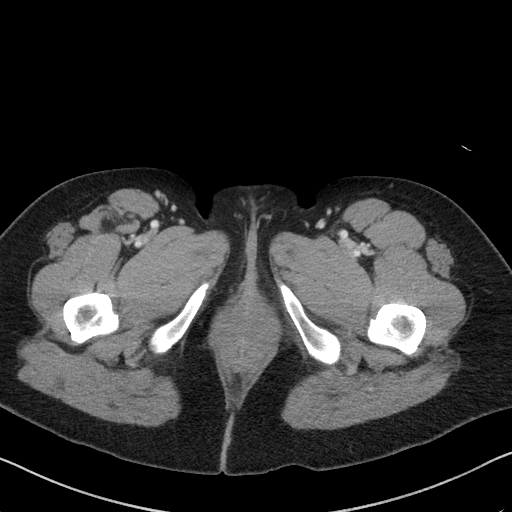
[im 5/85  bone]
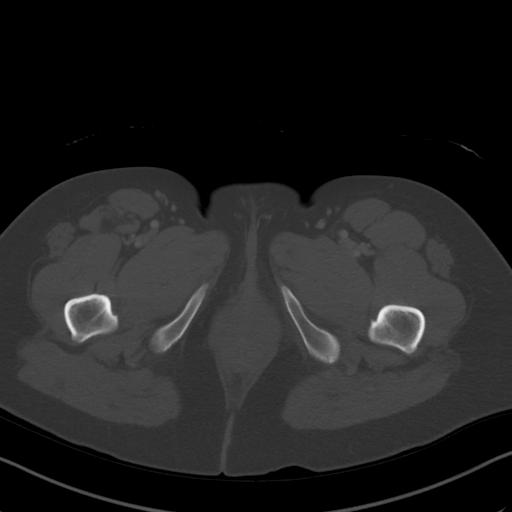
[im 14/85  soft-tissue]
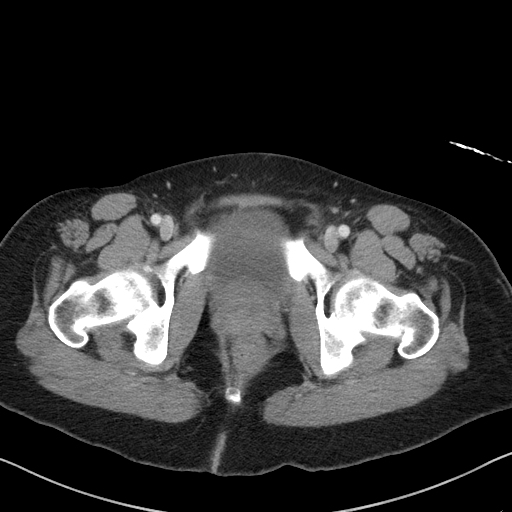
[im 18/85  soft-tissue]
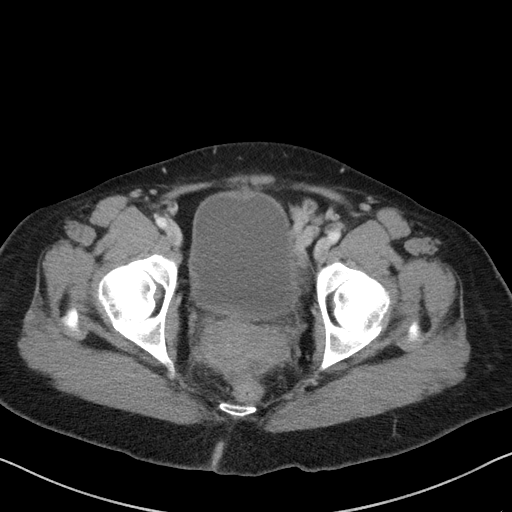
[im 23/85  soft-tissue]
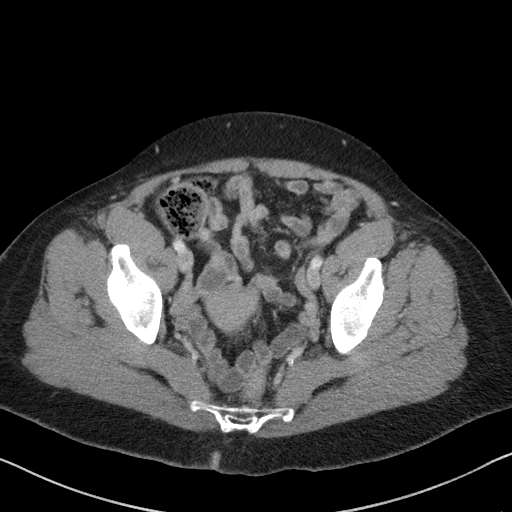
[im 31/85  soft-tissue]
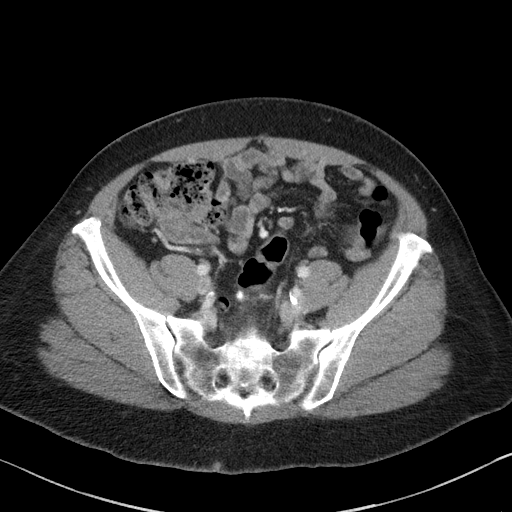
[im 36/85  soft-tissue]
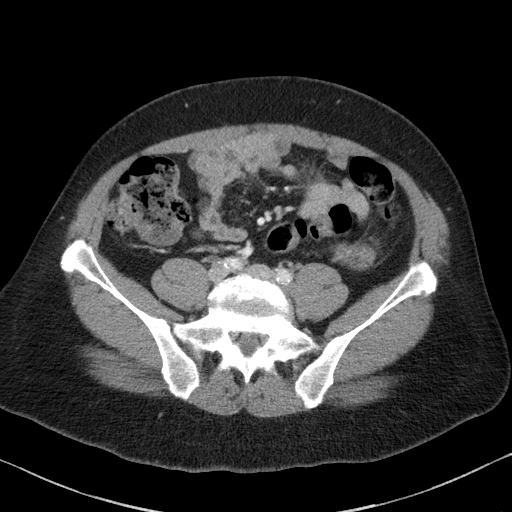
[im 45/85  soft-tissue]
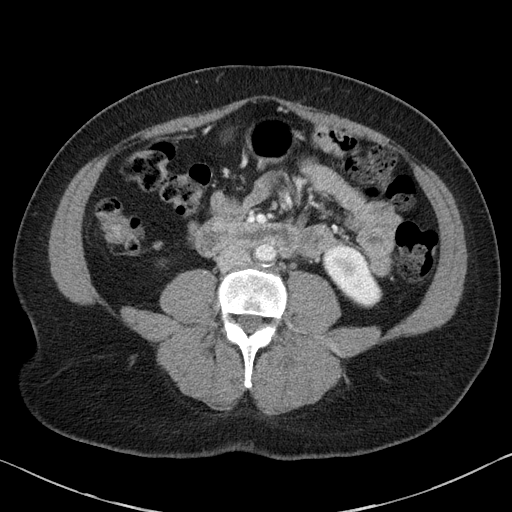
[im 49/85  soft-tissue]
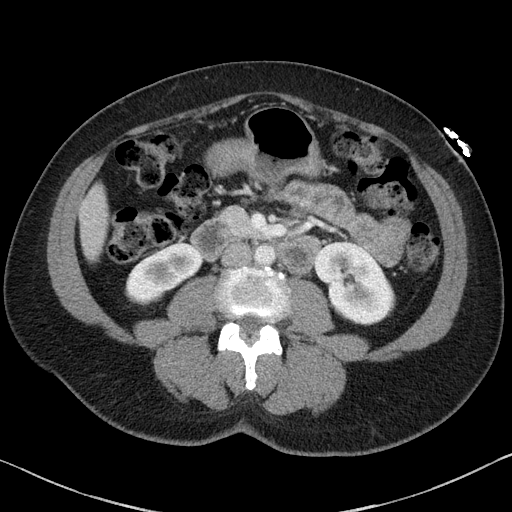
[im 54/85  soft-tissue]
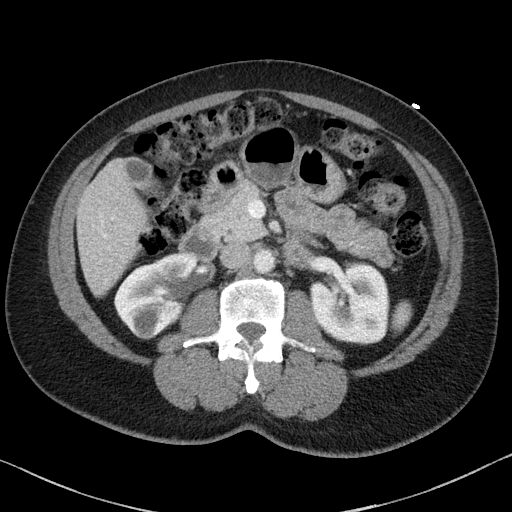
[im 54/85  bone]
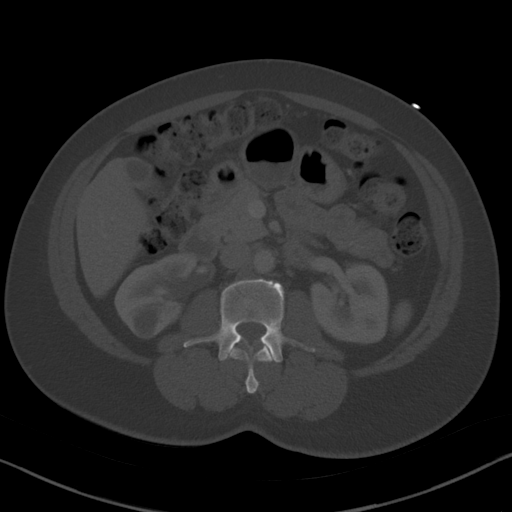
[im 62/85  soft-tissue]
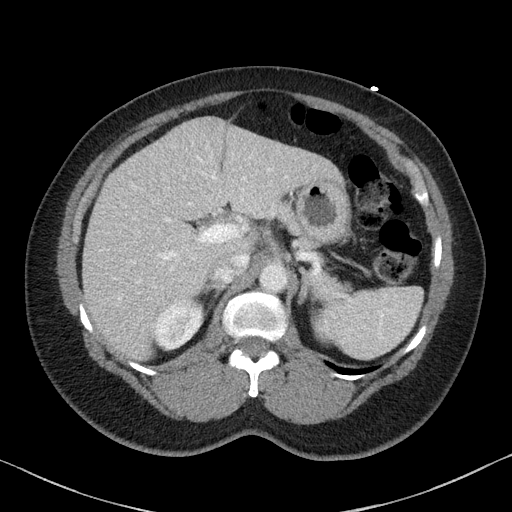
[im 67/85  soft-tissue]
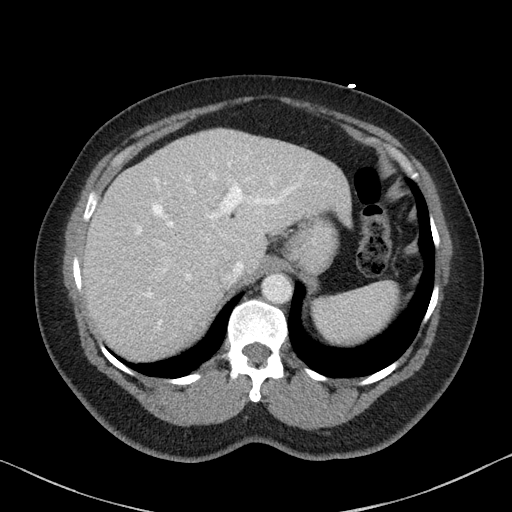
[im 71/85  soft-tissue]
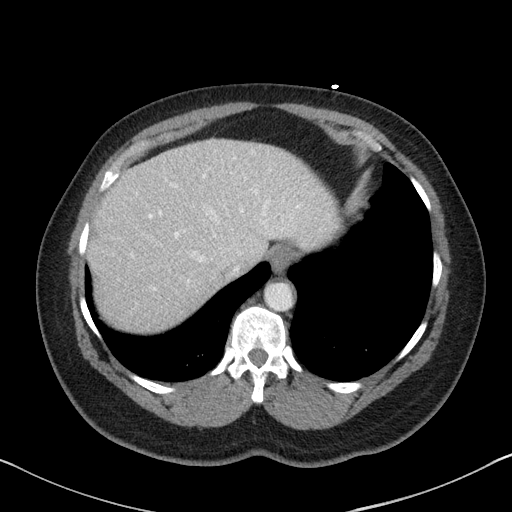
[im 80/85  soft-tissue]
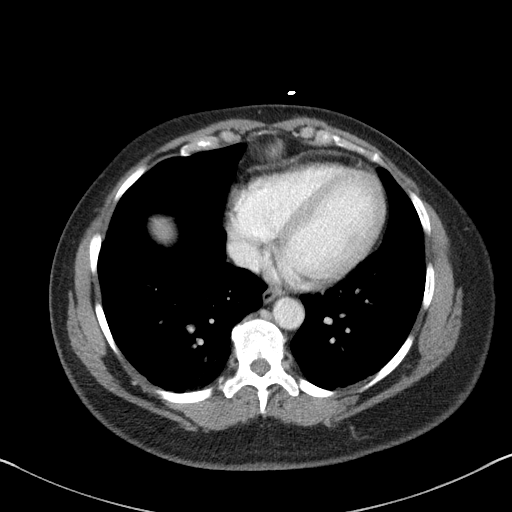

[Series 6: a/p w/ cor · coronal · 0.77mm/px · 3 of 150 slices shown]
[im 50/150  soft-tissue]
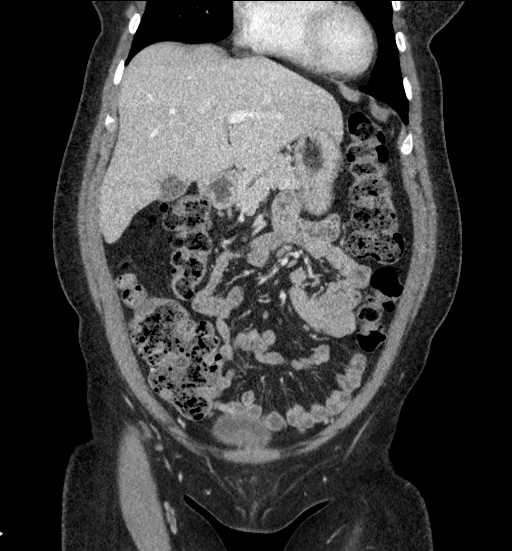
[im 67/150  soft-tissue]
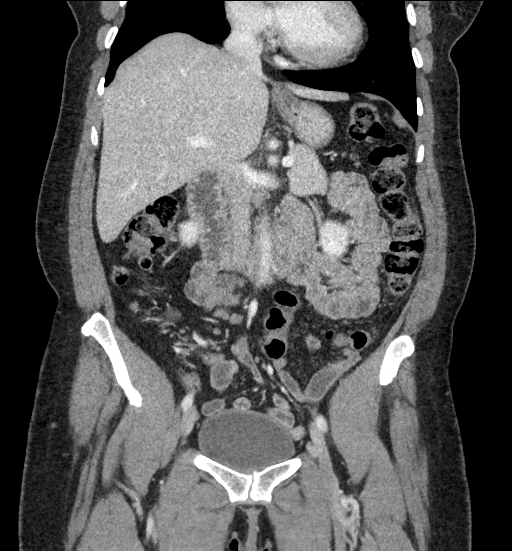
[im 83/150  soft-tissue]
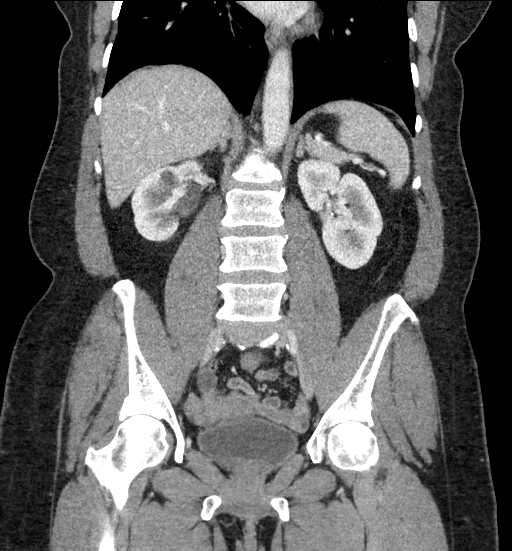

[16 of 46 positions shown; findings below may reference images not displayed]

FINDINGS: Lower chest: The visualized lung bases are clear.

No intra-abdominal free air or free fluid.

Hepatobiliary: No focal liver abnormality is seen. No gallstones,
gallbladder wall thickening, or biliary dilatation.

Pancreas: Unremarkable. No pancreatic ductal dilatation or
surrounding inflammatory changes.

Spleen: Normal in size without focal abnormality.

Adrenals/Urinary Tract: The adrenal glands unremarkable. There is
mild fullness of the right renal pelvis. There is no hydronephrosis
on either side. There is symmetric enhancement and excretion of
contrast by both kidneys. Small bilateral renal cysts and
subcentimeter hypodensities which are too small to characterize. The
visualized ureters and urinary bladder appear unremarkable.

Stomach/Bowel: There is moderate stool throughout the colon. There
are small scattered colonic diverticula without active inflammatory
changes. There is no bowel obstruction or active inflammation. The
appendix is normal.

Vascular/Lymphatic: Mild aortoiliac atherosclerotic disease. The IVC
is unremarkable. No portal venous gas. There is no adenopathy.

Reproductive: The uterus is anteverted and grossly unremarkable. No
adnexal masses.

Other: None

Musculoskeletal: Mild degenerative changes. No acute osseous
pathology.
IMPRESSION: 1. No acute intra-abdominal or pelvic pathology.
2. Moderate colonic stool burden. No bowel obstruction. Normal
appendix.
3. Aortic Atherosclerosis ([id number]).
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
## 2022-04-12 NOTE — Telephone Encounter (Signed)
They are suggesting that you prescribe the Gabapentin.

## 2022-04-19 ENCOUNTER — Encounter: Payer: Self-pay | Admitting: Physical Therapy

## 2022-04-19 ENCOUNTER — Ambulatory Visit (INDEPENDENT_AMBULATORY_CARE_PROVIDER_SITE_OTHER): Payer: BC Managed Care – PPO | Admitting: Physical Therapy

## 2022-04-19 DIAGNOSIS — M542 Cervicalgia: Secondary | ICD-10-CM

## 2022-04-19 DIAGNOSIS — R293 Abnormal posture: Secondary | ICD-10-CM | POA: Diagnosis not present

## 2022-04-19 DIAGNOSIS — M6281 Muscle weakness (generalized): Secondary | ICD-10-CM
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
## 2022-04-28 ENCOUNTER — Ambulatory Visit (INDEPENDENT_AMBULATORY_CARE_PROVIDER_SITE_OTHER): Payer: BC Managed Care – PPO | Admitting: Orthopedic Surgery

## 2022-04-28 ENCOUNTER — Encounter: Payer: BC Managed Care – PPO | Admitting: Rehabilitative and Restorative Service Providers"

## 2022-04-28 DIAGNOSIS — M5412 Radiculopathy, cervical region: Secondary | ICD-10-CM | POA: Diagnosis not present
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
## 2022-04-28 NOTE — Progress Notes (Signed)
Orthopedic Spine Surgery Office Note  Assessment: Patient is a 61 y.o. female with neck pain that radiated into her right shoulder. Had numbness and paresthesias in C6 distribution. Suspected a C5 or C6 radiculopathy. Symptoms have improved with conservative treatment   Plan: -Since her symptoms have improved, will not order the MRI -Told her to keep with her activity modification and sleep with no pillows under her head since that seemed to help -Continue with the home exercises recommended by PT -Patient has tried naproxen, PT, activity modification -Patient should return to office on an as needed basis   Patient expressed understanding of the plan and all questions were answered to the patient's satisfaction.   __________________________________________________________________________  History: Patient is a 61 y.o. female who has been previously seen in the office for symptoms consistent with cervical radiculopathy. Since the last visit, symptom intensity has become less severe. At the last visit, PT and naproxen was tried for treatment. Still has some pain radiating into right shoulder but it is much milder than it was when I first saw her. She also has decreased sensation and paresthesias in her thumb and ring finger. These symptoms have improved since the last time I saw her as well. She said that she does not think that the PT or naproxen helped. She said she prayed and was told to sleep without pillows so she now sleeps without any pillows under her head. She said as soon as she started doing that that her symptoms improved. Still no left sided symptoms.   Previous treatments: naproxen, PT, activity modification  COPY OF FIRST NOTE Patient is a 61 y.o. female who presents today for cervical spine.  I will call the patient presents with about 1 month of neck and left arm pain.  Patient states there is no trauma or injury that brought the pain.  Pain starts in her neck and radiates down  the left side of her shoulder.  It goes to the lateral aspect of the arm.  She also has noticed left distal forearm and hand numbness and tingling.  The numbness and tingling is felt mostly in the left thumb and index finger.  She has not noticed any symptoms on the right arm.  Pain has gotten a little bit better since starting the naproxen.  However, she has noted onset of right sided truncal pain as she feels she is compensating when sitting due to her left arm and shoulder pain. No radiating pain into her legs.      Weakness: Denies Difficulty with fine motor skills (e.g., buttoning shirts, handwriting): Denies Symptoms of imbalance: Denies Paresthesias and numbness: yes, in a C6 distribution Bowel or bladder incontinence: Denies Saddle anesthesia: Denies END OF COPY  Physical Exam:  General: no acute distress, appears stated age Neurologic: alert, answering questions appropriately, following commands Respiratory: unlabored breathing on room air, symmetric chest rise Psychiatric: appropriate affect, normal cadence to speech   MSK (spine):  -Strength exam      Left  Right Grip strength               5/5  5/5 Interosseus   5/5   5/5 Wrist extension  5/5  5/5 Wrist flexion   5/5  5/5 Elbow flexion   5/5  5/5 Deltoid    5/5  5/5  EHL    5/5  5/5 TA    5/5  5/5 GSC    5/5  5/5 Knee extension  5/5  5/5 Hip flexion  5/5  5/5  -Sensory exam    Sensation intact to light touch in L3-S1 nerve distributions of bilateral lower extremities  Sensation intact to light touch in C5-T1 nerve distributions of bilateral upper extremities  -Brachioradialis DTR: 2/4 on the left, 2/4 on the right -Biceps DTR: 2/4 on the left, 2/4 on the right -Achilles DTR: 2/4 on the left, 2/4 on the right -Patellar tendon DTR: 1/4 on the left, 1/4 on the right  -Spurling: negative bilaterally -Hoffman sign: negative bilaterally -Clonus: no beats bilaterally -Interosseous wasting: none seen -Grip and  release test: negative -Romberg: negative  -Gait: normal -Imbalance with tandem gait: no  Left shoulder exam: no pain through range of motion Right shoulder exam: no pain through range of motion  Tinel's at wrist: negative bilaterally Durkan's: positive on the right, negative on the left  Tinel's at elbow: negative bilaterally  Imaging: XR of the cervical spine from 02/17/2022 and 02/22/2022 was previously independently reviewed and interpreted, showing disc height loss and anterior osteophyte formation at C4/5 and C5/6. Less significant degenerative change seen at C6/7. No fracture of dislocation. No evidence of instability on flexion/extension.      Patient name: Julie Dawson Patient MRN: [id number] Date of visit: 04/28/22

## 2022-05-02 ENCOUNTER — Encounter: Payer: Self-pay | Admitting: Family

## 2022-05-04 ENCOUNTER — Other Ambulatory Visit: Payer: Self-pay | Admitting: Family

## 2022-05-04 ENCOUNTER — Encounter: Payer: BC Managed Care – PPO | Admitting: Physical Therapy

## 2022-05-04 DIAGNOSIS — N644 Mastodynia: Secondary | ICD-10-CM

## 2022-05-10 ENCOUNTER — Encounter: Payer: BC Managed Care – PPO | Admitting: Physical Therapy

## 2022-05-18 ENCOUNTER — Other Ambulatory Visit: Payer: Self-pay | Admitting: Family

## 2022-05-18 ENCOUNTER — Ambulatory Visit
Admission: RE | Admit: 2022-05-18 | Discharge: 2022-05-18 | Disposition: A | Discharge status: Home / Self Care | Payer: BC Managed Care – PPO | Source: Ambulatory Visit | Attending: Family | Admitting: Family

## 2022-05-18 DIAGNOSIS — N644 Mastodynia: Secondary | ICD-10-CM | POA: Diagnosis not present

## 2022-05-19 ENCOUNTER — Encounter: Payer: Self-pay | Admitting: Family

## 2022-05-20 ENCOUNTER — Other Ambulatory Visit: Payer: Self-pay | Admitting: Family

## 2022-05-20 DIAGNOSIS — E119 Type 2 diabetes mellitus without complications: Secondary | ICD-10-CM

## 2022-05-20 DIAGNOSIS — R200 Anesthesia of skin: Secondary | ICD-10-CM

## 2022-05-31 DIAGNOSIS — G5601 Carpal tunnel syndrome, right upper limb: Secondary | ICD-10-CM | POA: Diagnosis not present

## 2022-05-31 DIAGNOSIS — R200 Anesthesia of skin: Secondary | ICD-10-CM | POA: Diagnosis not present

## 2022-06-03 ENCOUNTER — Other Ambulatory Visit (HOSPITAL_COMMUNITY)
Admission: RE | Admit: 2022-06-03 | Discharge: 2022-06-03 | Disposition: A | Discharge status: Home / Self Care | Payer: BC Managed Care – PPO | Source: Ambulatory Visit | Attending: Family | Admitting: Family

## 2022-06-03 ENCOUNTER — Encounter: Payer: Self-pay | Admitting: Family

## 2022-06-03 ENCOUNTER — Ambulatory Visit (INDEPENDENT_AMBULATORY_CARE_PROVIDER_SITE_OTHER): Payer: BC Managed Care – PPO | Admitting: Family

## 2022-06-03 VITALS — BP 138/82 | HR 80 | Resp 18 | Ht 63.0 in | Wt 154.0 lb

## 2022-06-03 DIAGNOSIS — E118 Type 2 diabetes mellitus with unspecified complications: Secondary | ICD-10-CM | POA: Diagnosis not present

## 2022-06-03 DIAGNOSIS — Z23 Encounter for immunization: Secondary | ICD-10-CM

## 2022-06-03 DIAGNOSIS — Z1211 Encounter for screening for malignant neoplasm of colon: Secondary | ICD-10-CM

## 2022-06-03 DIAGNOSIS — Z124 Encounter for screening for malignant neoplasm of cervix: Secondary | ICD-10-CM

## 2022-06-03 LAB — COMPREHENSIVE METABOLIC PANEL
ALT: 13 U/L (ref 0–35)
AST: 12 U/L (ref 0–37)
Albumin: 4.4 g/dL (ref 3.5–5.2)
Alkaline Phosphatase: 94 U/L (ref 39–117)
BUN: 19 mg/dL (ref 6–23)
CO2: 28 mEq/L (ref 19–32)
Calcium: 9.1 mg/dL (ref 8.4–10.5)
Chloride: 106 mEq/L (ref 96–112)
Creatinine, Ser: 1.19 mg/dL (ref 0.40–1.20)
GFR: 49.41 mL/min — ABNORMAL LOW (ref >60.00)
Glucose, Bld: 129 mg/dL — ABNORMAL HIGH (ref 70–99)
Potassium: 4.2 mEq/L (ref 3.5–5.1)
Sodium: 142 mEq/L (ref 135–145)
Total Bilirubin: 0.5 mg/dL (ref 0.2–1.2)
Total Protein: 7 g/dL (ref 6.0–8.3)

## 2022-06-03 LAB — CBC WITH DIFFERENTIAL/PLATELET
Basophils Absolute: 0.1 10*3/uL (ref 0.0–0.1)
Basophils Relative: 0.7 % (ref 0.0–3.0)
Eosinophils Absolute: 0.1 10*3/uL (ref 0.0–0.7)
Eosinophils Relative: 1.6 % (ref 0.0–5.0)
HCT: 39.7 % (ref 36.0–46.0)
Hemoglobin: 13.4 g/dL (ref 12.0–15.0)
Lymphocytes Relative: 47.2 % — ABNORMAL HIGH (ref 12.0–46.0)
Lymphs Abs: 4.1 10*3/uL — ABNORMAL HIGH (ref 0.7–4.0)
MCHC: 33.9 g/dL (ref 30.0–36.0)
MCV: 83.1 fl (ref 78.0–100.0)
Monocytes Absolute: 0.6 10*3/uL (ref 0.1–1.0)
Monocytes Relative: 6.5 % (ref 3.0–12.0)
Neutro Abs: 3.8 10*3/uL (ref 1.4–7.7)
Neutrophils Relative %: 44 % (ref 43.0–77.0)
Platelets: 242 10*3/uL (ref 150.0–400.0)
RBC: 4.77 Mil/uL (ref 3.87–5.11)
RDW: 14.1 % (ref 11.5–15.5)
WBC: 8.6 10*3/uL (ref 4.0–10.5)

## 2022-06-03 LAB — HEMOGLOBIN A1C: Hgb A1c MFr Bld: 7 % — ABNORMAL HIGH (ref 4.6–6.5)

## 2022-06-03 MED ORDER — LANCETS MISC. MISC: 1.0000 | Freq: Three times a day (TID) | 0 refills | Status: AC

## 2022-06-03 MED ORDER — BLOOD GLUCOSE TEST VI STRP: 1.0000 | ORAL_STRIP | Freq: Three times a day (TID) | 0 refills | Status: AC

## 2022-06-03 MED ORDER — LANCET DEVICE MISC: 1.0000 | Freq: Three times a day (TID) | 0 refills | Status: AC

## 2022-06-03 MED ORDER — BLOOD GLUCOSE MONITORING SUPPL DEVI: 1.0000 | Freq: Three times a day (TID) | 0 refills | Status: DC
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
## 2022-06-04 ENCOUNTER — Other Ambulatory Visit: Payer: Self-pay | Admitting: Family

## 2022-06-06 ENCOUNTER — Other Ambulatory Visit: Payer: Self-pay | Admitting: Family

## 2022-06-06 ENCOUNTER — Encounter: Payer: Self-pay | Admitting: Family

## 2022-06-06 MED ORDER — SITAGLIPTIN PHOSPHATE 25 MG PO TABS: 25.0000 mg | ORAL_TABLET | Freq: Every day | ORAL | 1 refills | Status: DC

## 2022-06-07 ENCOUNTER — Telehealth: Payer: Self-pay

## 2022-06-07 NOTE — Telephone Encounter (Signed)
PA initiated via Covermymeds; KEY: [id number]. PA approved.   06-FEB-24:05-FEB-25 Januvia 25MG  OR TABS Quantity:90;

## 2022-06-08 LAB — CYTOLOGY - PAP
Comment: NEGATIVE
Diagnosis: NEGATIVE
High risk HPV: NEGATIVE

## 2022-06-09 ENCOUNTER — Encounter: Payer: Self-pay | Admitting: Family

## 2022-06-09 DIAGNOSIS — Z1211 Encounter for screening for malignant neoplasm of colon: Secondary | ICD-10-CM | POA: Diagnosis not present

## 2022-06-16 ENCOUNTER — Other Ambulatory Visit: Payer: Self-pay

## 2022-06-16 DIAGNOSIS — Y92410 Unspecified street and highway as the place of occurrence of the external cause: Secondary | ICD-10-CM | POA: Insufficient documentation

## 2022-06-16 DIAGNOSIS — M545 Low back pain, unspecified: Secondary | ICD-10-CM | POA: Insufficient documentation

## 2022-06-16 DIAGNOSIS — S20212A Contusion of left front wall of thorax, initial encounter: Secondary | ICD-10-CM | POA: Diagnosis not present

## 2022-06-16 DIAGNOSIS — S40012A Contusion of left shoulder, initial encounter: Secondary | ICD-10-CM | POA: Diagnosis not present

## 2022-06-16 DIAGNOSIS — R0781 Pleurodynia: Secondary | ICD-10-CM | POA: Insufficient documentation

## 2022-06-16 DIAGNOSIS — M25552 Pain in left hip: Secondary | ICD-10-CM | POA: Diagnosis not present
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
## 2022-06-17 ENCOUNTER — Emergency Department (HOSPITAL_BASED_OUTPATIENT_CLINIC_OR_DEPARTMENT_OTHER): Payer: BC Managed Care – PPO | Admitting: Radiology

## 2022-06-17 ENCOUNTER — Other Ambulatory Visit: Payer: Self-pay

## 2022-06-17 ENCOUNTER — Emergency Department (HOSPITAL_BASED_OUTPATIENT_CLINIC_OR_DEPARTMENT_OTHER)
Admission: EM | Admit: 2022-06-17 | Discharge: 2022-06-17 | Disposition: A | Discharge status: Home / Self Care | Payer: BC Managed Care – PPO | Attending: Emergency Medicine | Admitting: Emergency Medicine

## 2022-06-17 DIAGNOSIS — R0789 Other chest pain: Secondary | ICD-10-CM | POA: Diagnosis not present

## 2022-06-17 DIAGNOSIS — M25552 Pain in left hip: Secondary | ICD-10-CM | POA: Diagnosis not present

## 2022-06-17 DIAGNOSIS — M25512 Pain in left shoulder: Secondary | ICD-10-CM | POA: Diagnosis not present

## 2022-06-17 DIAGNOSIS — M5136 Other intervertebral disc degeneration, lumbar region: Secondary | ICD-10-CM | POA: Diagnosis not present

## 2022-06-17 DIAGNOSIS — T07XXXA Unspecified multiple injuries, initial encounter: Secondary | ICD-10-CM

## 2022-06-17 MED ORDER — METHOCARBAMOL 500 MG PO TABS: 500.0000 mg | ORAL_TABLET | Freq: Three times a day (TID) | ORAL | 0 refills | Status: DC | PRN

## 2022-06-17 MED ORDER — IBUPROFEN 800 MG PO TABS
800.0000 mg | ORAL_TABLET | Freq: Once | ORAL | Status: AC
  Administered 2022-06-17: 800 mg via ORAL
  Filled 2022-06-17: qty 1

## 2022-06-17 MED ORDER — METHOCARBAMOL 500 MG PO TABS
500.0000 mg | ORAL_TABLET | Freq: Once | ORAL | Status: AC
  Administered 2022-06-17: 500 mg via ORAL
  Filled 2022-06-17: qty 1

## 2022-06-17 MED ORDER — NAPROXEN 500 MG PO TABS: 500.0000 mg | ORAL_TABLET | Freq: Two times a day (BID) | ORAL | 0 refills | Status: DC | PRN
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
## 2022-06-20 DIAGNOSIS — G5601 Carpal tunnel syndrome, right upper limb: Secondary | ICD-10-CM | POA: Diagnosis not present

## 2022-06-20 LAB — COLOGUARD: COLOGUARD: NEGATIVE

## 2022-06-27 ENCOUNTER — Encounter: Payer: Self-pay | Admitting: Family

## 2022-06-30 DIAGNOSIS — H25813 Combined forms of age-related cataract, bilateral: Secondary | ICD-10-CM | POA: Diagnosis not present

## 2022-06-30 DIAGNOSIS — H524 Presbyopia: Secondary | ICD-10-CM | POA: Diagnosis not present

## 2022-06-30 DIAGNOSIS — E119 Type 2 diabetes mellitus without complications: Secondary | ICD-10-CM | POA: Diagnosis not present

## 2022-06-30 DIAGNOSIS — H52223 Regular astigmatism, bilateral: Secondary | ICD-10-CM | POA: Diagnosis not present

## 2022-06-30 DIAGNOSIS — H5203 Hypermetropia, bilateral: Secondary | ICD-10-CM | POA: Diagnosis not present

## 2022-06-30 DIAGNOSIS — H35033 Hypertensive retinopathy, bilateral: Secondary | ICD-10-CM | POA: Diagnosis not present

## 2022-06-30 DIAGNOSIS — H2513 Age-related nuclear cataract, bilateral: Secondary | ICD-10-CM | POA: Diagnosis not present

## 2022-08-20 ENCOUNTER — Encounter: Payer: Self-pay | Admitting: Family

## 2022-08-20 ENCOUNTER — Other Ambulatory Visit: Payer: Self-pay | Admitting: Family

## 2022-08-22 ENCOUNTER — Other Ambulatory Visit: Payer: Self-pay | Admitting: Family

## 2022-08-22 MED ORDER — SITAGLIPTIN PHOSPHATE 25 MG PO TABS: 25.0000 mg | ORAL_TABLET | Freq: Every day | ORAL | 1 refills | Status: DC

## 2022-09-02 ENCOUNTER — Ambulatory Visit: Payer: BC Managed Care – PPO | Admitting: Family

## 2022-09-22 ENCOUNTER — Encounter: Payer: Self-pay | Admitting: Family

## 2022-09-22 ENCOUNTER — Ambulatory Visit (INDEPENDENT_AMBULATORY_CARE_PROVIDER_SITE_OTHER): Payer: BC Managed Care – PPO | Admitting: Family

## 2022-09-22 VITALS — BP 122/68 | HR 81 | Ht 63.0 in | Wt 157.4 lb

## 2022-09-22 DIAGNOSIS — M79643 Pain in unspecified hand: Secondary | ICD-10-CM | POA: Diagnosis not present

## 2022-09-22 DIAGNOSIS — E118 Type 2 diabetes mellitus with unspecified complications: Secondary | ICD-10-CM | POA: Diagnosis not present

## 2022-09-22 DIAGNOSIS — Z7984 Long term (current) use of oral hypoglycemic drugs: Secondary | ICD-10-CM

## 2022-09-22 DIAGNOSIS — E785 Hyperlipidemia, unspecified: Secondary | ICD-10-CM | POA: Diagnosis not present

## 2022-09-22 DIAGNOSIS — E7800 Pure hypercholesterolemia, unspecified: Secondary | ICD-10-CM

## 2022-09-22 LAB — COMPREHENSIVE METABOLIC PANEL
ALT: 14 U/L (ref 0–35)
AST: 15 U/L (ref 0–37)
Albumin: 4.4 g/dL (ref 3.5–5.2)
Alkaline Phosphatase: 104 U/L (ref 39–117)
BUN: 16 mg/dL (ref 6–23)
CO2: 27 mEq/L (ref 19–32)
Calcium: 9.3 mg/dL (ref 8.4–10.5)
Chloride: 105 mEq/L (ref 96–112)
Creatinine, Ser: 1.19 mg/dL (ref 0.40–1.20)
GFR: 49.3 mL/min — ABNORMAL LOW (ref >60.00)
Glucose, Bld: 98 mg/dL (ref 70–99)
Potassium: 4.3 mEq/L (ref 3.5–5.1)
Sodium: 139 mEq/L (ref 135–145)
Total Bilirubin: 0.8 mg/dL (ref 0.2–1.2)
Total Protein: 7 g/dL (ref 6.0–8.3)

## 2022-09-22 LAB — CBC WITH DIFFERENTIAL/PLATELET
Basophils Absolute: 0 10*3/uL (ref 0.0–0.1)
Basophils Relative: 0.3 % (ref 0.0–3.0)
Eosinophils Absolute: 0.1 10*3/uL (ref 0.0–0.7)
Eosinophils Relative: 0.9 % (ref 0.0–5.0)
HCT: 40.6 % (ref 36.0–46.0)
Hemoglobin: 13.3 g/dL (ref 12.0–15.0)
Lymphocytes Relative: 42.5 % (ref 12.0–46.0)
Lymphs Abs: 4 10*3/uL (ref 0.7–4.0)
MCHC: 32.9 g/dL (ref 30.0–36.0)
MCV: 83.1 fl (ref 78.0–100.0)
Monocytes Absolute: 0.7 10*3/uL (ref 0.1–1.0)
Monocytes Relative: 7 % (ref 3.0–12.0)
Neutro Abs: 4.6 10*3/uL (ref 1.4–7.7)
Neutrophils Relative %: 49.3 % (ref 43.0–77.0)
Platelets: 225 10*3/uL (ref 150.0–400.0)
RBC: 4.89 Mil/uL (ref 3.87–5.11)
RDW: 14 % (ref 11.5–15.5)
WBC: 9.3 10*3/uL (ref 4.0–10.5)

## 2022-09-22 LAB — LIPID PANEL
Cholesterol: 125 mg/dL (ref 0–200)
HDL: 36.5 mg/dL — ABNORMAL LOW (ref >39.00)
LDL Cholesterol: 69 mg/dL (ref 0–99)
NonHDL: 88.89
Total CHOL/HDL Ratio: 3
Triglycerides: 99 mg/dL (ref 0.0–149.0)
VLDL: 19.8 mg/dL (ref 0.0–40.0)

## 2022-09-22 LAB — HEMOGLOBIN A1C: Hgb A1c MFr Bld: 6.8 % — ABNORMAL HIGH (ref 4.6–6.5)

## 2022-09-22 LAB — URIC ACID: Uric Acid, Serum: 6.3 mg/dL (ref 2.4–7.0)

## 2022-09-22 MED ORDER — ATORVASTATIN CALCIUM 40 MG PO TABS: 40.0000 mg | ORAL_TABLET | Freq: Every day | ORAL | 3 refills | Status: DC
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
## 2022-09-23 ENCOUNTER — Encounter: Payer: Self-pay | Admitting: Family

## 2022-10-20 ENCOUNTER — Ambulatory Visit (HOSPITAL_COMMUNITY): Payer: BC Managed Care – PPO | Attending: Family

## 2022-10-29 ENCOUNTER — Encounter: Payer: Self-pay | Admitting: Family

## 2022-10-31 ENCOUNTER — Telehealth: Payer: Self-pay | Admitting: Family

## 2022-10-31 NOTE — Telephone Encounter (Signed)
Called pt and left a VM advising pt to call the office back regarding appt on 11/01/2022.

## 2022-10-31 NOTE — Telephone Encounter (Signed)
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

## 2022-10-31 NOTE — Telephone Encounter (Signed)
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 Can we get her to clarify what type of clearance she needs. I am not certified to do any type of DOT physical if that is what she needs. Just don't want to waste her time.

## 2022-11-01 ENCOUNTER — Encounter: Payer: Self-pay | Admitting: Family

## 2022-11-01 ENCOUNTER — Ambulatory Visit (INDEPENDENT_AMBULATORY_CARE_PROVIDER_SITE_OTHER): Payer: BC Managed Care – PPO | Admitting: Family

## 2022-11-01 VITALS — BP 134/82 | HR 95 | Ht 63.0 in | Wt 159.0 lb

## 2022-11-01 DIAGNOSIS — Z0184 Encounter for antibody response examination: Secondary | ICD-10-CM | POA: Diagnosis not present

## 2022-11-01 DIAGNOSIS — Z111 Encounter for screening for respiratory tuberculosis: Secondary | ICD-10-CM | POA: Diagnosis not present

## 2022-11-01 NOTE — Progress Notes (Signed)
Julie Dawson is a 62 y.o. female with the following history as recorded in [employer]:  Patient Active Problem List   Diagnosis Date Noted   Chronic constipation 05/05/2020   Tinea manus 12/03/2019   Allergic contact dermatitis due to metals 12/03/2019   Temporal pain 04/29/2019   Numbness of fingers of both hands 04/29/2019   Chronic bilateral low back pain without sciatica 02/04/2018   Cigarette nicotine dependence with nicotine-induced disorder 07/13/2017   Type 2 diabetes mellitus with complication, without long-term current use of insulin (HCC) 08/23/2016   PPD positive, treated 03/14/2013   NONSPEC REACT TUBERCULIN SKIN 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 90 tablet 3   Blood Glucose Monitoring Suppl DEVI 1 each by Does not apply route in the morning, at noon, and at bedtime. May substitute to any manufacturer covered by patient's insurance. 1 each 0   sitaGLIPtin (JANUVIA) 25 MG tablet Take 1 tablet (25 mg total) by mouth daily. 90 tablet 1   No current facility-administered medications for this visit.    Allergies: Ace inhibitors, Jardiance [empagliflozin], Lisinopril, Metformin and related, and Mounjaro [tirzepatide]  Past Medical History:  Diagnosis Date   Allergy    spring time only    Constipation    doesnt use otc medicines   Diabetes mellitus without complication (HCC)    History of positive PPD    cannot do the tine test,  has to do CXR   Hyperlipidemia     Past Surgical History:  Procedure Laterality Date   cervical adenitis node remova     ECTOPIC PREGNANCY SURGERY      MOUTH SURGERY     for wisdom teeth removal    Family History  Problem Relation Age of Onset   Diabetes Mother    Hypertension Mother    COPD Mother    Stroke Father    Diabetes Father    Hypertension Father    Diabetes Sister    Alcohol abuse Sister    Drug abuse Brother    Colon cancer Neg Hx    Colon polyps Neg Hx    Esophageal cancer Neg Hx    Rectal cancer Neg Hx    Stomach cancer Neg Hx    Breast cancer Neg Hx     Social History   Tobacco Use   Smoking status: Every Day    Packs/day: 1    Types: Cigarettes   Smokeless tobacco: Never  Substance Use Topics   Alcohol use: No    Subjective:   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   Objective:  Vitals:   11/01/22 1432  BP: 134/82  Pulse: 95  SpO2: 99%  Weight: 159 lb (72.1 kg)  Height: 5\' 3"  (1.6 m)    General: Well developed, well nourished, in no acute distress  Skin : Warm and dry.  Head: Normocephalic and atraumatic  Eyes: Sclera and conjunctiva clear; pupils round and reactive to light; extraocular movements  intact  Ears: External normal; canals clear; tympanic membranes normal  Oropharynx: Pink, supple. No suspicious lesions  Neck: Supple without thyromegaly, adenopathy  Lungs: Respirations unlabored; clear to auscultation bilaterally without wheeze, rales, rhonchi  CVS exam: normal rate and regular rhythm.  Musculoskeletal: No deformities; no active joint inflammation  Extremities: No edema, cyanosis, clubbing  Vessels: Symmetric bilaterally  Neurologic: Alert and oriented; speech intact; face symmetrical; moves all extremities well; CNII-XII intact without focal deficit   Assessment:  1. Screening-pulmonary TB   2. Immunity status testing     Plan:  Will complete paperwork as requested; will update CXR and labs today as well- will  let patient know when forms are ready for her to pick up;   Encouraged her to get her coronary calcium CT test re-scheduled;   No follow-ups on file.  Orders Placed This Encounter  Procedures   DG Chest 2 View    Standing Status:   Future    Standing Expiration Date:   05/04/2023    Order Specific Question:   Reason for Exam (SYMPTOM  OR DIAGNOSIS REQUIRED)    Answer:   TB Screen/ per patient, history of +PPD    Order Specific Question:   Preferred imaging location?    Answer:   [facility]   Measles/Mumps/Rubella Immunity    Requested Prescriptions    No prescriptions requested or ordered in this encounter

## 2022-11-02 ENCOUNTER — Encounter: Payer: Self-pay | Admitting: Family

## 2022-11-02 ENCOUNTER — Ambulatory Visit (INDEPENDENT_AMBULATORY_CARE_PROVIDER_SITE_OTHER)
Admission: RE | Admit: 2022-11-02 | Discharge: 2022-11-02 | Disposition: A | Discharge status: Home / Self Care | Payer: BC Managed Care – PPO | Source: Ambulatory Visit | Attending: Family | Admitting: Family

## 2022-11-02 ENCOUNTER — Other Ambulatory Visit: Payer: Self-pay | Admitting: Family

## 2022-11-02 DIAGNOSIS — E7800 Pure hypercholesterolemia, unspecified: Secondary | ICD-10-CM

## 2022-11-02 DIAGNOSIS — Z111 Encounter for screening for respiratory tuberculosis: Secondary | ICD-10-CM | POA: Diagnosis not present

## 2022-11-02 LAB — MEASLES/MUMPS/RUBELLA IMMUNITY
Mumps IgG: 183 AU/mL
Rubella: 10.2 Index
Rubeola IgG: >300 AU/mL

## 2022-11-02 MED ORDER — ATORVASTATIN CALCIUM 40 MG PO TABS: 40.0000 mg | ORAL_TABLET | Freq: Every day | ORAL | 3 refills | Status: DC

## 2022-11-07 ENCOUNTER — Encounter: Payer: Self-pay | Admitting: Family

## 2022-11-10 ENCOUNTER — Encounter: Payer: Self-pay | Admitting: Family

## 2022-11-10 DIAGNOSIS — E7800 Pure hypercholesterolemia, unspecified: Secondary | ICD-10-CM

## 2022-11-10 MED ORDER — SITAGLIPTIN PHOSPHATE 25 MG PO TABS: 25.0000 mg | ORAL_TABLET | Freq: Every day | ORAL | 0 refills | Status: DC

## 2022-11-10 MED ORDER — ATORVASTATIN CALCIUM 40 MG PO TABS: 40.0000 mg | ORAL_TABLET | Freq: Every day | ORAL | 0 refills | Status: DC

## 2022-11-18 ENCOUNTER — Ambulatory Visit (HOSPITAL_COMMUNITY)
Admission: RE | Admit: 2022-11-18 | Discharge: 2022-11-18 | Disposition: A | Discharge status: Home / Self Care | Payer: BC Managed Care – PPO | Source: Ambulatory Visit | Attending: Family | Admitting: Family

## 2022-11-18 DIAGNOSIS — E785 Hyperlipidemia, unspecified: Secondary | ICD-10-CM | POA: Insufficient documentation

## 2022-11-18 DIAGNOSIS — E118 Type 2 diabetes mellitus with unspecified complications: Secondary | ICD-10-CM | POA: Insufficient documentation

## 2022-11-21 ENCOUNTER — Encounter: Payer: Self-pay | Admitting: Family

## 2022-11-21 ENCOUNTER — Other Ambulatory Visit: Payer: Self-pay | Admitting: Family

## 2022-11-21 DIAGNOSIS — F17219 Nicotine dependence, cigarettes, with unspecified nicotine-induced disorders: Secondary | ICD-10-CM

## 2022-11-21 DIAGNOSIS — E118 Type 2 diabetes mellitus with unspecified complications: Secondary | ICD-10-CM

## 2022-11-21 DIAGNOSIS — R931 Abnormal findings on diagnostic imaging of heart and coronary circulation: Secondary | ICD-10-CM

## 2022-12-03 NOTE — Progress Notes (Unsigned)
Cardiology Office Note:  .   Date:  12/07/2022  ID:  Julie Dawson, DOB May 16, 1960, MRN [id number] PCP: Olive Bass, FNP  [facility] Providers Cardiologist:  None    Patient Profile: .      PMH: Hyperlipidemia Elevated coronary calcium score  Type 2 DM Family history Father - heart attacks and strokes, unsure of age, passed away in his 70s Mother - diabetes, high blood pressure, deceased age 62  (smoked until age 59) Tobacco abuse   She is referred to cardiology prevention clinic for evaluation of elevated coronary artery calcium score.        History of Present Illness: .   Julie Dawson is a very pleasant 62 y.o. female who is here today for evaluation of elevated coronary calcium score. She was told she had cholesterol behind her eyes and reported this to her PCP who ordered coronary calcium score. She has been on atorvastatin for many years. She self increased her dose from 20 to 40 mg after meeting with the eye doctor. History of pain in the muscles in her upper arms and joint pain in her hands. Thought side effects were 2/2 Mounjaro so she stopped it, however she has continued to have the discomfort in her arms and hands. Has not noticed that it significantly worsened on higher dose of atorvastatin but she has a high pain tolerance. She works night shift in Office manager, job is sedentary. Admits she does not do a lot of walking and does not exercise. Continues to smoke cigarettes, increased to 1 ppd over the past 3 years.  Diet: Typically avoids beef Likes to eat seafood, chicken and [place] Knows healthy diet options just does not spend time preparing them   Activity: No regular exercise  ASCVD Risk Score:  24%   ROS: See HPI       Studies Reviewed: .       CT Cardiac Score 11/19/22  Risk Assessment/Calculations:             Physical Exam:   VS:  BP 138/74 (BP Location: Left Arm, Patient Position: Sitting, Cuff Size: Normal)   Pulse 87   Ht  5\' 3"  (1.6 m)   Wt 163 lb 6.4 oz (74.1 kg)   SpO2 93%   BMI 28.95 kg/m    Wt Readings from Last 3 Encounters:  12/07/22 163 lb 6.4 oz (74.1 kg)  11/01/22 159 lb (72.1 kg)  09/22/22 157 lb 6.4 oz (71.4 kg)    GEN: Well nourished, well developed in no acute distress NECK: No JVD; No carotid bruits CARDIAC: RRR, no murmurs, rubs, gallops RESPIRATORY:  Clear to auscultation without rales, wheezing or rhonchi  ABDOMEN: Soft, non-tender, non-distended EXTREMITIES:  No edema; No deformity     ASSESSMENT AND PLAN: .    Elevated coronary artery calcium score/CAD: CT calcium score completed 11/18/2022 which revealed CAC 323 (97th percentile). She denies chest pain, dyspnea, or other symptoms concerning for angina.  No indication for further ischemic evaluation at this time.  She is at goal for LDL cholesterol based on lab results from May, we are rechecking today along with lipoprotein a for further risk stratification. Encouraged secondary prevention including 150 minutes moderate intensity exercise each week, heart healthy diet limiting saturated fat, sugar, and simple carbohydrates. We did not discuss initiating aspirin therapy, plan to discuss at next office visit.   Hyperlipidemia LDL goal < 70: LDL 69, triglycerides 99, HDL 36.5 on 09/22/22. These were collected  around the time she increased her atorvastatin. We will recheck today and include lipoprotein (a) for further risk stratification. We discussed potential treatment options including PCSK9i and bempedoic acid. For now, she will continue atorvastatin.   Tobacco dependence: We discussed that this increases her risk of ASCVD. She is motivated to quit. She does not want to use medication to help her quit, has quit 2 previous times on her own.  Encouraged her to set a quit date and work towards that goal but gradually decreasing number of cigarettes. Complete cessation advised.   CV Risk Evaluation: ASCVD risk score is elevated at 24%. We  discussed the factors that affect this including tobacco abuse, hyperlipidemia, diabetes, age and race. She is motivated to make lifestyle changes to reduce her risk. Consideration will be given to more potent cholesterol lowering therapy after review of lipid panel that we will get today.        Dispo: 6 weeks with me  Signed, Eligha Bridegroom, NP-C

## 2022-12-07 ENCOUNTER — Ambulatory Visit (HOSPITAL_BASED_OUTPATIENT_CLINIC_OR_DEPARTMENT_OTHER): Payer: BC Managed Care – PPO | Admitting: Nurse Practitioner

## 2022-12-07 ENCOUNTER — Encounter (HOSPITAL_BASED_OUTPATIENT_CLINIC_OR_DEPARTMENT_OTHER): Payer: Self-pay | Admitting: Nurse Practitioner

## 2022-12-07 VITALS — BP 138/74 | HR 87 | Ht 63.0 in | Wt 163.4 lb

## 2022-12-07 DIAGNOSIS — I2510 Atherosclerotic heart disease of native coronary artery without angina pectoris: Secondary | ICD-10-CM | POA: Diagnosis not present

## 2022-12-07 DIAGNOSIS — F17200 Nicotine dependence, unspecified, uncomplicated: Secondary | ICD-10-CM

## 2022-12-07 DIAGNOSIS — R931 Abnormal findings on diagnostic imaging of heart and coronary circulation: Secondary | ICD-10-CM | POA: Diagnosis not present

## 2022-12-07 DIAGNOSIS — E785 Hyperlipidemia, unspecified: Secondary | ICD-10-CM

## 2022-12-07 DIAGNOSIS — Z9189 Other specified personal risk factors, not elsewhere classified: Secondary | ICD-10-CM

## 2022-12-07 NOTE — Patient Instructions (Signed)
Medication Instructions:   Your physician recommends that you continue on your current medications as directed. Please refer to the Current Medication list given to you today.   *If you need a refill on your cardiac medications before your next appointment, please call your pharmacy*   Lab Work:  TODAY!!!! NMR/LIPO A /CMET  If you have labs (blood work) drawn today and your tests are completely normal, you will receive your results only by: MyChart Message (if you have MyChart) OR A paper copy in the mail If you have any lab test that is abnormal or we need to change your treatment, we will call you to review the results.   Testing/Procedures:  None ordered.   Follow-Up: At [facility], you and your health needs are our priority.  As part of our continuing mission to provide you with exceptional heart care, we have created designated Provider Care Teams.  These Care Teams include your primary Cardiologist (physician) and Advanced Practice Providers (APPs -  Physician Assistants and Nurse Practitioners) who all work together to provide you with the care you need, when you need it.  We recommend signing up for the patient portal called "MyChart".  Sign up information is provided on this After Visit Summary.  MyChart is used to connect with patients for Virtual Visits (Telemedicine).  Patients are able to view lab/test results, encounter notes, upcoming appointments, etc.  Non-urgent messages can be sent to your provider as well.   To learn more about what you can do with MyChart, go to [email or web address].    Your next appointment:   6 week(s)  Provider:   Eligha Bridegroom, NP    Other Instructions Adopting a Healthy Lifestyle.   Weight: Know what a healthy weight is for you (roughly BMI <25) and aim to maintain this. You can calculate your body mass index on your smart phone. Unfortunately, this is not the most accurate measure of healthy weight, but it is the  simplest measurement to use. A more accurate measurement involves body scanning which measures lean muscle, fat tissue and bony density. We do not have this equipment at [facility].    Diet: Aim for 7+ servings of fruits and vegetables daily Limit animal fats in diet for cholesterol and heart health - choose grass fed whenever available Avoid highly processed foods (fast food burgers, tacos, fried chicken, pizza, hot dogs, french fries)  Saturated fat comes in the form of butter, lard, coconut oil, margarine, partially hydrogenated oils, and fat in meat. These increase your risk of cardiovascular disease.  Use healthy plant oils, such as olive, canola, soy, corn, sunflower and peanut.  Whole foods such as fruits, vegetables and whole grains have fiber  Men need > 38 grams of fiber per day Women need > 25 grams of fiber per day  Load up on vegetables and fruits - one-half of your plate: Aim for color and variety, and remember that potatoes dont count. Go for whole grains - one-quarter of your plate: Whole wheat, barley, wheat berries, quinoa, oats, brown rice, and foods made with them. If you want pasta, go with whole wheat pasta. Protein power - one-quarter of your plate: Fish, chicken, beans, and nuts are all healthy, versatile protein sources. Limit red meat. You need carbohydrates for energy! The type of carbohydrate is more important than the amount. Choose carbohydrates such as vegetables, fruits, whole grains, beans, and nuts in the place of white rice, white pasta, potatoes (baked or fried), macaroni and cheese, cakes,  cookies, and donuts.  If youre thirsty, drink water. Coffee and tea are good in moderation, but skip sugary drinks and limit milk and dairy products to one or two daily servings. Keep sugar intake at 6 teaspoons or 24 grams or LESS       Exercise: Aim for 150 min of moderate intensity exercise weekly for heart health, and weights twice weekly for bone health Stay active -  any steps are better than no steps! Aim for 7-9 hours of sleep daily          Mediterranean Diet  Why follow it? Research shows. Those who follow the Mediterranean diet have a reduced risk of heart disease  The diet is associated with a reduced incidence of Parkinson's and Alzheimer's diseases People following the diet may have longer life expectancies and lower rates of chronic diseases  The Dietary Guidelines for Americans recommends the Mediterranean diet as an eating plan to promote health and prevent disease  What Is the Mediterranean Diet?  Healthy eating plan based on typical foods and recipes of Mediterranean-style cooking The diet is primarily a plant based diet; these foods should make up a majority of meals   Starches - Plant based foods should make up a majority of meals - They are an important sources of vitamins, minerals, energy, antioxidants, and fiber - Choose whole grains, foods high in fiber and minimally processed items  - Typical grain sources include wheat, oats, barley, corn, brown rice, bulgar, farro, millet, polenta, couscous  - Various types of beans include chickpeas, lentils, fava beans, black beans, white beans   Fruits  Veggies - Large quantities of antioxidant rich fruits & veggies; 6 or more servings  - Vegetables can be eaten raw or lightly drizzled with oil and cooked  - Vegetables common to the traditional Mediterranean Diet include: artichokes, arugula, beets, broccoli, brussel sprouts, cabbage, carrots, celery, collard greens, cucumbers, eggplant, kale, leeks, lemons, lettuce, mushrooms, okra, onions, peas, peppers, potatoes, pumpkin, radishes, rutabaga, shallots, spinach, sweet potatoes, turnips, zucchini - Fruits common to the Mediterranean Diet include: apples, apricots, avocados, cherries, clementines, dates, figs, grapefruits, grapes, melons, nectarines, oranges, peaches, pears, pomegranates, strawberries, tangerines  Fats - Replace butter and  margarine with healthy oils, such as olive oil, canola oil, and tahini  - Limit nuts to no more than a handful a day  - Nuts include walnuts, almonds, pecans, pistachios, pine nuts  - Limit or avoid candied, honey roasted or heavily salted nuts - Olives are central to the [employer] - can be eaten whole or used in a variety of dishes   Meats Protein - Limiting red meat: no more than a few times a month - When eating red meat: choose lean cuts and keep the portion to the size of deck of cards - Eggs: approx. 0 to 4 times a week  - Fish and lean poultry: at least 2 a week  - Healthy protein sources include, chicken, [place], lean beef, lamb - Increase intake of seafood such as tuna, salmon, trout, mackerel, shrimp, scallops - Avoid or limit high fat processed meats such as sausage and bacon  Dairy - Include moderate amounts of low fat dairy products  - Focus on healthy dairy such as fat free yogurt, skim milk, low or reduced fat cheese - Limit dairy products higher in fat such as whole or 2% milk, cheese, ice cream  Alcohol - Moderate amounts of red wine is ok  - No more than 5 oz daily  for women (all ages) and men older than age 77  - No more than 10 oz of wine daily for men younger than 58  Other - Limit sweets and other desserts  - Use herbs and spices instead of salt to flavor foods  - Herbs and spices common to the traditional Mediterranean Diet include: basil, bay leaves, chives, cloves, cumin, fennel, garlic, lavender, marjoram, mint, oregano, parsley, pepper, rosemary, sage, savory, sumac, tarragon, thyme   It's not just a diet, it's a lifestyle:  The Mediterranean diet includes lifestyle factors typical of those in the region  Foods, drinks and meals are best eaten with others and savored Daily physical activity is important for overall good health This could be strenuous exercise like running and aerobics This could also be more leisurely activities such as walking,  housework, yard-work, or taking the stairs Moderation is the key; a balanced and healthy diet accommodates most foods and drinks Consider portion sizes and frequency of consumption of certain foods   Meal Ideas & Options:  Breakfast:  Whole wheat toast or whole wheat English muffins with peanut butter & hard boiled egg Steel cut oats topped with apples & cinnamon and skim milk  Fresh fruit: banana, strawberries, melon, berries, peaches  Smoothies: strawberries, bananas, greek yogurt, peanut butter Low fat greek yogurt with blueberries and granola  Egg white omelet with spinach and mushrooms Breakfast couscous: whole wheat couscous, apricots, skim milk, cranberries  Sandwiches:  Hummus and grilled vegetables (peppers, zucchini, squash) on whole wheat bread   Grilled chicken on whole wheat pita with lettuce, tomatoes, cucumbers or tzatziki  [place] salad on whole wheat bread: tuna salad made with greek yogurt, olives, red peppers, capers, green onions Garlic rosemary lamb pita: lamb sauted with garlic, rosemary, salt & pepper; add lettuce, cucumber, greek yogurt to pita - flavor with lemon juice and black pepper  Seafood:  Mediterranean grilled salmon, seasoned with garlic, basil, parsley, lemon juice and black pepper Shrimp, lemon, and spinach whole-grain pasta salad made with low fat greek yogurt  Seared scallops with lemon orzo  Seared tuna steaks seasoned salt, pepper, coriander topped with tomato mixture of olives, tomatoes, olive oil, minced garlic, parsley, green onions and cappers  Meats:  Herbed greek chicken salad with kalamata olives, cucumber, feta  Red bell peppers stuffed with spinach, bulgur, lean ground beef (or lentils) & topped with feta   Kebabs: skewers of chicken, tomatoes, onions, zucchini, squash  [place] burgers: made with red onions, mint, dill, lemon juice, feta cheese topped with roasted red peppers Vegetarian Cucumber salad: cucumbers, artichoke hearts, celery, red  onion, feta cheese, tossed in olive oil & lemon juice  Hummus and whole grain pita points with a greek salad (lettuce, tomato, feta, olives, cucumbers, red onion) Lentil soup with celery, carrots made with vegetable broth, garlic, salt and pepper  Tabouli salad: parsley, bulgur, mint, scallions, cucumbers, tomato, radishes, lemon juice, olive oil, salt and pepper.   Managing the Challenge of Quitting Smoking Quitting smoking is a physical and mental challenge. You may have cravings, withdrawal symptoms, and temptation to smoke. Before quitting, work with your health care provider to make a plan that can help you manage quitting. Making a plan before you quit may keep you from smoking when you have the urge to smoke while trying to quit. How to manage lifestyle changes Managing stress Stress can make you want to smoke, and wanting to smoke may cause stress. It is important to find ways to manage your  stress. You could try some of the following: Practice relaxation techniques. Breathe slowly and deeply, in through your nose and out through your mouth. Listen to music. Soak in a bath or take a shower. Imagine a peaceful place or vacation. Get some support. Talk with family or friends about your stress. Join a support group. Talk with a counselor or therapist. Get some physical activity. Go for a walk, run, or bike ride. Play a favorite sport. Practice yoga.  Medicines Talk with your health care provider about medicines that might help you deal with cravings and make quitting easier for you. Relationships Social situations can be difficult when you are quitting smoking. To manage this, you can: Avoid parties and other social situations where people might be smoking. Avoid alcohol. Leave right away if you have the urge to smoke. Explain to your family and friends that you are quitting smoking. Ask for support and let them know you might be a bit grumpy. Plan activities where smoking is not  an option. General instructions Be aware that many people gain weight after they quit smoking. However, not everyone does. To keep from gaining weight, have a plan in place before you quit, and stick to the plan after you quit. Your plan should include: Eating healthy snacks. When you have a craving, it may help to: Eat popcorn, or try carrots, celery, or other cut vegetables. Chew sugar-free gum. Changing how you eat. Eat small portion sizes at meals. Eat 4-6 small meals throughout the day instead of 1-2 large meals a day. Be mindful when you eat. You should avoid watching television or doing other things that might distract you as you eat. Exercising regularly. Make time to exercise each day. If you do not have time for a long workout, do short bouts of exercise for 5-10 minutes several times a day. Do some form of strengthening exercise, such as weight lifting. Do some exercise that gets your heart beating and causes you to breathe deeply, such as walking fast, running, swimming, or biking. This is very important. Drinking plenty of water or other low-calorie or no-calorie drinks. Drink enough fluid to keep your urine pale yellow.  How to recognize withdrawal symptoms Your body and mind may experience discomfort as you try to get used to not having nicotine in your system. These effects are called withdrawal symptoms. They may include: Feeling hungrier than normal. Having trouble concentrating. Feeling irritable or restless. Having trouble sleeping. Feeling depressed. Craving a cigarette. These symptoms may surprise you, but they are normal to have when quitting smoking. To manage withdrawal symptoms: Avoid places, people, and activities that trigger your cravings. Remember why you want to quit. Get plenty of sleep. Avoid coffee and other drinks that contain caffeine. These may worsen some of your symptoms. How to manage cravings Come up with a plan for how to deal with your  cravings. The plan should include the following: A definition of the specific situation you want to deal with. An activity or action you will take to replace smoking. A clear idea for how this action will help. The name of someone who could help you with this. Cravings usually last for 5-10 minutes. Consider taking the following actions to help you with your plan to deal with cravings: Keep your mouth busy. Chew sugar-free gum. Suck on hard candies or a straw. Brush your teeth. Keep your hands and body busy. Change to a different activity right away. Squeeze or play with a ball. Do an  activity or a hobby, such as making bead jewelry, practicing needlepoint, or working with wood. Mix up your normal routine. Take a short exercise break. Go for a quick walk, or run up and down stairs. Focus on doing something kind or helpful for someone else. Call a friend or family member to talk during a craving. Join a support group. Contact a quitline. Where to find support To get help or find a support group: Call the National Cancer Institute's Smoking Quitline: 1-800-QUIT-NOW 251-724-9382) Text QUIT to SmokefreeTXT: 308657 Where to find more information Visit these websites to find more information on quitting smoking: U.S. Department of Health and Human Services: www.smokefree.gov American Lung Association: www.freedomfromsmoking.org Centers for Disease Control and Prevention (CDC): FootballExhibition.com.br American Heart Association: www.heart.org Contact a health care provider if: You want to change your plan for quitting. The medicines you are taking are not helping. Your eating feels out of control or you cannot sleep. You feel depressed or become very anxious. Summary Quitting smoking is a physical and mental challenge. You will face cravings, withdrawal symptoms, and temptation to smoke again. Preparation can help you as you go through these challenges. Try different techniques to manage stress,  handle social situations, and prevent weight gain. You can deal with cravings by keeping your mouth busy (such as by chewing gum), keeping your hands and body busy, calling family or friends, or contacting a quitline for people who want to quit smoking. You can deal with withdrawal symptoms by avoiding places where people smoke, getting plenty of rest, and avoiding drinks that contain caffeine. This information is not intended to replace advice given to you by your health care provider. Make sure you discuss any questions you have with your health care provider. Document Revised: 04/09/2021 Document Reviewed: 04/09/2021 Elsevier Patient Education  2024 [employer].

## 2022-12-09 ENCOUNTER — Telehealth: Payer: Self-pay | Admitting: *Deleted

## 2022-12-09 ENCOUNTER — Encounter: Payer: Self-pay | Admitting: Family

## 2022-12-09 NOTE — Telephone Encounter (Signed)
S/w Chip Boer at [employer] [phone] due to LPA not collected code # [id number]. Will send Auth to office to get collected needs to be signed by MSW, than faxed back.  Will get confirmation if they can collect or not. Will send to [facility] to [place].

## 2022-12-10 ENCOUNTER — Encounter (HOSPITAL_BASED_OUTPATIENT_CLINIC_OR_DEPARTMENT_OTHER): Payer: Self-pay

## 2022-12-12 ENCOUNTER — Encounter: Payer: Self-pay | Admitting: Family

## 2022-12-12 NOTE — Telephone Encounter (Signed)
Received the paperwork to fill out for the LPA.  Will complete and fax back today @ [phone].

## 2022-12-13 ENCOUNTER — Encounter: Payer: Self-pay | Admitting: Family

## 2022-12-13 ENCOUNTER — Ambulatory Visit: Payer: BC Managed Care – PPO | Admitting: Family

## 2022-12-13 VITALS — BP 142/80 | HR 94 | Resp 18 | Ht 63.0 in | Wt 160.4 lb

## 2022-12-13 DIAGNOSIS — E118 Type 2 diabetes mellitus with unspecified complications: Secondary | ICD-10-CM

## 2022-12-13 DIAGNOSIS — E119 Type 2 diabetes mellitus without complications: Secondary | ICD-10-CM | POA: Diagnosis not present

## 2022-12-13 DIAGNOSIS — Z7984 Long term (current) use of oral hypoglycemic drugs: Secondary | ICD-10-CM | POA: Diagnosis not present

## 2022-12-13 LAB — HEMOGLOBIN A1C: Hgb A1c MFr Bld: 7.5 % — ABNORMAL HIGH (ref 4.6–6.5)

## 2022-12-13 NOTE — Progress Notes (Signed)
Julie Dawson is a 62 y.o. female with the following history as recorded in EpicCare:  Patient Active Problem List   Diagnosis Date Noted   Chronic constipation 05/05/2020   Tinea manus 12/03/2019   Allergic contact dermatitis due to metals 12/03/2019   Temporal pain 04/29/2019   Numbness of fingers of both hands 04/29/2019   Chronic bilateral low back pain without sciatica 02/04/2018   Cigarette nicotine dependence with nicotine-induced disorder 07/13/2017   Type 2 diabetes mellitus with complication, without long-term current use of insulin (HCC) 08/23/2016   PPD positive, treated 03/14/2013   NONSPEC REACT TUBERCULIN SKIN 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 7 tablet 0   Blood Glucose Monitoring Suppl DEVI 1 each by Does not apply route in the morning, at noon, and at bedtime. May substitute to any manufacturer covered by patient's insurance. 1 each 0   sitaGLIPtin (JANUVIA) 25 MG tablet Take 1 tablet (25 mg total) by mouth daily. 7 tablet 0   No current facility-administered medications for this visit.    Allergies: Ace inhibitors, Jardiance [empagliflozin], Lisinopril, and Metformin and related  Past Medical History:  Diagnosis Date   Allergy    spring time only    Constipation    doesnt use otc medicines   Diabetes mellitus without complication (HCC)    History of positive PPD    cannot do the tine test,  has to do CXR   Hyperlipidemia     Past Surgical History:  Procedure Laterality Date   cervical adenitis node remova     ECTOPIC PREGNANCY SURGERY     MOUTH SURGERY     for  wisdom teeth removal    Family History  Problem Relation Age of Onset   Diabetes Mother    Hypertension Mother    COPD Mother    Stroke Father    Diabetes Father    Hypertension Father    Diabetes Sister    Alcohol abuse Sister    Drug abuse Brother    Colon cancer Neg Hx    Colon polyps Neg Hx    Esophageal cancer Neg Hx    Rectal cancer Neg Hx    Stomach cancer Neg Hx    Breast cancer Neg Hx     Social History   Tobacco Use   Smoking status: Every Day    Current packs/day: 1.00    Types: Cigarettes   Smokeless tobacco: Never  Substance Use Topics   Alcohol use: No    Subjective:   Patient is planning to apply for job with [employer]- wanted to get baseline hearing test done here to be sure "she will past their hearing test." Also requesting to have hgba1c repeated today;    Objective:  Vitals:   12/13/22 1126  BP: (!) 142/80  Pulse: 94  Resp: 18  SpO2: 98%  Weight: 160 lb 6.4 oz (72.8 kg)  Height: 5\' 3"  (1.6 m)    General: Well developed, well nourished, in no acute distress  Skin : Warm and dry.  Head: Normocephalic and atraumatic  Lungs: Respirations unlabored;  Neurologic: Alert  and oriented; speech intact; face symmetrical; moves all extremities well; CNII-XII intact without focal deficit   Assessment:  1. Type 2 diabetes mellitus with complication, without long-term current use of insulin (HCC)   2. Diabetes mellitus treated with oral medication (HCC)     Plan:  Update lab as requested; follow up in 4-6 months;   No follow-ups on file.  Orders Placed This Encounter  Procedures   Hemoglobin A1c    Requested Prescriptions    No prescriptions requested or ordered in this encounter

## 2022-12-14 ENCOUNTER — Encounter: Payer: Self-pay | Admitting: Family

## 2022-12-14 ENCOUNTER — Other Ambulatory Visit: Payer: Self-pay | Admitting: Family

## 2022-12-14 DIAGNOSIS — B001 Herpesviral vesicular dermatitis: Secondary | ICD-10-CM

## 2022-12-14 MED ORDER — ACYCLOVIR 200 MG PO CAPS: 200.0000 mg | ORAL_CAPSULE | Freq: Every day | ORAL | 1 refills | Status: DC

## 2022-12-14 MED ORDER — SITAGLIPTIN PHOSPHATE 50 MG PO TABS: 50.0000 mg | ORAL_TABLET | Freq: Every day | ORAL | 1 refills | Status: DC

## 2023-01-11 ENCOUNTER — Encounter: Payer: Self-pay | Admitting: Family

## 2023-01-11 ENCOUNTER — Ambulatory Visit (HOSPITAL_BASED_OUTPATIENT_CLINIC_OR_DEPARTMENT_OTHER): Payer: BC Managed Care – PPO | Admitting: Nurse Practitioner

## 2023-01-11 ENCOUNTER — Other Ambulatory Visit: Payer: Self-pay | Admitting: Family

## 2023-01-26 ENCOUNTER — Encounter (HOSPITAL_BASED_OUTPATIENT_CLINIC_OR_DEPARTMENT_OTHER): Payer: Self-pay | Admitting: Emergency Medicine

## 2023-01-26 ENCOUNTER — Emergency Department (HOSPITAL_BASED_OUTPATIENT_CLINIC_OR_DEPARTMENT_OTHER): Payer: BC Managed Care – PPO | Admitting: Radiology

## 2023-01-26 ENCOUNTER — Emergency Department (HOSPITAL_BASED_OUTPATIENT_CLINIC_OR_DEPARTMENT_OTHER)
Admission: EM | Admit: 2023-01-26 | Discharge: 2023-01-26 | Disposition: A | Discharge status: Home / Self Care | Payer: BC Managed Care – PPO | Attending: Emergency Medicine | Admitting: Emergency Medicine

## 2023-01-26 ENCOUNTER — Other Ambulatory Visit: Payer: Self-pay

## 2023-01-26 DIAGNOSIS — M25512 Pain in left shoulder: Secondary | ICD-10-CM | POA: Diagnosis not present

## 2023-01-26 DIAGNOSIS — M79602 Pain in left arm: Secondary | ICD-10-CM | POA: Diagnosis not present

## 2023-01-26 MED ORDER — HYDROCODONE-ACETAMINOPHEN 5-325 MG PO TABS: 1.0000 | ORAL_TABLET | Freq: Four times a day (QID) | ORAL | 0 refills | Status: DC | PRN

## 2023-01-26 MED ORDER — PREDNISONE 10 MG PO TABS: 20.0000 mg | ORAL_TABLET | Freq: Two times a day (BID) | ORAL | 0 refills | Status: DC

## 2023-01-26 MED ORDER — KETOROLAC TROMETHAMINE 60 MG/2ML IM SOLN
60.0000 mg | Freq: Once | INTRAMUSCULAR | Status: AC
  Administered 2023-01-26: 60 mg via INTRAMUSCULAR
  Filled 2023-01-26: qty 2

## 2023-01-26 MED ORDER — HYDROCODONE-ACETAMINOPHEN 5-325 MG PO TABS
2.0000 | ORAL_TABLET | Freq: Once | ORAL | Status: AC
  Administered 2023-01-26: 2 via ORAL
  Filled 2023-01-26: qty 2

## 2023-01-26 NOTE — ED Provider Notes (Signed)
[place] EMERGENCY DEPARTMENT AT [facility] Provider Note   CSN: [id number] Arrival date & time: 01/26/23  0056     History  Chief Complaint  Patient presents with   Arm Pain    Julie Dawson is a 62 y.o. female.  Patient is a 62 year old female presenting with complaints of left shoulder pain.  This began after sitting in a desk for a prolonged period of time working on the computer.  She describes a sharp pain to her left shoulder blade that radiates into her arm and down into her hand.  She denies any weakness or numbness.  Pain is worse with movement and relieved somewhat with rest.  She denies any chest pain or difficulty breathing.  The history is provided by the patient.       Home Medications Prior to Admission medications   Medication Sig Start Date End Date Taking? Authorizing Provider  acyclovir (ZOVIRAX) 200 MG capsule Take 1 capsule (200 mg total) by mouth 5 (five) times daily. For up to 3 days for cold sore 12/14/22   Olive Bass, FNP  atorvastatin (LIPITOR) 40 MG tablet Take 1 tablet (40 mg total) by mouth daily. 11/10/22   Olive Bass, FNP  Blood Glucose Monitoring Suppl DEVI 1 each by Does not apply route in the morning, at noon, and at bedtime. May substitute to any manufacturer covered by patient's insurance. 06/03/22   Olive Bass, FNP  sitaGLIPtin (JANUVIA) 50 MG tablet Take 1 tablet (50 mg total) by mouth daily. 12/14/22   Olive Bass, FNP      Allergies    Ace inhibitors, Jardiance [empagliflozin], Lisinopril, and Metformin and related    Review of Systems   Review of Systems  All other systems reviewed and are negative.   Physical Exam Updated Vital Signs BP (!) 159/87 (BP Location: Right Arm)   Pulse 85   Temp 98.1 F (36.7 C) (Oral)   Resp 18   Ht 5\' 3"  (1.6 m)   Wt 72.6 kg   SpO2 98%   BMI 28.34 kg/m  Physical Exam Vitals and nursing note reviewed.  Constitutional:      General:  She is not in acute distress.    Appearance: She is well-developed. She is not diaphoretic.  HENT:     Head: Normocephalic and atraumatic.  Cardiovascular:     Rate and Rhythm: Normal rate and regular rhythm.     Heart sounds: No murmur heard.    No friction rub. No gallop.  Pulmonary:     Effort: Pulmonary effort is normal. No respiratory distress.     Breath sounds: Normal breath sounds. No wheezing.  Abdominal:     General: Bowel sounds are normal. There is no distension.     Palpations: Abdomen is soft.     Tenderness: There is no abdominal tenderness.  Musculoskeletal:        General: Normal range of motion.     Cervical back: Normal range of motion and neck supple.     Comments: The left shoulder is grossly normal in appearance.  There is tenderness over the posterior aspect of the deltoid and scapula.  She has pain with range of motion which limits exam somewhat.  Ulnar and radial pulses are easily palpable and motor and sensation are intact throughout the entire hand.  Skin:    General: Skin is warm and dry.  Neurological:     General: No focal deficit present.  Mental Status: She is alert and oriented to person, place, and time.     ED Results / Procedures / Treatments   Labs (all labs ordered are listed, but only abnormal results are displayed) Labs Reviewed - No data to display  EKG None  Radiology No results found.  Procedures Procedures    Medications Ordered in ED Medications  ketorolac (TORADOL) injection 60 mg (has no administration in time range)  HYDROcodone-acetaminophen (NORCO/VICODIN) 5-325 MG per tablet 2 tablet (has no administration in time range)    ED Course/ Medical Decision Making/ A&P  Patient presenting with complaints of left shoulder pain as described in the HPI.  I suspect tendinitis.  X-rays are negative.  Patient given Toradol and hydrocodone.  She will be discharged with prednisone, and arm sling, and a small quantity of pain  medication.  To follow-up as needed if not improving.  Final Clinical Impression(s) / ED Diagnoses Final diagnoses:  None    Rx / DC Orders ED Discharge Orders     None         Geoffery Lyons, MD 01/26/23 0225

## 2023-01-26 NOTE — Discharge Instructions (Signed)
Begin taking prednisone as prescribed and hydrocodone as prescribed as needed for pain.  Wear arm sling for comfort and support.  Follow-up with your primary doctor if not improving in the next week.

## 2023-01-26 NOTE — ED Triage Notes (Signed)
  Patient comes in with L arm pain that started a few hours ago.  Patient states she was propped up on her computer chair leaning on her L arm for approximately 2 hours.  Patient states when she went to move the arm hurt very and she couldn't lift it.  Denies any numbness.  Pulses strong.  Can move it but hurts.  Took 1000 mg of tylenol around 2100.  Pain 9/10, sharp.

## 2023-01-31 ENCOUNTER — Ambulatory Visit: Payer: BC Managed Care – PPO | Admitting: Family

## 2023-02-09 ENCOUNTER — Encounter: Payer: Self-pay | Admitting: Family

## 2023-02-09 ENCOUNTER — Other Ambulatory Visit: Payer: Self-pay | Admitting: Family

## 2023-02-09 DIAGNOSIS — M25512 Pain in left shoulder: Secondary | ICD-10-CM

## 2023-02-10 ENCOUNTER — Encounter: Payer: Self-pay | Admitting: Physician Assistant

## 2023-02-10 ENCOUNTER — Ambulatory Visit: Payer: BC Managed Care – PPO | Admitting: Physician Assistant

## 2023-02-10 DIAGNOSIS — M5412 Radiculopathy, cervical region: Secondary | ICD-10-CM

## 2023-02-10 MED ORDER — METHYLPREDNISOLONE 4 MG PO TBPK: ORAL_TABLET | ORAL | 0 refills | Status: DC

## 2023-02-10 NOTE — Progress Notes (Signed)
Office Visit Note   Patient: Julie Dawson           Date of Birth: [birth year]           MRN: [id number] Visit Date: 02/10/2023              Requested by: Olive Bass, FNP [address] [place],  [place] [address] PCP: Olive Bass, FNP   Assessment & Plan: Visit Diagnoses:  1. Radiculopathy of cervical spine     Plan: Impression is cervical spine radiculopathy.  We have discussed various treatment options to include physical therapy versus MRI.  She would like to try another course of physical therapy so I have put in a referral for this.  We have also discussed starting on a Medrol Dosepak for which she is agreeable to.  If she does not improve with medication and therapy she will let us know we will order an MRI of the cervical spine.  Follow-Up Instructions: Return if symptoms worsen or fail to improve.   Orders:  Orders Placed This Encounter  Procedures   Ambulatory referral to Physical Therapy   Meds ordered this encounter  Medications   methylPREDNISolone (MEDROL DOSEPAK) 4 MG TBPK tablet    Sig: Take as directed    Dispense:  21 tablet    Refill:  0      Procedures: No procedures performed   Clinical Data: No additional findings.   Subjective: Chief Complaint  Patient presents with   Left Shoulder - Pain    HPI patient is a pleasant 62 year old female patient of [name] who comes in today with right sided neck and shoulder pain for the past several days.  She denies any injury or change in activity.  The pain she has radiates down the right lateral neck and into the shoulder blade with occasional pain down her arm which stops at the elbow.  She has not been taking anything for this.  She denies any paresthesias to her fingers.  She does note her symptoms are worse when she rotates her head to the right.  She has been seen by Dr. Christell Constant twice in the past for cervical spine radiculopathy.  She tells me she has been  in physical therapy in the past which has helped.  Review of Systems as detailed in HPI.  All others reviewed and are negative.   Objective: Vital Signs: There were no vitals taken for this visit.  Physical Exam well-developed well-nourished female no acute distress.  Alert and oriented x 3.  Ortho Exam cervical spine exam: No spinous tenderness.  She does have tenderness to the right side and para musculature.  Increased pain with cervical spine rotation to the right.  Tenderness to the parascapular region on the right.  Shoulder exam is unremarkable.  Full strength throughout.  She is neurovascularly intact distally.  Specialty Comments:  No specialty comments available.  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   Nonspecific reaction  to tuberculin skin test without active tuberculosis 06/08/2010   INSOMNIA 12/31/2009   Herpes simplex virus (HSV) infection 01/21/2009   ONYCHOMYCOSIS 07/29/2008   HYPERCHOLESTEROLEMIA 07/29/2008   Dental caries 04/21/2008   OBESITY 03/13/2008   MENOPAUSAL SYNDROME 03/13/2008   Headache 03/13/2008   DRUG ABUSE, HX OF 03/13/2008   Past Medical History:  Diagnosis Date   Allergy    spring time only    Constipation    doesnt use otc medicines   Diabetes mellitus without complication (HCC)    History of positive PPD    cannot do the tine test,  has to do CXR   Hyperlipidemia     Family History  Problem Relation Age of Onset    Diabetes Mother    Hypertension Mother    COPD Mother    Stroke Father    Diabetes Father    Hypertension Father    Diabetes Sister    Alcohol abuse Sister    Drug abuse Brother    Colon cancer Neg Hx    Colon polyps Neg Hx    Esophageal cancer Neg Hx    Rectal cancer Neg Hx    Stomach cancer Neg Hx    Breast cancer Neg Hx     Past Surgical History:  Procedure Laterality Date   cervical adenitis node remova     ECTOPIC PREGNANCY SURGERY     MOUTH SURGERY     for wisdom teeth removal   Social History   Occupational History   Not on file  Tobacco Use   Smoking status: Every Day    Current packs/day: 1.00    Types: Cigarettes   Smokeless tobacco: Never  Vaping Use   Vaping status: Never Used  Substance and Sexual Activity   Alcohol use: No   Drug use: No    Comment: PT DENIES,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   Sexual activity: Yes    Birth control/protection: Post-menopausal

## 2023-02-23 ENCOUNTER — Ambulatory Visit: Payer: BC Managed Care – PPO | Admitting: Physical Therapy

## 2023-03-02 ENCOUNTER — Encounter: Payer: Self-pay | Admitting: Family

## 2023-03-02 ENCOUNTER — Other Ambulatory Visit: Payer: Self-pay | Admitting: Family

## 2023-03-02 ENCOUNTER — Other Ambulatory Visit: Payer: BC Managed Care – PPO

## 2023-03-02 DIAGNOSIS — Z205 Contact with and (suspected) exposure to viral hepatitis: Secondary | ICD-10-CM

## 2023-03-03 LAB — HEPATITIS C ANTIBODY: Hepatitis C Ab: NONREACTIVE

## 2023-04-04 ENCOUNTER — Emergency Department (HOSPITAL_BASED_OUTPATIENT_CLINIC_OR_DEPARTMENT_OTHER)
Admission: EM | Admit: 2023-04-04 | Discharge: 2023-04-04 | Disposition: A | Discharge status: Home / Self Care | Payer: BC Managed Care – PPO | Attending: Emergency Medicine | Admitting: Emergency Medicine

## 2023-04-04 ENCOUNTER — Other Ambulatory Visit: Payer: Self-pay

## 2023-04-04 ENCOUNTER — Encounter (HOSPITAL_BASED_OUTPATIENT_CLINIC_OR_DEPARTMENT_OTHER): Payer: Self-pay

## 2023-04-04 ENCOUNTER — Emergency Department (HOSPITAL_BASED_OUTPATIENT_CLINIC_OR_DEPARTMENT_OTHER): Payer: BC Managed Care – PPO

## 2023-04-04 DIAGNOSIS — M5412 Radiculopathy, cervical region: Secondary | ICD-10-CM | POA: Diagnosis not present

## 2023-04-04 DIAGNOSIS — M79601 Pain in right arm: Secondary | ICD-10-CM | POA: Insufficient documentation

## 2023-04-04 DIAGNOSIS — E119 Type 2 diabetes mellitus without complications: Secondary | ICD-10-CM | POA: Insufficient documentation

## 2023-04-04 DIAGNOSIS — E042 Nontoxic multinodular goiter: Secondary | ICD-10-CM | POA: Diagnosis not present

## 2023-04-04 DIAGNOSIS — M4722 Other spondylosis with radiculopathy, cervical region: Secondary | ICD-10-CM | POA: Diagnosis not present

## 2023-04-04 DIAGNOSIS — M542 Cervicalgia: Secondary | ICD-10-CM | POA: Diagnosis not present

## 2023-04-04 MED ORDER — PREDNISONE 20 MG PO TABS: 40.0000 mg | ORAL_TABLET | Freq: Every day | ORAL | 0 refills | Status: AC

## 2023-04-04 MED ORDER — KETOROLAC TROMETHAMINE 15 MG/ML IJ SOLN
15.0000 mg | Freq: Once | INTRAMUSCULAR | Status: AC
Start: 2023-04-04 — End: 2023-04-04
  Administered 2023-04-04: 15 mg via INTRAMUSCULAR
  Filled 2023-04-04: qty 1

## 2023-04-04 NOTE — Discharge Instructions (Signed)
You have been seen today for your complaint of right arm pain and neck pain. Your imaging showed some narrowing. Your discharge medications include prednisone.  This is steroid.  Take it as prescribed and for the entire duration of the prescription. You may take the naproxen twice daily along with Tylenol 650 mg 4 times per day Follow up with: Your orthopedic provider as scheduled Please seek immediate medical care if you develop any of the following symptoms: Your pain gets much worse and is not controlled with medicines. You have weakness or numbness in your hand, arm, face, or leg. You have a high fever. You have a stiff, rigid neck. You lose control of your bowels or your bladder (have incontinence). You have trouble with walking, balance, or speaking. At this time there does not appear to be the presence of an emergent medical condition, however there is always the potential for conditions to change. Please read and follow the below instructions.  Do not take your medicine if  develop an itchy rash, swelling in your mouth or lips, or difficulty breathing; call 911 and seek immediate emergency medical attention if this occurs.  You may review your lab tests and imaging results in their entirety on your MyChart account.  Please discuss all results of fully with your primary care provider and other specialist at your follow-up visit.  Note: Portions of this text may have been transcribed using voice recognition software. Every effort was made to ensure accuracy; however, inadvertent computerized transcription errors may still be present.

## 2023-04-04 NOTE — ED Notes (Signed)
Reviewed discharge instructions, medication and home care with pt. Pt verbalized understanding and had no further questions. Pt exited ED without complications.

## 2023-04-04 NOTE — ED Triage Notes (Signed)
Pt POV reporting R arm pain, seen for same outpaient, told if pain does not improved with meds then they would proceed with MRI.

## 2023-04-04 NOTE — ED Provider Notes (Signed)
[place] EMERGENCY DEPARTMENT AT [facility] Provider Note   CSN: [id number] Arrival date & time: 04/04/23  1559     History  Chief Complaint  Patient presents with   Arm Pain    Bibian Musich is a 62 y.o. female.  With a history of cervical radiculopathy, diabetes, hyperlipidemia presenting to the emergency department for evaluation of right arm pain and neck pain.  States he has been dealing with cervical radiculopathy for quite some time now.  Symptoms have been mostly in the right arm, however a few months ago the symptoms switched over to the left arm.  This was thought to be secondary to out she was sitting at work.  Her symptoms are intermittent and seem to resolve on their own after a few days typically.  4 days ago her symptoms worsened in the right upper extremity.  Symptoms began on the right side of the neck and radiate to the right scapula and down to the antecubital fossa.  She states her right arm feels "dead."  She reports some tingling to the palm of the right hand.  She denies any numbness.  She states she has been told to obtain an MRI of her neck but does not want to pay that much money.  She denies any trauma.  She did not take the steroid taper that was prescribed to her last time due to concerns about how it would affect her blood sugar.  Her last A1c was 6.8.   Arm Pain       Home Medications Prior to Admission medications   Medication Sig Start Date End Date Taking? Authorizing Provider  predniSONE (DELTASONE) 20 MG tablet Take 2 tablets (40 mg total) by mouth daily for 4 days. 04/04/23 04/08/23 Yes Buford Bremer, Edsel Petrin, PA-C  acyclovir (ZOVIRAX) 200 MG capsule Take 1 capsule (200 mg total) by mouth 5 (five) times daily. For up to 3 days for cold sore 12/14/22   Olive Bass, FNP  atorvastatin (LIPITOR) 40 MG tablet Take 1 tablet (40 mg total) by mouth daily. 11/10/22   Olive Bass, FNP  Blood Glucose Monitoring Suppl DEVI 1 each  by Does not apply route in the morning, at noon, and at bedtime. May substitute to any manufacturer covered by patient's insurance. 06/03/22   Olive Bass, FNP  HYDROcodone-acetaminophen (NORCO) 5-325 MG tablet Take 1-2 tablets by mouth every 6 (six) hours as needed. 01/26/23   Geoffery Lyons, MD  methylPREDNISolone (MEDROL DOSEPAK) 4 MG TBPK tablet Take as directed 02/10/23   Cristie Hem, PA-C  sitaGLIPtin (JANUVIA) 50 MG tablet Take 1 tablet (50 mg total) by mouth daily. 12/14/22   Olive Bass, FNP      Allergies    Ace inhibitors, Jardiance [empagliflozin], Lisinopril, and Metformin and related    Review of Systems   Review of Systems  Musculoskeletal:  Positive for neck pain.  All other systems reviewed and are negative.   Physical Exam Updated Vital Signs BP (!) 146/86   Pulse 87   Temp 98 F (36.7 C) (Oral)   Resp 17   Ht 5\' 3"  (1.6 m)   Wt 68 kg   SpO2 99%   BMI 26.57 kg/m  Physical Exam Vitals and nursing note reviewed.  Constitutional:      General: She is not in acute distress.    Appearance: Normal appearance. She is normal weight. She is not ill-appearing.  HENT:     Head: Normocephalic and  atraumatic.  Pulmonary:     Effort: Pulmonary effort is normal. No respiratory distress.  Abdominal:     General: Abdomen is flat.  Musculoskeletal:        General: Normal range of motion.     Cervical back: Neck supple.     Comments: TTP to the right paraspinal muscles, midline C-spine, right scapular spine.  Grip strength 4 out of 5 bilaterally.  Sensation intact in all digits.  Capillary refill normal.  Radial pulses 2+ bilaterally.  Spurling test positive bilaterally, worse on the left  Skin:    General: Skin is warm and dry.  Neurological:     Mental Status: She is alert and oriented to person, place, and time.  Psychiatric:        Mood and Affect: Mood normal.        Behavior: Behavior normal.     ED Results / Procedures / Treatments    Labs (all labs ordered are listed, but only abnormal results are displayed) Labs Reviewed - No data to display  EKG None  Radiology CT Cervical Spine Wo Contrast  Result Date: 04/04/2023 CLINICAL DATA:  Cervical radiculopathy, no red flags. Right arm pain. EXAM: CT CERVICAL SPINE WITHOUT CONTRAST TECHNIQUE: Multidetector CT imaging of the cervical spine was performed without intravenous contrast. Multiplanar CT image reconstructions were also generated. RADIATION DOSE REDUCTION: This exam was performed according to the departmental dose-optimization program which includes automated exposure control, adjustment of the mA and/or kV according to patient size and/or use of iterative reconstruction technique. COMPARISON:  Cervical spine radiographs 02/22/2022 FINDINGS: Alignment: Mild reversal of the normal cervical lordosis. No significant listhesis. Skull base and vertebrae: No acute fracture or suspicious osseous lesion. Soft tissues and spinal canal: No prevertebral fluid or swelling. No visible canal hematoma. Disc levels: Cervical spondylosis with anterior vertebral osteophytes which are most prominent at C5-6 and C6-7. Largely preserved disc space heights. Focally advanced right facet arthrosis at C3-4. Suspected mild spinal stenosis and mild right neural foraminal stenosis at C3-4 and mild spinal stenosis and mild left neural foraminal stenosis at C4-5. Upper chest: Clear lung apices. Other: Small right thyroid nodules measuring up to 1.2 cm for which no follow-up imaging is recommended. IMPRESSION: 1. No acute osseous abnormality. 2. Cervical spondylosis with suspected mild spinal and neural foraminal stenosis as above. Electronically Signed   By: Sebastian Ache M.D.   On: 04/04/2023 17:47    Procedures Procedures    Medications Ordered in ED Medications  ketorolac (TORADOL) 15 MG/ML injection 15 mg (has no administration in time range)    ED Course/ Medical Decision Making/ A&P                                  Medical Decision Making Amount and/or Complexity of Data Reviewed Radiology: ordered.  This patient presents to the ED for concern of right upper extremity pain, this involves an extensive number of treatment options, and is a complaint that carries with it a high risk of complications and morbidity.  Cervical radiculopathy, radial nerve palsy, MSK pain  My initial workup includes imaging  Additional history obtained from: Nursing notes from this visit.  I ordered imaging studies including CT C-spine I independently visualized and interpreted imaging which showed mild right neuroforaminal stenosis at C3-4 and at left C4-5 I agree with the radiologist interpretation  Afebrile, hypertensive but otherwise hemodynamically stable.  62 year old female presenting for  evaluation of right arm pain.  This is been present for quite some time.  She has followed up with orthopedics multiple times for this.  States physical therapy typically improves the pain, however.  Continues to return.  Was told that she likely needs an MRI but states she does not want to pay for this at this time.  She appears well on physical exam.  Neurovascular status is fully intact.  She was concerned about initiating steroids due to her blood sugar.  Last A1c was fairly well-controlled at 6.8 6 months ago.  Will trial a steroid burst of 4 days.  She was encouraged to follow-up with her orthopedic provider regarding her ongoing symptoms.  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  She was given return precautions.  Stable at discharge.  At this time there does not appear to be any evidence of an acute emergency medical condition and the patient appears stable for discharge with appropriate outpatient follow up. Diagnosis was discussed with patient who verbalizes understanding of care plan and is agreeable to discharge. I have discussed return precautions with patient  who verbalizes understanding. Patient encouraged to follow-up with their PCP within 1 week. All questions answered.  Note: Portions of this report may have been transcribed using voice recognition software. Every effort was made to ensure accuracy; however, inadvertent computerized transcription errors may still be present.        Final Clinical Impression(s) / ED Diagnoses Final diagnoses:  Cervical radiculopathy    Rx / DC Orders ED Discharge Orders          Ordered    predniSONE (DELTASONE) 20 MG tablet  Daily        04/04/23 1815              Michelle Piper, Cordelia Poche 04/04/23 1818    Tegeler, Canary Brim, MD 04/04/23 1827

## 2023-04-11 ENCOUNTER — Encounter: Payer: Self-pay | Admitting: Family

## 2023-04-11 ENCOUNTER — Encounter: Payer: Self-pay | Admitting: Physician Assistant

## 2023-04-11 ENCOUNTER — Other Ambulatory Visit: Payer: Self-pay | Admitting: Orthopaedic Surgery

## 2023-04-11 ENCOUNTER — Ambulatory Visit: Payer: BC Managed Care – PPO | Admitting: Physician Assistant

## 2023-04-11 DIAGNOSIS — M542 Cervicalgia: Secondary | ICD-10-CM | POA: Diagnosis not present

## 2023-04-11 MED ORDER — PREDNISONE 10 MG (21) PO TBPK: ORAL_TABLET | ORAL | 0 refills | Status: DC

## 2023-04-11 MED ORDER — METHOCARBAMOL 750 MG PO TABS: 750.0000 mg | ORAL_TABLET | Freq: Two times a day (BID) | ORAL | 1 refills | Status: DC | PRN

## 2023-04-11 NOTE — Progress Notes (Signed)
Office Visit Note   Patient: Julie Dawson           Date of Birth: 1961-04-24           MRN: [id number] Visit Date: 04/11/2023              Requested by: Olive Bass, FNP [address] [place],  [place] [address] PCP: Olive Bass, FNP   Assessment & Plan: Visit Diagnoses:  1. Neck pain on right side     Plan: Impression is chronic neck pain with right upper extremity radiculopathy.  At this point, I would like to order an MRI of her cervical spine to assess for structural abnormalities.  She will follow-up with Dr. Christell Constant to go over these results and for further evaluation and treatment recommendation.  Follow-Up Instructions: Return for f/u with Dr. Christell Constant to go over MRI.   Orders:  Orders Placed This Encounter  Procedures   MR Cervical Spine w/o contrast   Meds ordered this encounter  Medications   predniSONE (STERAPRED UNI-PAK 21 TAB) 10 MG (21) TBPK tablet    Sig: Take as directed    Dispense:  21 tablet    Refill:  0   methocarbamol (ROBAXIN-750) 750 MG tablet    Sig: Take 1 tablet (750 mg total) by mouth 2 (two) times daily as needed for muscle spasms.    Dispense:  20 tablet    Refill:  1      Procedures: No procedures performed   Clinical Data: No additional findings.   Subjective: Chief Complaint  Patient presents with   Neck - Follow-up    HPI patient is a pleasant 62 year old patient of Julie Dawson who comes in for the second time to see me for her neck.  She continues to complain of right sided neck pain with radiation down her arm.  She has numbness into her hand.  She has been on steroids and has completed several courses of physical therapy without relief.     Objective: Vital Signs: There were no vitals taken for this visit.    Ortho Exam unchanged cervical spine exam  Specialty Comments:  No specialty comments available.  Imaging: No new imaging   PMFS History: Patient Active Problem  List   Diagnosis Date Noted   Chronic constipation 05/05/2020   Tinea manus 12/03/2019   Allergic contact dermatitis due to metals 12/03/2019   Temporal pain 04/29/2019   Numbness of fingers of both hands 04/29/2019   Chronic bilateral low back pain without sciatica 02/04/2018   Cigarette nicotine dependence with nicotine-induced disorder 07/13/2017   Type 2 diabetes mellitus with complication, without long-term current use of insulin (HCC) 08/23/2016   PPD positive, treated 03/14/2013   Nonspecific reaction to tuberculin skin test without active tuberculosis 06/08/2010   INSOMNIA 12/31/2009   Herpes simplex virus (HSV) infection 01/21/2009   ONYCHOMYCOSIS 07/29/2008   HYPERCHOLESTEROLEMIA 07/29/2008   Dental caries 04/21/2008   OBESITY 03/13/2008   MENOPAUSAL SYNDROME 03/13/2008   Headache 03/13/2008   DRUG ABUSE, HX OF 03/13/2008   Past Medical History:  Diagnosis Date   Allergy    spring time only    Constipation    doesnt use otc medicines   Diabetes mellitus without complication (HCC)    History of positive PPD    cannot do the tine test,  has to do CXR   Hyperlipidemia     Family History  Problem Relation Age of Onset  Diabetes Mother    Hypertension Mother    COPD Mother    Stroke Father    Diabetes Father    Hypertension Father    Diabetes Sister    Alcohol abuse Sister    Drug abuse Brother    Colon cancer Neg Hx    Colon polyps Neg Hx    Esophageal cancer Neg Hx    Rectal cancer Neg Hx    Stomach cancer Neg Hx    Breast cancer Neg Hx     Past Surgical History:  Procedure Laterality Date   cervical adenitis node remova     ECTOPIC PREGNANCY SURGERY     MOUTH SURGERY     for wisdom teeth removal   Social History   Occupational History   Not on file  Tobacco Use   Smoking status: Every Day    Current packs/day: 1.00    Types: Cigarettes   Smokeless tobacco: Never  Vaping Use   Vaping status: Never Used  Substance and Sexual Activity    Alcohol use: No   Drug use: No    Comment: PT DENIES,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   Sexual activity: Yes    Birth control/protection: Post-menopausal

## 2023-04-12 ENCOUNTER — Ambulatory Visit
Admission: RE | Admit: 2023-04-12 | Discharge: 2023-04-12 | Disposition: A | Discharge status: Home / Self Care | Payer: BC Managed Care – PPO | Source: Ambulatory Visit | Attending: Orthopaedic Surgery | Admitting: Orthopaedic Surgery

## 2023-04-12 DIAGNOSIS — M50221 Other cervical disc displacement at C4-C5 level: Secondary | ICD-10-CM | POA: Diagnosis not present

## 2023-04-12 DIAGNOSIS — M4802 Spinal stenosis, cervical region: Secondary | ICD-10-CM | POA: Diagnosis not present

## 2023-04-12 DIAGNOSIS — M542 Cervicalgia: Secondary | ICD-10-CM

## 2023-04-12 DIAGNOSIS — M5021 Other cervical disc displacement,  high cervical region: Secondary | ICD-10-CM | POA: Diagnosis not present

## 2023-04-24 ENCOUNTER — Ambulatory Visit: Payer: BC Managed Care – PPO | Admitting: Orthopedic Surgery

## 2023-04-24 DIAGNOSIS — R222 Localized swelling, mass and lump, trunk: Secondary | ICD-10-CM | POA: Diagnosis not present

## 2023-04-24 DIAGNOSIS — M5412 Radiculopathy, cervical region: Secondary | ICD-10-CM | POA: Diagnosis not present

## 2023-04-24 MED ORDER — PREGABALIN 75 MG PO CAPS
75.0000 mg | ORAL_CAPSULE | Freq: Two times a day (BID) | ORAL | 1 refills | Status: DC
Start: 2023-04-24 — End: 2024-01-02

## 2023-04-24 NOTE — Progress Notes (Signed)
Orthopedic Spine Surgery Office Note  Assessment: Patient is a 62 y.o. female with neck pain that radiates into the right shoulder and lateral arm. 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    Plan: -Explained that initially conservative treatment is tried as a significant number of patients may experience relief with these treatment modalities. Discussed that the conservative treatments include:  -activity modification  -physical therapy  -over the counter pain medications  -medrol dosepak  -cervical steroid injections -Patient has tried PT, naproxen, medrol dose pak -Prescribed lyrica to try for additional pain relief -Recommended diagnostic/therapeutic injection for her radiating shoulder pain -In regards to her chest wall mass, I told her that this is not something that I routinely evaluate patients for. She was frustrated because she feels like other providers have not listened to her about this concern of hers. I ordered an MRI to work it up further. I told her if something is found, I will have to refer her to someone outside of orthopedics to determine next steps in treatment -Patient should return to office in 4-5 weeks, x-rays at next visit: AP/lateral/flex/ex cervical   Patient expressed understanding of the plan and all questions were answered to the patient's satisfaction.   ___________________________________________________________________________   History:  Patient is a 62 y.o. female who presents today for follow up on her cervical spine. Patient has had return of her neck pain that radiates into her right shoulder and lateral arm to the level of the elbow. She is not having any pain radiating into her left upper extremity. She feels pain on a daily basis. Pain is worse with activity but it is also present at rest. She says she sleeps with her arm above her head since this helps with the pain. She has not found any other activity/position particularly helpful for the  pain. Has not noticed any weakness. No loss of hand dexterity. No unsteadiness with gait.   She also reports a right lateral chest wall mass that she wanted me to look at. It is just distal to the axilla just superior and lateral to her right breast. It has been painful. She has no history of breast cancer and says she has been screened with mammograms.   Treatments tried: PT, naproxen, medrol dose pak   Physical Exam:  General: no acute distress, appears stated age Neurologic: alert, answering questions appropriately, following commands Respiratory: unlabored breathing on room air, symmetric chest rise Psychiatric: appropriate affect, normal cadence to speech   MSK (spine):  -Strength exam      Left  Right Grip strength                5/5  5/5 Interosseus   5/5   5/5 Wrist extension  5/5  5/5 Wrist flexion   5/5  5/5 Elbow flexion   5/5  5/5 Deltoid    5/5  5/5  -Sensory exam    Sensation intact to light touch in C5-T1 nerve distributions of bilateral upper extremities  -Brachioradialis DTR: 2/4 on the left, 2/4 on the right -Biceps DTR: 2/4 on the left, 2/4 on the right -Triceps DTR: 2/4 on the left, 2/4 on the right  -Spurling: negative bilaterally -Hoffman sign: negative bilaterally -Clonus: no beats bilaterally -Interosseous wasting: none seen  -Grip and release test: negative  -Romberg: negative -Gait: normal  Left shoulder exam: no pain through range of motion Right shoulder exam: no pain through range of motion, negative jobe, negative belly press, no weakness with external  rotation with arm at side  Tinel's at wrist: negative bilaterally Phalen's at wrist: negative bilaterally Durkan's: negative bilaterally  Tinel's at elbow: negative bilaterally  Patient has tenderness with a mass in the area of the right lateral chest wall just inferior to the axilla. I did not examine her breast since I do not regularly do this and did not have a  chaperone.   Imaging: XRs of the cervical spine from 02/22/2022 were independently reviewed and interpreted, showing disc height loss and anterior osteophyte formation and C4/5 and C5/6. No evidence of instability on flexion/extension views. No fracture or dislocation seen.   MRI of the cervical spine from 04/12/2023 was independently reviewed and interpreted, showing right-sided foraminal stenosis at C3/4. No significant central stenosis seen. No T2 cord signal change seen.    Patient name: Julie Dawson Patient MRN: [id number] Date of visit: 04/24/23

## 2023-05-15 ENCOUNTER — Telehealth: Payer: Self-pay | Admitting: Physical Medicine and Rehabilitation

## 2023-05-15 ENCOUNTER — Ambulatory Visit
Admission: RE | Admit: 2023-05-15 | Discharge: 2023-05-15 | Disposition: A | Discharge status: Home / Self Care | Payer: BC Managed Care – PPO | Source: Ambulatory Visit | Attending: Orthopedic Surgery | Admitting: Orthopedic Surgery

## 2023-05-15 DIAGNOSIS — R222 Localized swelling, mass and lump, trunk: Secondary | ICD-10-CM

## 2023-05-15 NOTE — Telephone Encounter (Signed)
 Patient came by. Would like to cancel appointment with [facility]

## 2023-05-16 ENCOUNTER — Encounter: Payer: BC Managed Care – PPO | Admitting: Physical Medicine and Rehabilitation

## 2023-05-19 ENCOUNTER — Ambulatory Visit: Payer: BC Managed Care – PPO | Admitting: Orthopedic Surgery

## 2023-05-19 ENCOUNTER — Telehealth: Payer: Self-pay

## 2023-05-19 ENCOUNTER — Other Ambulatory Visit: Payer: Self-pay | Admitting: Family

## 2023-05-19 ENCOUNTER — Encounter: Payer: Self-pay | Admitting: Family

## 2023-05-19 ENCOUNTER — Ambulatory Visit (INDEPENDENT_AMBULATORY_CARE_PROVIDER_SITE_OTHER): Payer: BC Managed Care – PPO | Admitting: Family

## 2023-05-19 VITALS — BP 138/70 | HR 79 | Ht 63.0 in | Wt 160.2 lb

## 2023-05-19 DIAGNOSIS — E0849 Diabetes mellitus due to underlying condition with other diabetic neurological complication: Secondary | ICD-10-CM | POA: Diagnosis not present

## 2023-05-19 DIAGNOSIS — E1149 Type 2 diabetes mellitus with other diabetic neurological complication: Secondary | ICD-10-CM

## 2023-05-19 DIAGNOSIS — E118 Type 2 diabetes mellitus with unspecified complications: Secondary | ICD-10-CM

## 2023-05-19 DIAGNOSIS — Z7985 Long-term (current) use of injectable non-insulin antidiabetic drugs: Secondary | ICD-10-CM | POA: Diagnosis not present

## 2023-05-19 LAB — COMPREHENSIVE METABOLIC PANEL
ALT: 13 U/L (ref 0–35)
AST: 10 U/L (ref 0–37)
Albumin: 4.5 g/dL (ref 3.5–5.2)
Alkaline Phosphatase: 146 U/L — ABNORMAL HIGH (ref 39–117)
BUN: 16 mg/dL (ref 6–23)
CO2: 26 meq/L (ref 19–32)
Calcium: 9.3 mg/dL (ref 8.4–10.5)
Chloride: 97 meq/L (ref 96–112)
Creatinine, Ser: 1.2 mg/dL (ref 0.40–1.20)
GFR: 48.59 mL/min — ABNORMAL LOW (ref >60.00)
Glucose, Bld: 488 mg/dL — ABNORMAL HIGH (ref 70–99)
Potassium: 4.4 meq/L (ref 3.5–5.1)
Sodium: 133 meq/L — ABNORMAL LOW (ref 135–145)
Total Bilirubin: 0.9 mg/dL (ref 0.2–1.2)
Total Protein: 7 g/dL (ref 6.0–8.3)

## 2023-05-19 LAB — CBC WITH DIFFERENTIAL/PLATELET
Basophils Absolute: 0.1 10*3/uL (ref 0.0–0.1)
Basophils Relative: 0.6 % (ref 0.0–3.0)
Eosinophils Absolute: 0 10*3/uL (ref 0.0–0.7)
Eosinophils Relative: 0.4 % (ref 0.0–5.0)
HCT: 40.2 % (ref 36.0–46.0)
Hemoglobin: 13.6 g/dL (ref 12.0–15.0)
Lymphocytes Relative: 35.6 % (ref 12.0–46.0)
Lymphs Abs: 3.4 10*3/uL (ref 0.7–4.0)
MCHC: 33.7 g/dL (ref 30.0–36.0)
MCV: 82.3 fL (ref 78.0–100.0)
Monocytes Absolute: 0.5 10*3/uL (ref 0.1–1.0)
Monocytes Relative: 5.6 % (ref 3.0–12.0)
Neutro Abs: 5.5 10*3/uL (ref 1.4–7.7)
Neutrophils Relative %: 57.8 % (ref 43.0–77.0)
Platelets: 220 10*3/uL (ref 150.0–400.0)
RBC: 4.89 Mil/uL (ref 3.87–5.11)
RDW: 13.5 % (ref 11.5–15.5)
WBC: 9.6 10*3/uL (ref 4.0–10.5)

## 2023-05-19 LAB — HEMOGLOBIN A1C: Hgb A1c MFr Bld: 11.7 % — ABNORMAL HIGH (ref 4.6–6.5)

## 2023-05-19 LAB — MICROALBUMIN / CREATININE URINE RATIO
Creatinine,U: 80.1 mg/dL
Microalb Creat Ratio: 2.1 mg/g (ref 0.0–30.0)
Microalb, Ur: 1.7 mg/dL (ref 0.0–1.9)

## 2023-05-19 MED ORDER — INSULIN GLARGINE 100 UNITS/ML SOLOSTAR PEN: 10.0000 [IU] | PEN_INJECTOR | Freq: Every day | SUBCUTANEOUS | 0 refills | Status: DC

## 2023-05-19 MED ORDER — INSULIN PEN NEEDLE 32G X 5 MM MISC: 0 refills | Status: DC

## 2023-05-19 MED ORDER — TIRZEPATIDE 2.5 MG/0.5ML ~~LOC~~ SOAJ
2.5000 mg | SUBCUTANEOUS | 0 refills | Status: DC
Start: 2023-05-19 — End: 2023-06-13

## 2023-05-19 NOTE — Telephone Encounter (Signed)
PA initiated via Covermymeds; KEY: [id number]. Awaiting determination.

## 2023-05-19 NOTE — Progress Notes (Signed)
Julie Dawson is a 63 y.o. 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 (HCC) 05/19/2023   Chronic constipation 05/05/2020   Tinea manus 12/03/2019   Allergic contact dermatitis due to metals 12/03/2019   Temporal pain 04/29/2019   Numbness of fingers of both hands 04/29/2019   Chronic bilateral low back pain without sciatica 02/04/2018   Cigarette nicotine dependence with nicotine-induced disorder 07/13/2017   Type 2 diabetes mellitus with complication, without long-term current use of insulin (HCC) 08/23/2016   PPD positive, treated 03/14/2013   Nonspecific reaction to tuberculin skin test without active tuberculosis 06/08/2010   INSOMNIA 12/31/2009   Herpes simplex virus (HSV) infection 01/21/2009   ONYCHOMYCOSIS 07/29/2008   HYPERCHOLESTEROLEMIA 07/29/2008   Dental caries 04/21/2008   OBESITY 03/13/2008   MENOPAUSAL SYNDROME 03/13/2008   Headache 03/13/2008   DRUG ABUSE, HX OF 03/13/2008    Current Outpatient Medications  Medication Sig Dispense Refill   acyclovir (ZOVIRAX) 200 MG capsule Take 1 capsule (200 mg total) by mouth 5 (five) times daily. For up to 3 days for cold sore 30 capsule 1   atorvastatin (LIPITOR) 40 MG tablet Take 1 tablet (40 mg total) by mouth daily. 7 tablet 0   Blood Glucose Monitoring Suppl DEVI 1 each by Does not apply route in the morning, at noon, and at bedtime. May substitute to any manufacturer covered by patient's insurance. 1 each 0   HYDROcodone-acetaminophen (NORCO) 5-325 MG tablet Take 1-2 tablets by mouth every 6 (six) hours as needed. 12 tablet 0   methocarbamol (ROBAXIN-750) 750 MG tablet Take 1 tablet (750 mg total) by mouth 2 (two) times daily as needed for muscle spasms. 20 tablet 1   pregabalin (LYRICA) 75 MG capsule Take 1 capsule (75 mg total) by mouth 2 (two) times daily. 60 capsule 1   tirzepatide  (MOUNJARO) 2.5 MG/0.5ML Pen Inject 2.5 mg into the skin once a week. 2 mL 0   No current facility-administered medications for this visit.    Allergies: Ace inhibitors, Jardiance [empagliflozin], Lisinopril, and Metformin and related  Past Medical History:  Diagnosis Date   Allergy    spring time only    Constipation    doesnt use otc medicines   Diabetes mellitus without complication (HCC)    History of positive PPD    cannot do the tine test,  has to do CXR   Hyperlipidemia     Past Surgical History:  Procedure Laterality Date   cervical adenitis node remova     ECTOPIC PREGNANCY SURGERY     MOUTH SURGERY     for wisdom teeth removal    Family History  Problem Relation Age of Onset   Diabetes Mother    Hypertension Mother    COPD Mother    Stroke Father    Diabetes Father    Hypertension Father    Diabetes Sister    Alcohol abuse Sister    Drug abuse Brother    Colon cancer Neg Hx    Colon polyps Neg Hx    Esophageal cancer Neg Hx    Rectal cancer Neg Hx    Stomach cancer Neg Hx    Breast cancer Neg Hx     Social History   Tobacco Use   Smoking status: Former    Current packs/day: 0.00    Types: Cigarettes  Quit date: 05/12/2023    Years since quitting: 0.0   Smokeless tobacco: Former    Quit date: 05/12/2023  Substance Use Topics   Alcohol use: No    Subjective:   Follow up on Type 2 Diabetes; is asking to go back on Mounjaro; has recently quit smoking and is concerned that is snack eating; in the past, there was concern that patient had poor tolerance to [facility] but she has since decided there was another source of her symptoms; she is very interested in re-starting the medication; does not feel that she is doing well on Jardiance;    Objective:  Vitals:   05/19/23 0959  BP: 138/70  Pulse: 79  SpO2: 99%  Weight: 160 lb 3.2 oz (72.7 kg)  Height: 5\' 3"  (1.6 m)    General: Well developed, well nourished, in no acute distress  Skin : Warm and dry.   Head: Normocephalic and atraumatic  Eyes: Sclera and conjunctiva clear; pupils round and reactive to light; extraocular movements intact  Ears: External normal; canals clear; tympanic membranes normal  Oropharynx: Pink, supple. No suspicious lesions  Neck: Supple without thyromegaly, adenopathy  Lungs: Respirations unlabored; clear to auscultation bilaterally without wheeze, rales, rhonchi  CVS exam: normal rate and regular rhythm.  Neurologic: Alert and oriented; speech intact; face symmetrical; moves all extremities well; CNII-XII intact without focal deficit   Assessment:  1. Type 2 diabetes mellitus with complication, without long-term current use of insulin (HCC)   2. Diabetes due to undrl condition w oth diabetic neuro comp (HCC) Chronic  3. Diabetes mellitus treated with injections of non-insulin medication (HCC)     Plan:  Update CBC, CMP, Hgba1c today; will d/c Jardiance per patient request; re-start Mounjaro 2.5 mg weekly; she will call back in 3 weeks with response and will plan to increase dosage at that time.   No follow-ups on file.  Orders Placed This Encounter  Procedures   CBC with Differential/Platelet   Comp Met (CMET)   Hemoglobin A1c   Urine Microalbumin w/creat. ratio    Requested Prescriptions   Signed Prescriptions Disp Refills   tirzepatide (MOUNJARO) 2.5 MG/0.5ML Pen 2 mL 0    Sig: Inject 2.5 mg into the skin once a week.

## 2023-05-19 NOTE — Progress Notes (Signed)
Spoke with patient regarding high glucose reading; she had forgotten to tell us earlier today that she had been taking high doses of prednisone recently per her orthopedist; will use daily dose of insulin ( 10 units of Lantus) for coverage until PA approved for [facility]; will check on patient on Monday; she is comfortable with this plan.

## 2023-05-23 ENCOUNTER — Encounter: Payer: Self-pay | Admitting: Family

## 2023-05-23 NOTE — Telephone Encounter (Signed)
PA approved.   21-JAN-25:21-JAN-26 Mounjaro 2.5MG /0.5ML [place] SOAJ Quantity:2;

## 2023-05-24 ENCOUNTER — Encounter: Payer: Self-pay | Admitting: Family

## 2023-05-26 ENCOUNTER — Encounter: Payer: Self-pay | Admitting: Family

## 2023-05-29 ENCOUNTER — Ambulatory Visit: Payer: BC Managed Care – PPO | Admitting: Orthopedic Surgery

## 2023-06-04 ENCOUNTER — Emergency Department (HOSPITAL_BASED_OUTPATIENT_CLINIC_OR_DEPARTMENT_OTHER)
Admission: EM | Admit: 2023-06-04 | Discharge: 2023-06-04 | Disposition: A | Discharge status: Home / Self Care | Payer: BC Managed Care – PPO | Attending: Emergency Medicine | Admitting: Emergency Medicine

## 2023-06-04 ENCOUNTER — Encounter (HOSPITAL_BASED_OUTPATIENT_CLINIC_OR_DEPARTMENT_OTHER): Payer: Self-pay

## 2023-06-04 ENCOUNTER — Other Ambulatory Visit: Payer: Self-pay

## 2023-06-04 ENCOUNTER — Emergency Department (HOSPITAL_BASED_OUTPATIENT_CLINIC_OR_DEPARTMENT_OTHER): Payer: BC Managed Care – PPO

## 2023-06-04 DIAGNOSIS — Z87891 Personal history of nicotine dependence: Secondary | ICD-10-CM | POA: Insufficient documentation

## 2023-06-04 DIAGNOSIS — Z794 Long term (current) use of insulin: Secondary | ICD-10-CM | POA: Insufficient documentation

## 2023-06-04 DIAGNOSIS — R519 Headache, unspecified: Secondary | ICD-10-CM | POA: Diagnosis not present

## 2023-06-04 DIAGNOSIS — R270 Ataxia, unspecified: Secondary | ICD-10-CM | POA: Diagnosis not present

## 2023-06-04 DIAGNOSIS — S0990XA Unspecified injury of head, initial encounter: Secondary | ICD-10-CM | POA: Diagnosis not present

## 2023-06-04 LAB — CBC WITH DIFFERENTIAL/PLATELET
Abs Immature Granulocytes: 0.02 10*3/uL (ref 0.00–0.07)
Basophils Absolute: 0.1 10*3/uL (ref 0.0–0.1)
Basophils Relative: 1 %
Eosinophils Absolute: 0.1 10*3/uL (ref 0.0–0.5)
Eosinophils Relative: 1 %
HCT: 36.3 % (ref 36.0–46.0)
Hemoglobin: 12.2 g/dL (ref 12.0–15.0)
Immature Granulocytes: 0 %
Lymphocytes Relative: 45 %
Lymphs Abs: 4.1 10*3/uL — ABNORMAL HIGH (ref 0.7–4.0)
MCH: 26.9 pg (ref 26.0–34.0)
MCHC: 33.6 g/dL (ref 30.0–36.0)
MCV: 80.1 fL (ref 80.0–100.0)
Monocytes Absolute: 0.6 10*3/uL (ref 0.1–1.0)
Monocytes Relative: 6 %
Neutro Abs: 4.3 10*3/uL (ref 1.7–7.7)
Neutrophils Relative %: 47 %
Platelets: 255 10*3/uL (ref 150–400)
RBC: 4.53 MIL/uL (ref 3.87–5.11)
RDW: 12.6 % (ref 11.5–15.5)
WBC: 9.2 10*3/uL (ref 4.0–10.5)
nRBC: 0 % (ref 0.0–0.2)

## 2023-06-04 LAB — BASIC METABOLIC PANEL
Anion gap: 9 (ref 5–15)
BUN: 19 mg/dL (ref 8–23)
CO2: 24 mmol/L (ref 22–32)
Calcium: 8.8 mg/dL — ABNORMAL LOW (ref 8.9–10.3)
Chloride: 107 mmol/L (ref 98–111)
Creatinine, Ser: 0.95 mg/dL (ref 0.44–1.00)
GFR, Estimated: >60 mL/min (ref >60)
Glucose, Bld: 128 mg/dL — ABNORMAL HIGH (ref 70–99)
Potassium: 3.5 mmol/L (ref 3.5–5.1)
Sodium: 140 mmol/L (ref 135–145)

## 2023-06-04 MED ORDER — SODIUM CHLORIDE 0.9 % IV BOLUS
1000.0000 mL | Freq: Once | INTRAVENOUS | Status: AC
  Administered 2023-06-04: 1000 mL via INTRAVENOUS

## 2023-06-04 MED ORDER — DIPHENHYDRAMINE HCL 25 MG PO CAPS
25.0000 mg | ORAL_CAPSULE | Freq: Once | ORAL | Status: AC
  Administered 2023-06-04: 25 mg via ORAL
  Filled 2023-06-04: qty 1

## 2023-06-04 MED ORDER — METOCLOPRAMIDE HCL 5 MG/ML IJ SOLN
10.0000 mg | Freq: Once | INTRAMUSCULAR | Status: AC
  Administered 2023-06-04: 10 mg via INTRAVENOUS
  Filled 2023-06-04: qty 2

## 2023-06-04 MED ORDER — KETOROLAC TROMETHAMINE 15 MG/ML IJ SOLN
15.0000 mg | Freq: Once | INTRAMUSCULAR | Status: AC
  Administered 2023-06-04: 15 mg via INTRAVENOUS
  Filled 2023-06-04: qty 1

## 2023-06-04 NOTE — ED Provider Notes (Signed)
[place] EMERGENCY DEPARTMENT AT [facility] Provider Note   CSN: [id number] Arrival date & time: 06/04/23  1729     History  Chief Complaint  Patient presents with   Headache    Julie Dawson is a 63 y.o. female.  63 year old female here today for headache.  Patient says that she started develop a headache while she was at church, she says that she noticed light bothers her.  Patient does not regularly get headaches.  She has been taking Tylenol at home.  Patient recently quit smoking.   Headache      Home Medications Prior to Admission medications   Medication Sig Start Date End Date Taking? Authorizing Provider  acyclovir (ZOVIRAX) 200 MG capsule Take 1 capsule (200 mg total) by mouth 5 (five) times daily. For up to 3 days for cold sore 12/14/22   Olive Bass, FNP  atorvastatin (LIPITOR) 40 MG tablet Take 1 tablet (40 mg total) by mouth daily. 11/10/22   Olive Bass, FNP  Blood Glucose Monitoring Suppl DEVI 1 each by Does not apply route in the morning, at noon, and at bedtime. May substitute to any manufacturer covered by patient's insurance. 06/03/22   Olive Bass, FNP  HYDROcodone-acetaminophen (NORCO) 5-325 MG tablet Take 1-2 tablets by mouth every 6 (six) hours as needed. 01/26/23   Geoffery Lyons, MD  insulin glargine (LANTUS) 100 unit/mL SOPN Inject 10 Units into the skin at bedtime. 05/19/23   Olive Bass, FNP  Insulin Pen Needle 32G X 5 MM MISC Use as directed to inject insulin daily 05/19/23   Olive Bass, FNP  methocarbamol (ROBAXIN-750) 750 MG tablet Take 1 tablet (750 mg total) by mouth 2 (two) times daily as needed for muscle spasms. 04/11/23   Cristie Hem, PA-C  pregabalin (LYRICA) 75 MG capsule Take 1 capsule (75 mg total) by mouth 2 (two) times daily. 04/24/23 06/23/23  London Sheer, MD  tirzepatide [facility]) 2.5 MG/0.5ML Pen Inject 2.5 mg into the skin once a week. 05/19/23   Olive Bass, FNP      Allergies    Ace inhibitors, Jardiance [empagliflozin], Lisinopril, and Metformin and related    Review of Systems   Review of Systems  Neurological:  Positive for headaches.    Physical Exam Updated Vital Signs BP (!) 154/95   Pulse 69   Temp 98.5 F (36.9 C) (Oral)   Resp 16   SpO2 99%  Physical Exam Vitals reviewed.  Constitutional:      General: She is not in acute distress.    Appearance: She is not toxic-appearing.  HENT:     Head: Normocephalic and atraumatic.  Eyes:     Extraocular Movements: Extraocular movements intact.     Pupils: Pupils are equal, round, and reactive to light.  Cardiovascular:     Rate and Rhythm: Normal rate.  Pulmonary:     Effort: Pulmonary effort is normal.  Musculoskeletal:     Cervical back: Normal range of motion and neck supple. No rigidity.  Neurological:     Mental Status: She is alert.     Cranial Nerves: No cranial nerve deficit or facial asymmetry.     Sensory: No sensory deficit.     Gait: Gait normal.  Psychiatric:        Mood and Affect: Mood normal.     ED Results / Procedures / Treatments   Labs (all labs ordered are listed, but only abnormal results are  displayed) Labs Reviewed  BASIC METABOLIC PANEL - Abnormal; Notable for the following components:      Result Value   Glucose, Bld 128 (*)    Calcium 8.8 (*)    All other components within normal limits  CBC WITH DIFFERENTIAL/PLATELET - Abnormal; Notable for the following components:   Lymphs Abs 4.1 (*)    All other components within normal limits    EKG None  Radiology CT Head Wo Contrast Result Date: 06/04/2023 CLINICAL DATA:  Ataxia, head trauma EXAM: CT HEAD WITHOUT CONTRAST TECHNIQUE: Contiguous axial images were obtained from the base of the skull through the vertex without intravenous contrast. RADIATION DOSE REDUCTION: This exam was performed according to the departmental dose-optimization program which includes automated exposure  control, adjustment of the mA and/or kV according to patient size and/or use of iterative reconstruction technique. COMPARISON:  CT head 05/08/2019. FINDINGS: Brain: No evidence of acute large vascular territory infarction, hemorrhage, hydrocephalus, extra-axial collection or mass lesion/mass effect. Vascular: No hyperdense vessel. Skull: No acute fracture. Sinuses/Orbits: Clear sinuses.  No acute orbital findings. Other: No mastoid effusions. IMPRESSION: Stable head CT.  No evidence of acute intracranial abnormality. Electronically Signed   By: Feliberto Harts M.D.   On: 06/04/2023 22:10    Procedures Procedures    Medications Ordered in ED Medications  metoCLOPramide (REGLAN) injection 10 mg (10 mg Intravenous Given 06/04/23 2126)  diphenhydrAMINE (BENADRYL) capsule 25 mg (25 mg Oral Given 06/04/23 2126)  sodium chloride 0.9 % bolus 1,000 mL (0 mLs Intravenous Stopped 06/04/23 2155)  ketorolac (TORADOL) 15 MG/ML injection 15 mg (15 mg Intravenous Given 06/04/23 2228)    ED Course/ Medical Decision Making/ A&P                                 Medical Decision Making 63 year old female here today for headache.  Differential diagnoses include tension type headache, migraine headache, cluster headache, intracranial hemorrhage.  Plan- with the patient's headache, will obtain CT imaging.  Patient without any neurological deficits, cranial nerves are intact, normal gait.  Less likely to be dissection, lower suspicion for subarachnoid hemorrhage.  There is no sudden onset, no thunderclap.  Will attempt to treat the patient's headache, check basic labs.  Considered angiography, but based on patient's exam and story do not believe it is indicated.  Reassessment 10:30 PM-my independent review the patient's head CT shows no intracranial hemorrhage.  Headache resolved with treatment.  Will provide some additional Toradol.  Will discharge patient, have her follow-up with her PCP.  Return precautions discussed  with the patient at bedside.  Patient's labs reviewed.  Normal renal function.  Amount and/or Complexity of Data Reviewed Labs: ordered. Radiology: ordered.  Risk Prescription drug management.           Final Clinical Impression(s) / ED Diagnoses Final diagnoses:  Nonintractable headache, unspecified chronicity pattern, unspecified headache type    Rx / DC Orders ED Discharge Orders     None         Arletha Pili, DO 06/04/23 2233

## 2023-06-04 NOTE — ED Triage Notes (Signed)
She c/o left-sided h/a which started "in church today" (~1100 hours); which persists, in spite of her taking Tylenol. She is ambulatory and alert and oriented x 4 with clear speech.

## 2023-06-04 NOTE — ED Notes (Signed)
 Report given to the next RN.Marland KitchenMarland Kitchen

## 2023-06-04 NOTE — Discharge Instructions (Addendum)
While you are in the emergency room you have blood work done that was normal.  Your head CT was also normal.  You received some medications here for headache.  Tomorrow, you can take Motrin and Tylenol for headache continues to occur.  Return the emergency room if you develop sudden worsening of your headache, difficulty with your vision, vomiting, or difficulty walking.

## 2023-06-05 ENCOUNTER — Encounter: Payer: Self-pay | Admitting: Family

## 2023-06-07 ENCOUNTER — Encounter (HOSPITAL_BASED_OUTPATIENT_CLINIC_OR_DEPARTMENT_OTHER): Payer: Self-pay

## 2023-06-07 ENCOUNTER — Telehealth: Payer: Self-pay

## 2023-06-07 ENCOUNTER — Encounter: Payer: Self-pay | Admitting: Family

## 2023-06-07 ENCOUNTER — Other Ambulatory Visit: Payer: Self-pay

## 2023-06-07 ENCOUNTER — Emergency Department (HOSPITAL_BASED_OUTPATIENT_CLINIC_OR_DEPARTMENT_OTHER)
Admission: EM | Admit: 2023-06-07 | Discharge: 2023-06-07 | Disposition: A | Discharge status: Home / Self Care | Payer: BC Managed Care – PPO | Attending: Emergency Medicine | Admitting: Emergency Medicine

## 2023-06-07 DIAGNOSIS — J0100 Acute maxillary sinusitis, unspecified: Secondary | ICD-10-CM | POA: Insufficient documentation

## 2023-06-07 DIAGNOSIS — R519 Headache, unspecified: Secondary | ICD-10-CM | POA: Diagnosis not present

## 2023-06-07 MED ORDER — AMOXICILLIN-POT CLAVULANATE 875-125 MG PO TABS: 1.0000 | ORAL_TABLET | Freq: Two times a day (BID) | ORAL | 0 refills | Status: DC

## 2023-06-07 MED ORDER — AMOXICILLIN-POT CLAVULANATE 875-125 MG PO TABS
1.0000 | ORAL_TABLET | Freq: Once | ORAL | Status: AC
  Administered 2023-06-07: 1 via ORAL
  Filled 2023-06-07: qty 1

## 2023-06-07 MED ORDER — DEXAMETHASONE 4 MG PO TABS
10.0000 mg | ORAL_TABLET | Freq: Once | ORAL | Status: AC
  Administered 2023-06-07: 10 mg via ORAL
  Filled 2023-06-07: qty 3

## 2023-06-07 NOTE — Telephone Encounter (Signed)
 Copied from CRM [id number]. Topic: Clinical - Prescription Issue >> Jun 07, 2023 11:53 AM Burnard DEL wrote: Reason for CRM: went ot ER and they prescribed medicine for her eye (amoxicillin -clavulanate (AUGMENTIN ) 875-125 MG tablet)She stated that the medicine is costing her 40 dollars to purchase and she's not able to afford.She would like to know if another medication that maybe cheaper would be prescribed to her?   [id number] - [place] ([place]), [place] - [address]  Phone: [phone] Fax: [phone]

## 2023-06-07 NOTE — Discharge Instructions (Signed)
 Take tylenol 2 pills 4 times a day and motrin 4 pills 3 times a day.  Drink plenty of fluids.  Return for worsening shortness of breath, headache, confusion. Follow up with your family doctor.

## 2023-06-07 NOTE — ED Triage Notes (Signed)
 Pt states that she has a headache that started Monday and blurry vision that started tonight. Seen the other day for the same and given headache cocktail without relief

## 2023-06-07 NOTE — ED Provider Notes (Signed)
 [place] EMERGENCY DEPARTMENT AT [facility] Provider Note   CSN: [id number] Arrival date & time: 06/07/23  0210     History  Chief Complaint  Patient presents with   Headache    Julie Dawson is a 63 y.o. female.  63 yo F with a chief complaints of a headache.  This is mostly left-sided.  Been going on for a few days now.  She was seen in the emergency department at the onset of her illness and had blood work and CT imaging.  She is given a headache cocktail and she said it temporarily improved her symptoms but then came right back.  Worse with bright lights and loud noises.  She feels like the skin on the left side of her face is very tender and if she even touches that side it tends to hurt.  She denies rash.   Headache      Home Medications Prior to Admission medications   Medication Sig Start Date End Date Taking? Authorizing Provider  amoxicillin -clavulanate (AUGMENTIN ) 875-125 MG tablet Take 1 tablet by mouth every 12 (twelve) hours. 06/07/23  Yes Emil Share, DO  acyclovir  (ZOVIRAX ) 200 MG capsule Take 1 capsule (200 mg total) by mouth 5 (five) times daily. For up to 3 days for cold sore 12/14/22   Jason Leita Repine, FNP  atorvastatin  (LIPITOR) 40 MG tablet Take 1 tablet (40 mg total) by mouth daily. 11/10/22   Jason Leita Repine, FNP  Blood Glucose Monitoring Suppl DEVI 1 each by Does not apply route in the morning, at noon, and at bedtime. May substitute to any manufacturer covered by patient's insurance. 06/03/22   Jason Leita Repine, FNP  HYDROcodone -acetaminophen  (NORCO) 5-325 MG tablet Take 1-2 tablets by mouth every 6 (six) hours as needed. 01/26/23   Geroldine Berg, MD  insulin  glargine (LANTUS ) 100 unit/mL SOPN Inject 10 Units into the skin at bedtime. 05/19/23   Jason Leita Repine, FNP  Insulin  Pen Needle 32G X 5 MM MISC Use as directed to inject insulin  daily 05/19/23   Jason Leita Repine, FNP  methocarbamol  (ROBAXIN -750) 750 MG tablet  Take 1 tablet (750 mg total) by mouth 2 (two) times daily as needed for muscle spasms. 04/11/23   Jule Ronal CROME, PA-C  pregabalin  (LYRICA ) 75 MG capsule Take 1 capsule (75 mg total) by mouth 2 (two) times daily. 04/24/23 06/23/23  Georgina Ozell LABOR, MD  tirzepatide  (MOUNJARO ) 2.5 MG/0.5ML Pen Inject 2.5 mg into the skin once a week. 05/19/23   Jason Leita Repine, FNP      Allergies    Ace inhibitors, Jardiance  [empagliflozin ], Lisinopril , and Metformin  and related    Review of Systems   Review of Systems  Neurological:  Positive for headaches.    Physical Exam Updated Vital Signs BP (!) 148/84   Pulse 81   Temp 97.8 F (36.6 C)   Resp 18   SpO2 100%  Physical Exam Vitals and nursing note reviewed.  Constitutional:      General: She is not in acute distress.    Appearance: She is well-developed. She is not diaphoretic.  HENT:     Head: Normocephalic and atraumatic.     Comments: Patient with swollen turbinates posterior nasal drip exquisite sinus tenderness to percussion on the left.  She has a posterior auricular lymph node that is swollen and tender.  Left TM with effusion. Eyes:     Pupils: Pupils are equal, round, and reactive to light.  Cardiovascular:  Rate and Rhythm: Normal rate and regular rhythm.     Heart sounds: No murmur heard.    No friction rub. No gallop.  Pulmonary:     Effort: Pulmonary effort is normal.     Breath sounds: No wheezing or rales.  Abdominal:     General: There is no distension.     Palpations: Abdomen is soft.     Tenderness: There is no abdominal tenderness.  Musculoskeletal:        General: No tenderness.     Cervical back: Normal range of motion and neck supple.  Skin:    General: Skin is warm and dry.  Neurological:     Mental Status: She is alert and oriented to person, place, and time.     Cranial Nerves: Cranial nerves 2-12 are intact.     Sensory: Sensation is intact.     Motor: Motor function is intact.      Coordination: Coordination is intact.     Comments: Benign neuro exam  Psychiatric:        Behavior: Behavior normal.     ED Results / Procedures / Treatments   Labs (all labs ordered are listed, but only abnormal results are displayed) Labs Reviewed - No data to display  EKG None  Radiology No results found.  Procedures Procedures    Medications Ordered in ED Medications  amoxicillin -clavulanate (AUGMENTIN ) 875-125 MG per tablet 1 tablet (1 tablet Oral Given 06/07/23 0602)  dexamethasone  (DECADRON ) tablet 10 mg (10 mg Oral Given 06/07/23 0603)    ED Course/ Medical Decision Making/ A&P                                 Medical Decision Making Risk Prescription drug management.   63 yo F with a chief complaint of a headache.  This has been ongoing for about 3 to 4 days now.  Clinically the patient has sinusitis.  She had been seen in the ED a couple days ago and had CT imaging that was negative for acute intracranial process.  It sounds like he has been having some trouble with blurry vision.  She said it is worse with driving a car especially at night.  I am not sure how acute this symptom is.  I did offer to send her to [facility] for an MRI.  She is declining at this time.  She has a follow-up appointment with her ophthalmologist in 3 days.  Will trial course of antibiotics.  A single dose of steroids.  PCP follow-up.  6:18 AM:  I have discussed the diagnosis/risks/treatment options with the patient and family.  Evaluation and diagnostic testing in the emergency department does not suggest an emergent condition requiring admission or immediate intervention beyond what has been performed at this time.  They will follow up with PCP. We also discussed returning to the ED immediately if new or worsening sx occur. We discussed the sx which are most concerning (e.g., sudden worsening pain, fever, inability to tolerate by mouth) that necessitate immediate return. Medications  administered to the patient during their visit and any new prescriptions provided to the patient are listed below.  Medications given during this visit Medications  amoxicillin -clavulanate (AUGMENTIN ) 875-125 MG per tablet 1 tablet (1 tablet Oral Given 06/07/23 0602)  dexamethasone  (DECADRON ) tablet 10 mg (10 mg Oral Given 06/07/23 0603)     The patient appears reasonably screen and/or stabilized for discharge  and I doubt any other medical condition or other [place] requiring further screening, evaluation, or treatment in the ED at this time prior to discharge.          Final Clinical Impression(s) / ED Diagnoses Final diagnoses:  Acute maxillary sinusitis, recurrence not specified    Rx / DC Orders ED Discharge Orders          Ordered    amoxicillin -clavulanate (AUGMENTIN ) 875-125 MG tablet  Every 12 hours        06/07/23 0551              Brittney Caraway, DO 06/07/23 9381

## 2023-06-07 NOTE — ED Notes (Signed)
 ED Provider at bedside.

## 2023-06-13 ENCOUNTER — Other Ambulatory Visit: Payer: Self-pay | Admitting: Family

## 2023-06-13 ENCOUNTER — Encounter: Payer: Self-pay | Admitting: Family

## 2023-06-13 MED ORDER — INSULIN GLARGINE 100 UNITS/ML SOLOSTAR PEN: 10.0000 [IU] | PEN_INJECTOR | Freq: Every day | SUBCUTANEOUS | 0 refills | Status: DC

## 2023-06-13 MED ORDER — TIRZEPATIDE 5 MG/0.5ML ~~LOC~~ SOAJ: 5.0000 mg | SUBCUTANEOUS | 0 refills | Status: DC

## 2023-06-14 ENCOUNTER — Encounter: Payer: Self-pay | Admitting: Family

## 2023-06-20 ENCOUNTER — Telehealth: Payer: Self-pay | Admitting: Family

## 2023-06-20 ENCOUNTER — Ambulatory Visit (INDEPENDENT_AMBULATORY_CARE_PROVIDER_SITE_OTHER): Payer: BC Managed Care – PPO | Admitting: Family

## 2023-06-20 ENCOUNTER — Encounter: Payer: Self-pay | Admitting: Family

## 2023-06-20 ENCOUNTER — Other Ambulatory Visit: Payer: Self-pay | Admitting: Family

## 2023-06-20 VITALS — BP 132/84 | HR 99 | Ht 63.0 in | Wt 163.4 lb

## 2023-06-20 DIAGNOSIS — R519 Headache, unspecified: Secondary | ICD-10-CM | POA: Diagnosis not present

## 2023-06-20 DIAGNOSIS — M791 Myalgia, unspecified site: Secondary | ICD-10-CM

## 2023-06-20 DIAGNOSIS — J329 Chronic sinusitis, unspecified: Secondary | ICD-10-CM

## 2023-06-20 LAB — COMPREHENSIVE METABOLIC PANEL
ALT: 27 U/L (ref 0–35)
AST: 19 U/L (ref 0–37)
Albumin: 4.4 g/dL (ref 3.5–5.2)
Alkaline Phosphatase: 129 U/L — ABNORMAL HIGH (ref 39–117)
BUN: 24 mg/dL — ABNORMAL HIGH (ref 6–23)
CO2: 24 meq/L (ref 19–32)
Calcium: 9.2 mg/dL (ref 8.4–10.5)
Chloride: 105 meq/L (ref 96–112)
Creatinine, Ser: 1.28 mg/dL — ABNORMAL HIGH (ref 0.40–1.20)
GFR: 44.94 mL/min — ABNORMAL LOW (ref >60.00)
Glucose, Bld: 180 mg/dL — ABNORMAL HIGH (ref 70–99)
Potassium: 4.3 meq/L (ref 3.5–5.1)
Sodium: 138 meq/L (ref 135–145)
Total Bilirubin: 0.7 mg/dL (ref 0.2–1.2)
Total Protein: 7 g/dL (ref 6.0–8.3)

## 2023-06-20 LAB — C-REACTIVE PROTEIN: CRP: 1.5 mg/dL (ref 0.5–20.0)

## 2023-06-20 LAB — SEDIMENTATION RATE: Sed Rate: 46 mm/h — ABNORMAL HIGH (ref 0–30)

## 2023-06-20 MED ORDER — BLOOD GLUCOSE MONITORING SUPPL DEVI: 1.0000 | Freq: Three times a day (TID) | 0 refills | Status: DC

## 2023-06-20 MED ORDER — BLOOD GLUCOSE TEST VI STRP: 1.0000 | ORAL_STRIP | Freq: Three times a day (TID) | 0 refills | Status: AC

## 2023-06-20 MED ORDER — LANCETS MISC. MISC: 1.0000 | Freq: Three times a day (TID) | 0 refills | Status: AC

## 2023-06-20 MED ORDER — LANCET DEVICE MISC: 1.0000 | Freq: Three times a day (TID) | 0 refills | Status: AC

## 2023-06-20 MED ORDER — PREDNISONE 20 MG PO TABS: 20.0000 mg | ORAL_TABLET | Freq: Every day | ORAL | 0 refills | Status: DC

## 2023-06-20 NOTE — Progress Notes (Signed)
Julie Dawson is a 63 y.o. 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 (HCC) 05/19/2023   Chronic constipation 05/05/2020   Tinea manus 12/03/2019   Allergic contact dermatitis due to metals 12/03/2019   Temporal pain 04/29/2019   Numbness of fingers of both hands 04/29/2019   Chronic bilateral low back pain without sciatica 02/04/2018   Cigarette nicotine dependence with nicotine-induced disorder 07/13/2017   Type 2 diabetes mellitus with complication, without long-term current use of insulin (HCC) 08/23/2016   PPD positive, treated 03/14/2013   Nonspecific reaction to tuberculin skin test without active tuberculosis 06/08/2010   INSOMNIA 12/31/2009   Herpes simplex virus (HSV) infection 01/21/2009   ONYCHOMYCOSIS 07/29/2008   HYPERCHOLESTEROLEMIA 07/29/2008   Dental caries 04/21/2008   OBESITY 03/13/2008   MENOPAUSAL SYNDROME 03/13/2008   Headache 03/13/2008   DRUG ABUSE, HX OF 03/13/2008    Current Outpatient Medications  Medication Sig Dispense Refill   acyclovir (ZOVIRAX) 200 MG capsule Take 1 capsule (200 mg total) by mouth 5 (five) times daily. For up to 3 days for cold sore 30 capsule 1   atorvastatin (LIPITOR) 40 MG tablet Take 1 tablet (40 mg total) by mouth daily. 7 tablet 0   Blood Glucose Monitoring Suppl DEVI 1 each by Does not apply route in the morning, at noon, and at bedtime. May substitute to any manufacturer covered by patient's insurance. 1 each 0   Glucose Blood (BLOOD GLUCOSE TEST STRIPS) STRP 1 each by In Vitro route in the morning, at noon, and at bedtime. May substitute to any manufacturer covered by patient's insurance. 100 strip 0   HYDROcodone-acetaminophen (NORCO) 5-325 MG tablet Take 1-2 tablets by mouth every 6 (six) hours as needed. 12 tablet 0   insulin glargine (LANTUS) 100 unit/mL SOPN Inject 10 Units into the skin at  bedtime. 15 mL 0   Insulin Pen Needle 32G X 5 MM MISC Use as directed to inject insulin daily 50 each 0   Lancet Device MISC 1 each by Does not apply route in the morning, at noon, and at bedtime. May substitute to any manufacturer covered by patient's insurance. 1 each 0   Lancets Misc. MISC 1 each by Does not apply route in the morning, at noon, and at bedtime. May substitute to any manufacturer covered by patient's insurance. 100 each 0   methocarbamol (ROBAXIN-750) 750 MG tablet Take 1 tablet (750 mg total) by mouth 2 (two) times daily as needed for muscle spasms. 20 tablet 1   pregabalin (LYRICA) 75 MG capsule Take 1 capsule (75 mg total) by mouth 2 (two) times daily. 60 capsule 1   tirzepatide (MOUNJARO) 5 MG/0.5ML Pen Inject 5 mg into the skin once a week. 2 mL 0   Blood Glucose Monitoring Suppl DEVI 1 each by Does not apply route in the morning, at noon, and at bedtime. May substitute to any manufacturer covered by patient's insurance. 1 each 0   No current facility-administered medications for this visit.    Allergies: Ace inhibitors, Jardiance [empagliflozin], Lisinopril, and Metformin and related  Past Medical History:  Diagnosis Date   Allergy    spring time only    Constipation    doesnt use otc medicines   Diabetes mellitus without complication (HCC)    History of positive PPD    cannot do the tine test,  has  to do CXR   Hyperlipidemia     Past Surgical History:  Procedure Laterality Date   cervical adenitis node remova     ECTOPIC PREGNANCY SURGERY     MOUTH SURGERY     for wisdom teeth removal    Family History  Problem Relation Age of Onset   Diabetes Mother    Hypertension Mother    COPD Mother    Stroke Father    Diabetes Father    Hypertension Father    Diabetes Sister    Alcohol abuse Sister    Drug abuse Brother    Colon cancer Neg Hx    Colon polyps Neg Hx    Esophageal cancer Neg Hx    Rectal cancer Neg Hx    Stomach cancer Neg Hx    Breast cancer  Neg Hx     Social History   Tobacco Use   Smoking status: Former    Current packs/day: 0.00    Types: Cigarettes    Quit date: 05/12/2023    Years since quitting: 0.1   Smokeless tobacco: Former    Quit date: 05/12/2023  Substance Use Topics   Alcohol use: No    Subjective:   Presents with concerns for recurrent sinus issues; seen at the ER on 06/07/23 with acute sinus infection- was treated with course of Augmentin; does note that she is having chronic dental issues and is actively in the process of trying to get established with a new dentist; does not feel that dentures are fitting properly; does feel that sinus pain/ pressure is localized more on left side of her head; had a normal head CT on 06/04/23;   Objective:  Vitals:   06/20/23 0758  BP: 132/84  Pulse: 99  SpO2: 97%  Weight: 163 lb 6.4 oz (74.1 kg)  Height: 5\' 3"  (1.6 m)    General: Well developed, well nourished, in no acute distress  Skin : Warm and dry.  Head: Normocephalic and atraumatic  Eyes: Sclera and conjunctiva clear; pupils round and reactive to light; extraocular movements intact  Ears: External normal; canals clear; tympanic membranes normal  Oropharynx: Pink, supple. No suspicious lesions  Neck: Supple without thyromegaly, adenopathy  Lungs: Respirations unlabored; clear to auscultation bilaterally without wheeze, rales, rhonchi  CVS exam: normal rate and regular rhythm.  Neurologic: Alert and oriented; speech intact; face symmetrical; moves all extremities well; CNII-XII intact without focal deficit   Assessment:  1. Recurrent sinus infections   2. Nonintractable headache, unspecified chronicity pattern, unspecified headache type     Plan:  ? Related to underlying dental issues; check sinus CT; recommend that patient follow up with her dentist as soon as possible; she just completed a course of antibiotics in the past few days;  Check sed rate, CRP today; follow up to be determined.   No follow-ups  on file.  Orders Placed This Encounter  Procedures   CT MAXILLOFACIAL WO CONTRAST    Standing Status:   Future    Expiration Date:   06/19/2024    Preferred imaging location?:   [facility]   Comp Met (CMET)   Sedimentation rate   C-reactive protein    Requested Prescriptions   Signed Prescriptions Disp Refills   Blood Glucose Monitoring Suppl DEVI 1 each 0    Sig: 1 each by Does not apply route in the morning, at noon, and at bedtime. May substitute to any manufacturer covered by patient's insurance.   Glucose Blood (BLOOD GLUCOSE  TEST STRIPS) STRP 100 strip 0    Sig: 1 each by In Vitro route in the morning, at noon, and at bedtime. May substitute to any manufacturer covered by patient's insurance.   Lancet Device MISC 1 each 0    Sig: 1 each by Does not apply route in the morning, at noon, and at bedtime. May substitute to any manufacturer covered by patient's insurance.   Lancets Misc. MISC 100 each 0    Sig: 1 each by Does not apply route in the morning, at noon, and at bedtime. May substitute to any manufacturer covered by patient's insurance.   Blood Glucose Monitoring Suppl DEVI 1 each 0    Sig: 1 each by Does not apply route in the morning, at noon, and at bedtime. May substitute to any manufacturer covered by patient's insurance.

## 2023-06-20 NOTE — Telephone Encounter (Signed)
Spoke with patient- she is aware; she will call to get scheduled for lab appointment on Friday as discussed; she is aware that prednisone prescription at pharmacy as well. Follow up to be determined.

## 2023-06-20 NOTE — Telephone Encounter (Signed)
Please make sure she saw my message to come get repeat labs done. She has had so many vague symptoms with the headaches, vision changes that I want to make sure we are not missing something else. I am going to start her on a low dose of prednisone for the next few days to see if this helps. I am purposely using a low dose since her blood sugars are not controlled.

## 2023-06-21 NOTE — Telephone Encounter (Signed)
Pt sent mychart stating she will be going to [place] on Friday

## 2023-06-22 ENCOUNTER — Other Ambulatory Visit: Payer: BC Managed Care – PPO

## 2023-06-22 DIAGNOSIS — Z205 Contact with and (suspected) exposure to viral hepatitis: Secondary | ICD-10-CM | POA: Diagnosis not present

## 2023-06-22 DIAGNOSIS — R519 Headache, unspecified: Secondary | ICD-10-CM

## 2023-06-22 DIAGNOSIS — M791 Myalgia, unspecified site: Secondary | ICD-10-CM | POA: Diagnosis not present

## 2023-06-22 LAB — TSH: TSH: 0.98 u[IU]/mL (ref 0.35–5.50)

## 2023-06-22 LAB — C-REACTIVE PROTEIN: CRP: 1.7 mg/dL (ref 0.5–20.0)

## 2023-06-22 LAB — SEDIMENTATION RATE: Sed Rate: 43 mm/h — ABNORMAL HIGH (ref 0–30)

## 2023-06-22 LAB — CK: Total CK: 73 U/L (ref 7–177)

## 2023-06-23 ENCOUNTER — Ambulatory Visit (HOSPITAL_BASED_OUTPATIENT_CLINIC_OR_DEPARTMENT_OTHER)
Admission: RE | Admit: 2023-06-23 | Discharge: 2023-06-23 | Disposition: A | Discharge status: Home / Self Care | Payer: BC Managed Care – PPO | Source: Ambulatory Visit | Attending: Family | Admitting: Family

## 2023-06-23 ENCOUNTER — Other Ambulatory Visit: Payer: BC Managed Care – PPO

## 2023-06-23 ENCOUNTER — Encounter: Payer: Self-pay | Admitting: Family

## 2023-06-23 DIAGNOSIS — J329 Chronic sinusitis, unspecified: Secondary | ICD-10-CM

## 2023-06-23 LAB — RHEUMATOID FACTOR: Rheumatoid fact SerPl-aCnc: <10 [IU]/mL (ref <14)

## 2023-06-23 LAB — HEPATITIS C ANTIBODY: Hepatitis C Ab: NONREACTIVE

## 2023-06-23 LAB — ANA: Anti Nuclear Antibody (ANA): NEGATIVE

## 2023-07-06 ENCOUNTER — Encounter: Payer: Self-pay | Admitting: Family

## 2023-07-07 ENCOUNTER — Encounter: Payer: Self-pay | Admitting: Family

## 2023-07-07 ENCOUNTER — Other Ambulatory Visit: Payer: Self-pay | Admitting: Family

## 2023-07-07 MED ORDER — TIRZEPATIDE 7.5 MG/0.5ML ~~LOC~~ SOAJ: 7.5000 mg | SUBCUTANEOUS | 0 refills | Status: DC

## 2023-07-12 ENCOUNTER — Telehealth: Payer: Self-pay

## 2023-07-12 NOTE — Telephone Encounter (Signed)
 Pt is needing a PA for Mounjaro 7.5 mg please. Thank you!

## 2023-07-13 ENCOUNTER — Other Ambulatory Visit (HOSPITAL_COMMUNITY): Payer: Self-pay

## 2023-07-13 ENCOUNTER — Telehealth: Payer: Self-pay | Admitting: Pharmacy Technician

## 2023-07-13 NOTE — Telephone Encounter (Signed)
.  Pharmacy Patient Advocate Encounter  Received notification from  [facility]  that Prior Authorization for [facility] 7.5MG  has been ADDED TO HER PREVIOUS PA AND IS APPROVED, HOWEVER:    PA #/Case ID/Reference #: [id number]

## 2023-07-25 ENCOUNTER — Other Ambulatory Visit: Payer: Self-pay | Admitting: Family

## 2023-07-25 ENCOUNTER — Encounter: Payer: Self-pay | Admitting: Family

## 2023-07-25 ENCOUNTER — Ambulatory Visit: Payer: Self-pay

## 2023-07-25 ENCOUNTER — Other Ambulatory Visit (INDEPENDENT_AMBULATORY_CARE_PROVIDER_SITE_OTHER)

## 2023-07-25 DIAGNOSIS — Z794 Long term (current) use of insulin: Secondary | ICD-10-CM | POA: Diagnosis not present

## 2023-07-25 DIAGNOSIS — E1169 Type 2 diabetes mellitus with other specified complication: Secondary | ICD-10-CM

## 2023-07-25 DIAGNOSIS — R899 Unspecified abnormal finding in specimens from other organs, systems and tissues: Secondary | ICD-10-CM

## 2023-07-25 DIAGNOSIS — R519 Headache, unspecified: Secondary | ICD-10-CM

## 2023-07-25 LAB — SEDIMENTATION RATE: Sed Rate: 29 mm/h (ref 0–30)

## 2023-07-25 LAB — HEMOGLOBIN A1C: Hgb A1c MFr Bld: 8.6 % — ABNORMAL HIGH (ref 4.6–6.5)

## 2023-07-25 NOTE — Telephone Encounter (Signed)
PCP is aware  

## 2023-07-25 NOTE — Telephone Encounter (Signed)
 Chief Complaint: Headache Symptoms: neck stiffness, Frequency: began yesterday Pertinent Negatives: Patient denies fever, vision changes Disposition: [] ED /[] Urgent Care (no appt availability in office) / [] Appointment(In office/virtual)/ []  [facility]/ [] Home Care/ [] Refused Recommended Disposition /[] [facility]/ [x]  Follow-up with PCP Additional Notes: Pt called in per provider's MyChart message advising she be triaged to ensure symptoms were same as previous HA. Pt advised symptoms feel the same as previous HA, stiff neck however pt can touch chin to chest. Pt reports using Motrin, OTC sinus medication with little to no relief. Advised appt for treatment of acute HA and pt declines appt. Reports she will get labs today as advised by provider and await neurology referral. This RN educated pt on home care, new-worsening symptoms, when to call back/seek emergent care. Pt verbalized understanding and agrees to plan.    Copied from CRM [id number]. Topic: Clinical - Red Word Triage >> Jul 25, 2023 10:27 AM Fredrich Romans wrote: Red Word that prompted transfer to Nurse Triage: stiff neck and headache Reason for Disposition  [1] SEVERE headache (e.g., excruciating) AND [2] not improved after 2 hours of pain medicine  Answer Assessment - Initial Assessment Questions 1. LOCATION: "Where does it hurt?"      Eyes, nasal passages, forehead 2. ONSET: "When did the headache start?" (Minutes, hours or days)      Yesterday 3. PATTERN: "Does the pain come and go, or has it been constant since it started?"     Constant 4. SEVERITY: "How bad is the pain?" and "What does it keep you from doing?"  (e.g., Scale 1-10; mild, moderate, or severe)   - MILD (1-3): doesn't interfere with normal activities    - MODERATE (4-7): interferes with normal activities or awakens from sleep    - SEVERE (8-10): excruciating pain, unable to do any normal activities        8/10 6. CAUSE: "What do you think is  causing the headache?"     Unknown 7. MIGRAINE: "Have you been diagnosed with migraine headaches?" If Yes, ask: "Is this headache similar?"      None  9. OTHER SYMPTOMS: "Do you have any other symptoms?" (fever, stiff neck, eye pain, sore throat, cold symptoms)     Neck stiffness  Protocols used: Headache-A-AH

## 2023-08-03 ENCOUNTER — Telehealth: Payer: Self-pay | Admitting: *Deleted

## 2023-08-03 NOTE — Telephone Encounter (Signed)
 Patient was identified as falling into the [place] Measure - Diabetes.   Patient was: Requires a call back at a later time.  Last A1c completed on 07/25/23.

## 2023-08-17 ENCOUNTER — Encounter: Payer: Self-pay | Admitting: Family

## 2023-08-17 ENCOUNTER — Other Ambulatory Visit: Payer: Self-pay | Admitting: Family

## 2023-08-18 ENCOUNTER — Other Ambulatory Visit: Payer: Self-pay | Admitting: Family

## 2023-08-18 NOTE — Telephone Encounter (Signed)
 Copied from CRM [id number]. Topic: Clinical - Medication Refill >> Aug 18, 2023  1:03 PM [name] wrote: Most Recent Primary Care Visit:  Provider: [facility]  Department: [facility]  Visit Type: OFFICE VISIT  Date: 06/20/2023  Medication: MOUNJARO   Has the patient contacted their pharmacy? Yes (Agent: If no, request that the patient contact the pharmacy for the refill. If patient does not wish to contact the pharmacy document the reason why and proceed with request.) (Agent: If yes, when and what did the pharmacy advise?)  Is this the correct pharmacy for this prescription? Yes If no, delete pharmacy and type the correct one.  This is the patient's preferred pharmacy:  [id number] - [place] ([place]), [place] - [address] [place] ([place] [address] Phone: [phone] Fax: [phone]   Has the prescription been filled recently? No  Is the patient out of the medication? No  Has the patient been seen for an appointment in the last year OR does the patient have an upcoming appointment? No   Can we respond through MyChart? Yes  Agent: Please be advised that Rx refills may take up to 3 business days. We ask that you follow-up with your pharmacy.

## 2023-08-21 ENCOUNTER — Encounter: Payer: Self-pay | Admitting: Family

## 2023-08-29 ENCOUNTER — Telehealth: Payer: Self-pay

## 2023-08-29 NOTE — Telephone Encounter (Signed)
 Noted.

## 2023-08-29 NOTE — Telephone Encounter (Signed)
 Copied from CRM [id number]. Topic: General - Other >> Aug 29, 2023  2:11 PM Julie Dawson wrote: Reason for CRM: [facility] wants to let office know that you can call them at [phone] or email [email or web address] for any DME/prior authorization for dexcom orders. Please advise.

## 2023-08-29 NOTE — Telephone Encounter (Signed)
 Copied from CRM [id number]. Topic: General - Other >> Aug 29, 2023  2:11 PM Juleen Oakland F wrote: Reason for CRM: [facility] wants to let office know that you can call them at [phone] or email [email or web address] for any DME/prior authorization for dexcom orders. Please advise.

## 2023-09-26 ENCOUNTER — Other Ambulatory Visit: Payer: Self-pay | Admitting: Family

## 2023-09-26 ENCOUNTER — Ambulatory Visit (INDEPENDENT_AMBULATORY_CARE_PROVIDER_SITE_OTHER): Admitting: Family

## 2023-09-26 ENCOUNTER — Encounter: Payer: Self-pay | Admitting: Family

## 2023-09-26 ENCOUNTER — Ambulatory Visit: Payer: Self-pay | Admitting: Family

## 2023-09-26 ENCOUNTER — Telehealth: Payer: Self-pay | Admitting: Neurology

## 2023-09-26 VITALS — BP 130/80 | HR 91 | Temp 98.2°F | Wt 155.0 lb

## 2023-09-26 DIAGNOSIS — E118 Type 2 diabetes mellitus with unspecified complications: Secondary | ICD-10-CM | POA: Diagnosis not present

## 2023-09-26 DIAGNOSIS — Z7985 Long-term (current) use of injectable non-insulin antidiabetic drugs: Secondary | ICD-10-CM

## 2023-09-26 DIAGNOSIS — Z720 Tobacco use: Secondary | ICD-10-CM | POA: Diagnosis not present

## 2023-09-26 DIAGNOSIS — E7800 Pure hypercholesterolemia, unspecified: Secondary | ICD-10-CM | POA: Diagnosis not present

## 2023-09-26 LAB — HEMOGLOBIN A1C: Hgb A1c MFr Bld: 6.6 % — ABNORMAL HIGH (ref 4.6–6.5)

## 2023-09-26 LAB — LIPID PANEL
Cholesterol: 197 mg/dL (ref 0–200)
HDL: 34.1 mg/dL — ABNORMAL LOW (ref >39.00)
LDL Cholesterol: 138 mg/dL — ABNORMAL HIGH (ref 0–99)
NonHDL: 163.29
Total CHOL/HDL Ratio: 6
Triglycerides: 127 mg/dL (ref 0.0–149.0)
VLDL: 25.4 mg/dL (ref 0.0–40.0)

## 2023-09-26 LAB — COMPREHENSIVE METABOLIC PANEL WITH GFR
ALT: 12 U/L (ref 0–35)
AST: 13 U/L (ref 0–37)
Albumin: 4.5 g/dL (ref 3.5–5.2)
Alkaline Phosphatase: 102 U/L (ref 39–117)
BUN: 12 mg/dL (ref 6–23)
CO2: 24 meq/L (ref 19–32)
Calcium: 9.1 mg/dL (ref 8.4–10.5)
Chloride: 105 meq/L (ref 96–112)
Creatinine, Ser: 1.07 mg/dL (ref 0.40–1.20)
GFR: 55.61 mL/min — ABNORMAL LOW (ref >60.00)
Glucose, Bld: 80 mg/dL (ref 70–99)
Potassium: 4 meq/L (ref 3.5–5.1)
Sodium: 139 meq/L (ref 135–145)
Total Bilirubin: 0.7 mg/dL (ref 0.2–1.2)
Total Protein: 7 g/dL (ref 6.0–8.3)

## 2023-09-26 MED ORDER — ROSUVASTATIN CALCIUM 10 MG PO TABS: 10.0000 mg | ORAL_TABLET | Freq: Every day | ORAL | 1 refills | Status: AC

## 2023-09-26 NOTE — Progress Notes (Signed)
 Julie Dawson is a 63 y.o. 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 (HCC) 05/19/2023   Chronic constipation 05/05/2020   Tinea manus 12/03/2019   Allergic contact dermatitis due to metals 12/03/2019   Temporal pain 04/29/2019   Numbness of fingers of both hands 04/29/2019   Chronic bilateral low back pain without sciatica 02/04/2018   Cigarette nicotine dependence with nicotine-induced disorder 07/13/2017   Type 2 diabetes mellitus with complication, without long-term current use of insulin  (HCC) 08/23/2016   PPD positive, treated 03/14/2013   Nonspecific reaction to tuberculin skin test without active tuberculosis 06/08/2010   INSOMNIA 12/31/2009   Herpes simplex virus (HSV) infection 01/21/2009   ONYCHOMYCOSIS 07/29/2008   HYPERCHOLESTEROLEMIA 07/29/2008   Dental caries 04/21/2008   OBESITY 03/13/2008   MENOPAUSAL SYNDROME 03/13/2008   Headache 03/13/2008   DRUG ABUSE, HX OF 03/13/2008    Current Outpatient Medications  Medication Sig Dispense Refill   acyclovir  (ZOVIRAX ) 200 MG capsule Take 1 capsule (200 mg total) by mouth 5 (five) times daily. For up to 3 days for cold sore 30 capsule 1   Blood Glucose Monitoring Suppl DEVI 1 each by Does not apply route in the morning, at noon, and at bedtime. May substitute to any manufacturer covered by patient's insurance. 1 each 0   Blood Glucose Monitoring Suppl DEVI 1 each by Does not apply route in the morning, at noon, and at bedtime. May substitute to any manufacturer covered by patient's insurance. 1 each 0   MOUNJARO  7.5 MG/0.5ML Pen INJECT 1/2 (ONE-HALF) ML SUBCUTANEOUSLY  ONCE A WEEK 4 mL 0   pregabalin  (LYRICA ) 75 MG capsule Take 1 capsule (75 mg total) by mouth 2 (two) times daily. 60 capsule 1   No current facility-administered medications for this visit.    Allergies: Ace inhibitors,  Jardiance  [empagliflozin ], Lisinopril , and Metformin  and related  Past Medical History:  Diagnosis Date   Allergy    spring time only    Constipation    doesnt use otc medicines   Diabetes mellitus without complication (HCC)    History of positive PPD    cannot do the tine test,  has to do CXR   Hyperlipidemia     Past Surgical History:  Procedure Laterality Date   cervical adenitis node remova     ECTOPIC PREGNANCY SURGERY     MOUTH SURGERY     for wisdom teeth removal    Family History  Problem Relation Age of Onset   Diabetes Mother    Hypertension Mother    COPD Mother    Stroke Father    Diabetes Father    Hypertension Father    Diabetes Sister    Alcohol abuse Sister    Drug abuse Brother    Colon cancer Neg Hx    Colon polyps Neg Hx    Esophageal cancer Neg Hx    Rectal cancer Neg Hx    Stomach cancer Neg Hx    Breast cancer Neg Hx     Social History   Tobacco Use   Smoking status: Former    Current packs/day: 0.00    Types: Cigarettes    Quit date: 05/12/2023    Years since quitting: 0.3   Smokeless tobacco: Former    Quit date: 05/12/2023  Substance Use Topics   Alcohol use: No    Subjective:  Follow up on Mounjaro - doing well on the medication; notes that blood sugars are averaging 98-111;   Stopped the Atorvastatin  on her own about a month ago- has been feeling better;   Did re-start smoking 2 months ago- notes that her husband has been recently diagnosed with lung cancer and "my nerves are bad";      Objective:  Vitals:   09/26/23 0855  BP: 130/80  Pulse: 91  Temp: 98.2 F (36.8 C)  TempSrc: Oral  SpO2: 98%  Weight: 155 lb (70.3 kg)    General: Well developed, well nourished, in no acute distress  Skin : Warm and dry.  Head: Normocephalic and atraumatic  Lungs: Respirations unlabored; clear to auscultation bilaterally without wheeze, rales, rhonchi  CVS exam: normal rate and regular rhythm.  Neurologic: Alert and oriented; speech  intact; face symmetrical; moves all extremities well; CNII-XII intact without focal deficit   Assessment:  1. Tobacco abuse   2. Type 2 diabetes mellitus with complication, without long-term current use of insulin  (HCC)   3. HYPERCHOLESTEROLEMIA     Plan:  Order update for lung cancer screen; patient aware that she needs to quit smoking but notes she is not ready at this time; Doing well on Mounjaro ; will update labs today; follow up in 4-6 months, sooner prn. Patient stopped Lipitor due to concerns for joint aches- will update labs today; to consider trial of Crestor or Repatha;   No follow-ups on file.  Orders Placed This Encounter  Procedures   Comp Met (CMET)   Lipid panel   Hemoglobin A1c   Ambulatory Referral Lung Cancer Screening [facility]    Referral Priority:   Routine    Referral Type:   Consultation    Referral Reason:   Specialty Services Required    Number of Visits Requested:   1    Requested Prescriptions    No prescriptions requested or ordered in this encounter

## 2023-09-26 NOTE — Telephone Encounter (Signed)
 Pt called to cancel appt. PT states  found out what was going on .  Appt Canceled

## 2023-10-03 ENCOUNTER — Ambulatory Visit: Admitting: Neurology

## 2023-10-16 ENCOUNTER — Other Ambulatory Visit: Payer: Self-pay | Admitting: Family

## 2023-10-18 ENCOUNTER — Encounter: Payer: Self-pay | Admitting: Family

## 2023-10-18 MED ORDER — MOUNJARO 7.5 MG/0.5ML ~~LOC~~ SOAJ: 7.5000 mg | SUBCUTANEOUS | 1 refills | Status: DC

## 2023-10-26 ENCOUNTER — Telehealth: Payer: Self-pay

## 2023-10-26 DIAGNOSIS — Z122 Encounter for screening for malignant neoplasm of respiratory organs: Secondary | ICD-10-CM

## 2023-10-26 DIAGNOSIS — F17210 Nicotine dependence, cigarettes, uncomplicated: Secondary | ICD-10-CM

## 2023-10-26 DIAGNOSIS — Z87891 Personal history of nicotine dependence: Secondary | ICD-10-CM

## 2023-10-26 NOTE — Telephone Encounter (Signed)
.  Lung Cancer Screening Narrative/Criteria Questionnaire (Cigarette Smokers Only- No Cigars/Pipes/vapes)   Julie Dawson   SDMV:11/08/2023 at 8:00 am        04-11-1961                           LDCT: 11/09/2023 at 7:30 am at GI    63 y.o.   Phone: [phone]  Lung Screening Narrative (confirm age 50-77 yrs Medicare / 50-80 yrs Private pay insurance)   Insurance information: BCBS   Referring Provider: Jason, NP   This screening involves an initial phone call with a team member from our program. It is called a shared decision making visit. The initial meeting is required by  insurance and Medicare to make sure you understand the program. This appointment takes about 15-20 minutes to complete. You will complete the screening scan at your scheduled date/time.  This scan takes about 5-10 minutes to complete. You can eat and drink normally before and after the scan.  Criteria questions for Lung Cancer Screening:   Are you a current or former smoker? Current Age began smoking: 13   If you are a former smoker, what year did you quit smoking? Quit 2 years (within 15 yrs)   To calculate your smoking history, I need an accurate estimate of how many packs of cigarettes you smoked per day and for how many years. (Not just the number of PPD you are now smoking)   Years smoking 47 x Packs per day 1 = Pack years 47   (at least 20 pack yrs)   (Make sure they understand that we need to know how much they have smoked in the past, not just the number of PPD they are smoking now)  Do you have a personal history of cancer?  No    Do you have a family history of cancer? No  Are you coughing up blood?  No  Have you had unexplained weight loss of 15 lbs or more in the last 6 months? No  It looks like you meet all criteria.  When would be a good time for us  to schedule you for this screening?   Additional information: N/A

## 2023-11-08 ENCOUNTER — Ambulatory Visit: Admitting: Acute Care

## 2023-11-08 DIAGNOSIS — F17210 Nicotine dependence, cigarettes, uncomplicated: Secondary | ICD-10-CM | POA: Diagnosis not present

## 2023-11-08 NOTE — Patient Instructions (Signed)

## 2023-11-08 NOTE — Progress Notes (Signed)
 Virtual Visit via Telephone Note  I connected with Julie Dawson on 11/08/23 at  8:00 AM EDT by telephone and verified that I am speaking with the correct person using two identifiers.  Location: Patient: [name] Provider: Laneta Speaks, RN   I discussed the limitations, risks, security and privacy concerns of performing an evaluation and management service by telephone and the availability of in person appointments. I also discussed with the patient that there may be a patient responsible charge related to this service. The patient expressed understanding and agreed to proceed.    Shared Decision Making Visit Lung Cancer Screening Program [phone])   Eligibility: Age 63 y.o. Pack Years Smoking History Calculation 49 (# packs/per year x # years smoked) Recent History of coughing up blood  no Unexplained weight loss? no ( >Than 15 pounds within the last 6 months ) Prior History Lung / other cancer no (Diagnosis within the last 5 years already requiring surveillance chest CT Scans). Smoking Status Current Smoker Former Smokers: Years since quit: n/a  Quit Date: n/a  Visit Components: Discussion included one or more decision making aids. yes Discussion included risk/benefits of screening. yes Discussion included potential follow up diagnostic testing for abnormal scans. yes Discussion included meaning and risk of over diagnosis. yes Discussion included meaning and risk of False Positives. yes Discussion included meaning of total radiation exposure. yes  Counseling Included: Importance of adherence to annual lung cancer LDCT screening. yes Impact of comorbidities on ability to participate in the program. yes Ability and willingness to under diagnostic treatment. yes  Smoking Cessation Counseling: Current Smokers:  Discussed importance of smoking cessation. yes Information about tobacco cessation classes and interventions provided to patient. yes Patient provided  with ticket for LDCT Scan. no Symptomatic Patient. no  Counseling(Intermediate counseling: > three minutes) 99406 Diagnosis Code: Tobacco Use Z72.0 Asymptomatic Patient yes  Counseling (Intermediate counseling: > three minutes counseling) [id number] Former Smokers:  Discussed the importance of maintaining cigarette abstinence. yes Diagnosis Code: Personal History of Nicotine Dependence. S12.108 Information about tobacco cessation classes and interventions provided to patient. Yes Patient provided with ticket for LDCT Scan. no Written Order for Lung Cancer Screening with LDCT placed in Epic. Yes (CT Chest Lung Cancer Screening Low Dose W/O CM) [id number] Z12.2-Screening of respiratory organs Z87.891-Personal history of nicotine dependence   Laneta Speaks, RN

## 2023-11-09 ENCOUNTER — Ambulatory Visit
Admission: RE | Admit: 2023-11-09 | Discharge: 2023-11-09 | Disposition: A | Discharge status: Home / Self Care | Source: Ambulatory Visit | Attending: Acute Care | Admitting: Acute Care

## 2023-11-09 DIAGNOSIS — F17210 Nicotine dependence, cigarettes, uncomplicated: Secondary | ICD-10-CM | POA: Diagnosis not present

## 2023-11-09 DIAGNOSIS — Z122 Encounter for screening for malignant neoplasm of respiratory organs: Secondary | ICD-10-CM

## 2023-11-09 DIAGNOSIS — Z87891 Personal history of nicotine dependence: Secondary | ICD-10-CM

## 2023-11-10 ENCOUNTER — Encounter: Payer: Self-pay | Admitting: Family

## 2023-11-15 ENCOUNTER — Ambulatory Visit: Admitting: Neurology

## 2023-11-18 ENCOUNTER — Encounter (HOSPITAL_BASED_OUTPATIENT_CLINIC_OR_DEPARTMENT_OTHER): Payer: Self-pay

## 2023-11-18 ENCOUNTER — Emergency Department (HOSPITAL_BASED_OUTPATIENT_CLINIC_OR_DEPARTMENT_OTHER)
Admission: EM | Admit: 2023-11-18 | Discharge: 2023-11-18 | Disposition: A | Discharge status: Home / Self Care | Attending: Emergency Medicine | Admitting: Emergency Medicine

## 2023-11-18 ENCOUNTER — Emergency Department (HOSPITAL_BASED_OUTPATIENT_CLINIC_OR_DEPARTMENT_OTHER)

## 2023-11-18 DIAGNOSIS — R200 Anesthesia of skin: Secondary | ICD-10-CM | POA: Insufficient documentation

## 2023-11-18 DIAGNOSIS — M25511 Pain in right shoulder: Secondary | ICD-10-CM | POA: Insufficient documentation

## 2023-11-18 DIAGNOSIS — M19011 Primary osteoarthritis, right shoulder: Secondary | ICD-10-CM | POA: Diagnosis not present

## 2023-11-18 MED ORDER — IBUPROFEN 600 MG PO TABS: 600.0000 mg | ORAL_TABLET | Freq: Three times a day (TID) | ORAL | 0 refills | Status: DC | PRN

## 2023-11-18 NOTE — ED Provider Notes (Signed)
 [place] EMERGENCY DEPARTMENT AT [facility] Provider Note   CSN: [id number] Arrival date & time: 11/18/23  9072     Patient presents with: numb finger   Julie Dawson is a 63 y.o. female.  She is right-hand dominant.  Complaining of pain in her right shoulder since yesterday.  No clear trauma.  Pain is worse with movement and she feels a clicking sensation there.  Radiates up into her neck.  She also has new numbness of her right fourth finger.  No trauma to her hand.  No change in strength.   The history is provided by the patient.       Prior to Admission medications   Medication Sig Start Date End Date Taking? Authorizing Provider  acyclovir  (ZOVIRAX ) 200 MG capsule Take 1 capsule (200 mg total) by mouth 5 (five) times daily. For up to 3 days for cold sore 12/14/22   Jason Leita Repine, FNP  Blood Glucose Monitoring Suppl DEVI 1 each by Does not apply route in the morning, at noon, and at bedtime. May substitute to any manufacturer covered by patient's insurance. 06/20/23   Jason Leita Repine, FNP  Blood Glucose Monitoring Suppl DEVI 1 each by Does not apply route in the morning, at noon, and at bedtime. May substitute to any manufacturer covered by patient's insurance. 06/20/23   Jason Leita Repine, FNP  pregabalin  (LYRICA ) 75 MG capsule Take 1 capsule (75 mg total) by mouth 2 (two) times daily. 04/24/23 06/23/23  Georgina Ozell LABOR, MD  rosuvastatin  (CRESTOR ) 10 MG tablet Take 1 tablet (10 mg total) by mouth daily. 09/26/23   Jason Leita Repine, FNP  tirzepatide  (MOUNJARO ) 7.5 MG/0.5ML Pen Inject 7.5 mg into the skin once a week. 10/18/23   Jason Leita Repine, FNP    Allergies: Ace inhibitors, Jardiance  [empagliflozin ], Lisinopril , and Metformin  and related    Review of Systems  Constitutional:  Negative for fever.  HENT:  Negative for sore throat.   Respiratory:  Negative for shortness of breath.   Cardiovascular:  Negative for chest pain.   Gastrointestinal:  Negative for abdominal pain.  Genitourinary:  Negative for dysuria.  Musculoskeletal:  Positive for neck pain.  Skin:  Negative for rash.  Neurological:  Positive for numbness. Negative for weakness.    Updated Vital Signs BP 128/70   Pulse 89   Temp 98.5 F (36.9 C) (Oral)   Resp 12   SpO2 98%   Physical Exam Vitals and nursing note reviewed.  Constitutional:      General: She is not in acute distress.    Appearance: Normal appearance. She is well-developed.  HENT:     Head: Normocephalic and atraumatic.  Eyes:     Conjunctiva/sclera: Conjunctivae normal.  Cardiovascular:     Rate and Rhythm: Normal rate and regular rhythm.     Heart sounds: No murmur heard. Pulmonary:     Effort: Pulmonary effort is normal. No respiratory distress.     Breath sounds: Normal breath sounds. No stridor. No wheezing.  Abdominal:     Palpations: Abdomen is soft.     Tenderness: There is no abdominal tenderness. There is no guarding or rebound.  Musculoskeletal:        General: No tenderness or deformity. Normal range of motion.     Cervical back: Neck supple.  Skin:    General: Skin is warm and dry.  Neurological:     General: No focal deficit present.     Mental Status: She is  alert.     GCS: GCS eye subscore is 4. GCS verbal subscore is 5. GCS motor subscore is 6.     Sensory: Sensory deficit present.     Motor: No weakness.     Comments: She has decreased sensation of her right fourth distal finger.  She has normal strength.  No other focal deficits appreciated     (all labs ordered are listed, but only abnormal results are displayed) Labs Reviewed - No data to display  EKG: None  Radiology: DG Shoulder Right Result Date: 11/18/2023 CLINICAL DATA:  Shoulder pain.  No reported history of trauma EXAM: RIGHT SHOULDER - 5 VIEW COMPARISON:  None Available. FINDINGS: No fracture or dislocation. Minimal degenerative changes identified. Minimal osteophytes.  Preserved bone mineralization. Artifact from the patient's clothing. IMPRESSION: Minimal degenerative changes. Electronically Signed   By: Ranell Bring M.D.   On: 11/18/2023 11:49     Procedures   Medications Ordered in the ED - No data to display  Clinical Course as of 11/18/23 1728  Sat Nov 18, 2023  1145 X-rays of right shoulder do not show any obvious fracture or dislocation.  Awaiting radiology reading. [MB]    Clinical Course User Index [MB] Towana Ozell BROCKS, MD                                 Medical Decision Making Amount and/or Complexity of Data Reviewed Radiology: ordered.  Risk Prescription drug management.   This patient complains of right shoulder pain right fourth finger numbness; this involves an extensive number of treatment Options and is a complaint that carries with it a high risk of complications and morbidity. The differential includes musculoskeletal pain, peripheral neuropathy, radiculopathy I ordered imaging studies which included x-rays right shoulder and I independently    visualized and interpreted imaging which showed no fracture or dislocation Previous records obtained and reviewed in epic, no recent admissions  Social determinants considered, no significant barriers Critical Interventions: None  After the interventions stated above, I reevaluated the patient and found patient to be nontoxic-appearing Admission and further testing considered, no indications for admission at this time.  Will treat with NSAIDs and local compress, recommend close PCP follow-up.  Return instructions discussed.      Final diagnoses:  Acute pain of right shoulder  Numbness of finger    ED Discharge Orders          Ordered    ibuprofen  (ADVIL ) 600 MG tablet  Every 8 hours PRN        11/18/23 1207               Towana Ozell BROCKS, MD 11/18/23 1730

## 2023-11-18 NOTE — Discharge Instructions (Addendum)
 You were seen in the emergency department for some neck and right shoulder pain along with numbness and tingling in your right ring finger.  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  Your symptoms are likely due to some pressure on the nerve coming from your neck to your hand.  Please use ice to your neck and shoulder area.  Ibuprofen  3 times a day with food.  Follow-up with your regular doctor.  Return if any worsening or concerning symptoms

## 2023-11-18 NOTE — ED Triage Notes (Signed)
 She c/o numbness of right 4th finger since yesterday. Today she also is c/o some right shoulder/neck discomfort with no worsening of the finger numbness. She is ambulatory and in no distress.

## 2023-11-20 ENCOUNTER — Other Ambulatory Visit: Payer: Self-pay | Admitting: Acute Care

## 2023-11-20 DIAGNOSIS — Z122 Encounter for screening for malignant neoplasm of respiratory organs: Secondary | ICD-10-CM

## 2023-11-20 DIAGNOSIS — F17210 Nicotine dependence, cigarettes, uncomplicated: Secondary | ICD-10-CM

## 2023-11-20 DIAGNOSIS — Z87891 Personal history of nicotine dependence: Secondary | ICD-10-CM

## 2023-11-21 ENCOUNTER — Encounter: Payer: Self-pay | Admitting: Family

## 2023-11-27 ENCOUNTER — Other Ambulatory Visit: Payer: Self-pay | Admitting: Family

## 2023-12-09 ENCOUNTER — Other Ambulatory Visit: Payer: Self-pay | Admitting: Family

## 2023-12-26 ENCOUNTER — Encounter: Payer: Self-pay | Admitting: Family

## 2024-01-02 ENCOUNTER — Other Ambulatory Visit: Payer: Self-pay | Admitting: Family

## 2024-01-02 ENCOUNTER — Encounter: Payer: Self-pay | Admitting: Family

## 2024-01-02 ENCOUNTER — Ambulatory Visit: Payer: Self-pay | Admitting: Family

## 2024-01-02 ENCOUNTER — Ambulatory Visit (HOSPITAL_BASED_OUTPATIENT_CLINIC_OR_DEPARTMENT_OTHER)
Admission: RE | Admit: 2024-01-02 | Discharge: 2024-01-02 | Disposition: A | Discharge status: Home / Self Care | Source: Ambulatory Visit | Attending: Family | Admitting: Family

## 2024-01-02 ENCOUNTER — Ambulatory Visit (INDEPENDENT_AMBULATORY_CARE_PROVIDER_SITE_OTHER): Admitting: Family

## 2024-01-02 VITALS — BP 144/82 | HR 99 | Ht 63.0 in | Wt 144.6 lb

## 2024-01-02 DIAGNOSIS — K5900 Constipation, unspecified: Secondary | ICD-10-CM | POA: Diagnosis not present

## 2024-01-02 DIAGNOSIS — Z7985 Long-term (current) use of injectable non-insulin antidiabetic drugs: Secondary | ICD-10-CM | POA: Diagnosis not present

## 2024-01-02 DIAGNOSIS — E118 Type 2 diabetes mellitus with unspecified complications: Secondary | ICD-10-CM | POA: Diagnosis not present

## 2024-01-02 NOTE — Progress Notes (Signed)
 Julie Dawson is a 63 y.o. 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 (HCC) 05/19/2023   Chronic constipation 05/05/2020   Tinea manus 12/03/2019   Allergic contact dermatitis due to metals 12/03/2019   Temporal pain 04/29/2019   Numbness of fingers of both hands 04/29/2019   Chronic bilateral low back pain without sciatica 02/04/2018   Cigarette nicotine dependence with nicotine-induced disorder 07/13/2017   Type 2 diabetes mellitus with complication, without long-term current use of insulin  (HCC) 08/23/2016   PPD positive, treated 03/14/2013   Nonspecific reaction to tuberculin skin test without active tuberculosis 06/08/2010   INSOMNIA 12/31/2009   Herpes simplex virus (HSV) infection 01/21/2009   ONYCHOMYCOSIS 07/29/2008   HYPERCHOLESTEROLEMIA 07/29/2008   Dental caries 04/21/2008   OBESITY 03/13/2008   MENOPAUSAL SYNDROME 03/13/2008   Headache 03/13/2008   DRUG ABUSE, HX OF 03/13/2008    Current Outpatient Medications  Medication Sig Dispense Refill   ibuprofen  (ADVIL ) 600 MG tablet Take 1 tablet (600 mg total) by mouth every 8 (eight) hours as needed. 30 tablet 0   MOUNJARO  7.5 MG/0.5ML Pen INJECT 7.5 MG INTO THE SKIN ONCE A WEEK 4 mL 0   rosuvastatin  (CRESTOR ) 10 MG tablet Take 1 tablet (10 mg total) by mouth daily. 90 tablet 1   No current facility-administered medications for this visit.    Allergies: Ace inhibitors, Jardiance  [empagliflozin ], Lisinopril , and Metformin  and related  Past Medical History:  Diagnosis Date   Allergy    spring time only    Constipation    doesnt use otc medicines   Diabetes mellitus without complication (HCC)    History of positive PPD    cannot do the tine test,  has to do CXR   Hyperlipidemia     Past Surgical History:  Procedure Laterality Date   cervical adenitis node remova     ECTOPIC PREGNANCY  SURGERY     MOUTH SURGERY     for wisdom teeth removal    Family History  Problem Relation Age of Onset   Diabetes Mother    Hypertension Mother    COPD Mother    Stroke Father    Diabetes Father    Hypertension Father    Diabetes Sister    Alcohol abuse Sister    Drug abuse Brother    Colon cancer Neg Hx    Colon polyps Neg Hx    Esophageal cancer Neg Hx    Rectal cancer Neg Hx    Stomach cancer Neg Hx    Breast cancer Neg Hx     Social History   Tobacco Use   Smoking status: Former    Current packs/day: 1.00    Average packs/day: 1 pack/day for 49.7 years (49.7 ttl pk-yrs)    Types: Cigarettes    Start date: 1976   Smokeless tobacco: Former    Quit date: 05/12/2023  Substance Use Topics   Alcohol use: No    Subjective:   Patient has been having increased problems with constipation since increasing her Mounjaro  to 7.5 mg; she did not try the Miralax as  previously recommended; she did not have these problems on the lower dosage of Mounjaro - would be interested in staying on medication but lowering dosage;  Has lost 15 pounds since starting Mounjaro ;     Objective:  Vitals:   01/02/24 1442  BP: ROLLEN)  144/82  Pulse: 99  SpO2: 96%  Weight: 144 lb 9.6 oz (65.6 kg)  Height: 5' 3 (1.6 m)    General: Well developed, well nourished, in no acute distress  Skin : Warm and dry.  Head: Normocephalic and atraumatic  Lungs: Respirations unlabored; clear to auscultation bilaterally without wheeze, rales, rhonchi  CVS exam: normal rate and regular rhythm.  Abdomen: Soft; nontender; nondistended; normoactive bowel sounds; no masses or hepatosplenomegaly  Musculoskeletal: No deformities; no active joint inflammation  Extremities: No edema, cyanosis, clubbing  Vessels: Symmetric bilaterally  Neurologic: Alert and oriented; speech intact; face symmetrical; moves all extremities well; CNII-XII intact without focal deficit   Assessment:  1. Type 2 diabetes mellitus with  complication, without long-term current use of insulin  (HCC)   2. Constipation, unspecified constipation type     Plan:  Discussed that constipation is very common side effect with GLP-1; patient notes she did not have any issues on the lower dosage of Mounjaro ; check CBC, CMP, hgba1c today; will update abdominal X-ray to rule out obstruction; stressed that she does need to go ahead and use an OTC option to help with constipation until dosage is adjusted.   No follow-ups on file.  Orders Placed This Encounter  Procedures   DG Abd 2 Views    Standing Status:   Future    Number of Occurrences:   1    Expiration Date:   01/01/2025    Reason for Exam (SYMPTOM  OR DIAGNOSIS REQUIRED):   constipation/ rule out obstruction    Preferred imaging location?:   [facility]   CBC with Differential/Platelet   Comp Met (CMET)   Hemoglobin A1c    Requested Prescriptions    No prescriptions requested or ordered in this encounter

## 2024-01-02 NOTE — Patient Instructions (Signed)
 [facility] [employer]- [address] [place], [place] [address] Tel: [phone] [place], Lauraine Krabbe)

## 2024-01-03 ENCOUNTER — Other Ambulatory Visit: Payer: Self-pay | Admitting: Family

## 2024-01-03 LAB — CBC WITH DIFFERENTIAL/PLATELET
Basophils Absolute: 0 K/uL (ref 0.0–0.1)
Basophils Relative: 0.3 % (ref 0.0–3.0)
Eosinophils Absolute: 0.1 K/uL (ref 0.0–0.7)
Eosinophils Relative: 1 % (ref 0.0–5.0)
HCT: 40.8 % (ref 36.0–46.0)
Hemoglobin: 13.6 g/dL (ref 12.0–15.0)
Lymphocytes Relative: 43.6 % (ref 12.0–46.0)
Lymphs Abs: 3.5 K/uL (ref 0.7–4.0)
MCHC: 33.3 g/dL (ref 30.0–36.0)
MCV: 82.1 fl (ref 78.0–100.0)
Monocytes Absolute: 0.5 K/uL (ref 0.1–1.0)
Monocytes Relative: 6 % (ref 3.0–12.0)
Neutro Abs: 3.9 K/uL (ref 1.4–7.7)
Neutrophils Relative %: 49.1 % (ref 43.0–77.0)
Platelets: 251 K/uL (ref 150.0–400.0)
RBC: 4.97 Mil/uL (ref 3.87–5.11)
RDW: 14.1 % (ref 11.5–15.5)
WBC: 8 K/uL (ref 4.0–10.5)

## 2024-01-03 LAB — HEMOGLOBIN A1C: Hgb A1c MFr Bld: 6.4 % (ref 4.6–6.5)

## 2024-01-03 LAB — COMPREHENSIVE METABOLIC PANEL WITH GFR
ALT: 11 U/L (ref 0–35)
AST: 13 U/L (ref 0–37)
Albumin: 4.6 g/dL (ref 3.5–5.2)
Alkaline Phosphatase: 105 U/L (ref 39–117)
BUN: 15 mg/dL (ref 6–23)
CO2: 26 meq/L (ref 19–32)
Calcium: 9.1 mg/dL (ref 8.4–10.5)
Chloride: 104 meq/L (ref 96–112)
Creatinine, Ser: 1.21 mg/dL — ABNORMAL HIGH (ref 0.40–1.20)
GFR: 47.89 mL/min — ABNORMAL LOW (ref >60.00)
Glucose, Bld: 107 mg/dL — ABNORMAL HIGH (ref 70–99)
Potassium: 4.2 meq/L (ref 3.5–5.1)
Sodium: 140 meq/L (ref 135–145)
Total Bilirubin: 0.8 mg/dL (ref 0.2–1.2)
Total Protein: 7.2 g/dL (ref 6.0–8.3)

## 2024-01-03 MED ORDER — TIRZEPATIDE 5 MG/0.5ML ~~LOC~~ SOAJ: 5.0000 mg | SUBCUTANEOUS | 0 refills | Status: DC

## 2024-01-09 ENCOUNTER — Other Ambulatory Visit: Payer: Self-pay | Admitting: Family

## 2024-01-09 ENCOUNTER — Encounter: Payer: Self-pay | Admitting: Family

## 2024-01-18 NOTE — Progress Notes (Signed)
 Virtual Visit via Telephone Note   I connected with [name] on 11/08/23 at  8:00 AM EDT by telephone and verified that I am speaking with the correct person using two identifiers.   Location: Patient: at home Provider: [address] [place], [place], [address]    I discussed the limitations, risks, security and privacy concerns of performing an evaluation and management service by telephone and the availability of in person appointments. I also discussed with the patient that there may be a patient responsible charge related to this service. The patient expressed understanding and agreed to proceed.       Shared Decision Making Visit Lung Cancer Screening Program [phone])     Eligibility: Age 63 y.o. Pack Years Smoking History Calculation 49 (# packs/per year x # years smoked) Recent History of coughing up blood  no Unexplained weight loss? no ( >Than 15 pounds within the last 6 months ) Prior History Lung / other cancer no (Diagnosis within the last 5 years already requiring surveillance chest CT Scans). Smoking Status Current Smoker Former Smokers: Years since quit: n/a       Quit Date: n/a   Visit Components: Discussion included one or more decision making aids. yes Discussion included risk/benefits of screening. yes Discussion included potential follow up diagnostic testing for abnormal scans. yes Discussion included meaning and risk of over diagnosis. yes Discussion included meaning and risk of False Positives. yes Discussion included meaning of total radiation exposure. yes   Counseling Included: Importance of adherence to annual lung cancer LDCT screening. yes Impact of comorbidities on ability to participate in the program. yes Ability and willingness to under diagnostic treatment. yes   Smoking Cessation Counseling: Current Smokers:  Discussed importance of smoking cessation. yes Information about tobacco cessation classes and interventions  provided to patient. yes Patient provided with ticket for LDCT Scan. no Symptomatic Patient. no             Counseling(Intermediate counseling: > three minutes) 99406 Diagnosis Code: Tobacco Use Z72.0 Asymptomatic Patient yes       Counseled patient 4 minutes regarding tobacco use.   Former Smokers:  Discussed the importance of maintaining cigarette abstinence. yes Diagnosis Code: Personal History of Nicotine Dependence. S12.108 Information about tobacco cessation classes and interventions provided to patient. Yes Patient provided with ticket for LDCT Scan. no Written Order for Lung Cancer Screening with LDCT placed in Epic. Yes (CT Chest Lung Cancer Screening Low Dose W/O CM) [id number] Z12.2-Screening of respiratory organs Z87.891-Personal history of nicotine dependence     Laneta Speaks, RN

## 2024-03-04 ENCOUNTER — Encounter: Payer: Self-pay | Admitting: Radiology

## 2024-03-18 ENCOUNTER — Other Ambulatory Visit: Payer: Self-pay | Admitting: Family

## 2024-03-18 MED ORDER — TIRZEPATIDE 5 MG/0.5ML ~~LOC~~ SOAJ: 5.0000 mg | SUBCUTANEOUS | 0 refills | Status: DC

## 2024-03-18 NOTE — Telephone Encounter (Unsigned)
 Copied from CRM #[id number]. Topic: Clinical - Medication Refill >> Mar 18, 2024 11:06 AM Berwyn MATSU wrote: Medication: tirzepatide  (MOUNJARO ) 5 MG/0.5ML Pen   Has the patient contacted their pharmacy? Yes (Agent: If no, request that the patient contact the pharmacy for the refill. If patient does not wish to contact the pharmacy document the reason why and proceed with request.) (Agent: If yes, when and what did the pharmacy advise?)  This is the patient's preferred pharmacy:  [id number] - [place] ([place] - [address] [place] ([place]) [place] [address] Phone: [phone] Fax: [phone]  Is this the correct pharmacy for this prescription? Yes If no, delete pharmacy and type the correct one.   Has the prescription been filled recently? Yes  Is the patient out of the medication? Yes  Has the patient been seen for an appointment in the last year OR does the patient have an upcoming appointment? Yes  Can we respond through MyChart? No  Agent: Please be advised that Rx refills may take up to 3 business days. We ask that you follow-up with your pharmacy.

## 2024-03-20 ENCOUNTER — Telehealth: Payer: Self-pay | Admitting: Family

## 2024-03-20 NOTE — Telephone Encounter (Signed)
 Copied from CRM [id number]. Topic: General - Other >> Mar 20, 2024 10:39 AM Alfonso HERO wrote: Reason for CRM: patient calling to see about getting her A1C checked says it been 3 months and she doesn't see the new dr until march Is it ok to schedule her for lab appt?

## 2024-03-25 NOTE — Telephone Encounter (Signed)
 Patient cannot have labs until she is seen by new provider.

## 2024-04-01 ENCOUNTER — Emergency Department (HOSPITAL_BASED_OUTPATIENT_CLINIC_OR_DEPARTMENT_OTHER)

## 2024-04-01 ENCOUNTER — Emergency Department (HOSPITAL_BASED_OUTPATIENT_CLINIC_OR_DEPARTMENT_OTHER)
Admission: EM | Admit: 2024-04-01 | Discharge: 2024-04-01 | Disposition: A | Discharge status: Home / Self Care | Attending: Emergency Medicine | Admitting: Emergency Medicine

## 2024-04-01 ENCOUNTER — Encounter (HOSPITAL_BASED_OUTPATIENT_CLINIC_OR_DEPARTMENT_OTHER): Payer: Self-pay

## 2024-04-01 ENCOUNTER — Other Ambulatory Visit: Payer: Self-pay

## 2024-04-01 DIAGNOSIS — E119 Type 2 diabetes mellitus without complications: Secondary | ICD-10-CM | POA: Diagnosis not present

## 2024-04-01 DIAGNOSIS — H538 Other visual disturbances: Secondary | ICD-10-CM | POA: Insufficient documentation

## 2024-04-01 DIAGNOSIS — R202 Paresthesia of skin: Secondary | ICD-10-CM | POA: Diagnosis not present

## 2024-04-01 DIAGNOSIS — R29818 Other symptoms and signs involving the nervous system: Secondary | ICD-10-CM | POA: Diagnosis not present

## 2024-04-01 DIAGNOSIS — Z794 Long term (current) use of insulin: Secondary | ICD-10-CM | POA: Insufficient documentation

## 2024-04-01 LAB — BASIC METABOLIC PANEL WITH GFR
Anion gap: 10 (ref 5–15)
BUN: 15 mg/dL (ref 8–23)
CO2: 24 mmol/L (ref 22–32)
Calcium: 9.1 mg/dL (ref 8.9–10.3)
Chloride: 106 mmol/L (ref 98–111)
Creatinine, Ser: 1.08 mg/dL — ABNORMAL HIGH (ref 0.44–1.00)
GFR, Estimated: 57 mL/min — ABNORMAL LOW (ref >60)
Glucose, Bld: 125 mg/dL — ABNORMAL HIGH (ref 70–99)
Potassium: 3.7 mmol/L (ref 3.5–5.1)
Sodium: 140 mmol/L (ref 135–145)

## 2024-04-01 LAB — CBC WITH DIFFERENTIAL/PLATELET
Abs Immature Granulocytes: 0.02 K/uL (ref 0.00–0.07)
Basophils Absolute: 0 K/uL (ref 0.0–0.1)
Basophils Relative: 1 %
Eosinophils Absolute: 0.1 K/uL (ref 0.0–0.5)
Eosinophils Relative: 1 %
HCT: 37.9 % (ref 36.0–46.0)
Hemoglobin: 12.9 g/dL (ref 12.0–15.0)
Immature Granulocytes: 0 %
Lymphocytes Relative: 48 %
Lymphs Abs: 4 K/uL (ref 0.7–4.0)
MCH: 27.7 pg (ref 26.0–34.0)
MCHC: 34 g/dL (ref 30.0–36.0)
MCV: 81.3 fL (ref 80.0–100.0)
Monocytes Absolute: 0.6 K/uL (ref 0.1–1.0)
Monocytes Relative: 7 %
Neutro Abs: 3.5 K/uL (ref 1.7–7.7)
Neutrophils Relative %: 43 %
Platelets: 221 K/uL (ref 150–400)
RBC: 4.66 MIL/uL (ref 3.87–5.11)
RDW: 13.2 % (ref 11.5–15.5)
WBC: 8.2 K/uL (ref 4.0–10.5)
nRBC: 0 % (ref 0.0–0.2)

## 2024-04-01 NOTE — ED Provider Notes (Signed)
 [place] EMERGENCY DEPARTMENT AT [facility] Provider Note   CSN: [id number] Arrival date & time: 04/01/24  9570     Patient presents with: No chief complaint on file.   Julie Dawson is a 63 y.o. female.   Patient is a 63 year old female with history of hyperlipidemia, type 2 diabetes.  Patient presenting today with complaints of blurry vision.  She states that she woke from sleep this morning and her vision was blurry.  She also describes numbness to the fingertips of her right hand.  No headache.  No injury or trauma.  She checked her blood sugar at home and reports it was 123, but is still concerned her sugar may be running high.       Prior to Admission medications   Medication Sig Start Date End Date Taking? Authorizing Provider  ibuprofen  (ADVIL ) 600 MG tablet Take 1 tablet (600 mg total) by mouth every 8 (eight) hours as needed. 11/18/23   Towana Ozell BROCKS, MD  rosuvastatin  (CRESTOR ) 10 MG tablet Take 1 tablet (10 mg total) by mouth daily. 09/26/23   Jason Leita Repine, FNP  tirzepatide  (MOUNJARO ) 5 MG/0.5ML Pen Inject 5 mg into the skin once a week. 03/18/24   Webb, Padonda B, FNP    Allergies: Ace inhibitors, Jardiance  [empagliflozin ], Lisinopril , and Metformin  and related    Review of Systems  All other systems reviewed and are negative.   Updated Vital Signs BP (!) 143/88 (BP Location: Right Arm)   Pulse 90   Resp 18   SpO2 100%   Physical Exam Vitals and nursing note reviewed.  Constitutional:      General: She is not in acute distress.    Appearance: She is well-developed. She is not diaphoretic.  HENT:     Head: Normocephalic and atraumatic.  Eyes:     Extraocular Movements: Extraocular movements intact.     Pupils: Pupils are equal, round, and reactive to light.  Cardiovascular:     Rate and Rhythm: Normal rate and regular rhythm.     Heart sounds: No murmur heard.    No friction rub. No gallop.  Pulmonary:     Effort: Pulmonary  effort is normal. No respiratory distress.     Breath sounds: Normal breath sounds. No wheezing.  Abdominal:     General: Bowel sounds are normal. There is no distension.     Palpations: Abdomen is soft.     Tenderness: There is no abdominal tenderness.  Musculoskeletal:        General: Normal range of motion.     Cervical back: Normal range of motion and neck supple.  Skin:    General: Skin is warm and dry.  Neurological:     General: No focal deficit present.     Mental Status: She is alert and oriented to person, place, and time.     Cranial Nerves: No cranial nerve deficit.     Motor: No weakness.     (all labs ordered are listed, but only abnormal results are displayed) Labs Reviewed - No data to display  EKG: None  Radiology: No results found.   Procedures   Medications Ordered in the ED - No data to display                                  Medical Decision Making Amount and/or Complexity of Data Reviewed Labs: ordered. Radiology: ordered.   Patient  is a 63 year old female with history of diabetes, hyperlipidemia.  Patient presenting with blurry vision and concerns of elevated blood sugar.  Patient arrives here with stable vital signs and is afebrile.  Her physical examination is unremarkable and she is neurologically intact.  She reports that her blurry vision has basically resolved prior to arrival.  Laboratory studies obtained including CBC and basic metabolic panel, both of which are unremarkable.  CT scan of the head shows no acute process.  The cause of the patient's blurry vision is unclear, however nothing appears emergent.  This seemed to begin when she woke from sleep and perhaps the transition from dark to light is the cause.  Either way, she appears neurologically intact and blurry vision has resolved.  She will be discharged with as needed return.     Final diagnoses:  None    ED Discharge Orders     None          Geroldine Berg,  MD 04/01/24 [phone]

## 2024-04-01 NOTE — Discharge Instructions (Signed)
Follow-up with your primary doctor and return to the emergency department if symptoms significantly worsen or change.

## 2024-04-01 NOTE — ED Triage Notes (Signed)
 Arrives POV. States she woke up at 0300 this morning with bilateral blurry vision in the eyes. Left worse than right. States this happened in the past when her A1c was high. Also has numbness in R fingers. Hasn't had labs checked in 3 months.

## 2024-04-10 ENCOUNTER — Inpatient Hospital Stay: Admitting: Family Medicine

## 2024-05-21 ENCOUNTER — Other Ambulatory Visit: Payer: Self-pay | Admitting: Family Medicine

## 2024-05-21 DIAGNOSIS — Z1231 Encounter for screening mammogram for malignant neoplasm of breast: Secondary | ICD-10-CM

## 2024-05-27 ENCOUNTER — Other Ambulatory Visit: Payer: Self-pay | Admitting: Medical Genetics

## 2024-05-29 ENCOUNTER — Ambulatory Visit

## 2024-05-29 DIAGNOSIS — Z1231 Encounter for screening mammogram for malignant neoplasm of breast: Secondary | ICD-10-CM

## 2024-05-30 ENCOUNTER — Ambulatory Visit
Admission: RE | Admit: 2024-05-30 | Discharge: 2024-05-30 | Disposition: A | Discharge status: Home / Self Care | Source: Ambulatory Visit | Attending: Family Medicine | Admitting: Family Medicine

## 2024-05-30 DIAGNOSIS — Z1231 Encounter for screening mammogram for malignant neoplasm of breast: Secondary | ICD-10-CM

## 2024-06-06 ENCOUNTER — Ambulatory Visit: Admitting: Family

## 2024-06-06 ENCOUNTER — Encounter: Payer: Self-pay | Admitting: Family

## 2024-06-06 VITALS — BP 124/62 | HR 96 | Temp 98.0°F | Resp 18 | Ht 63.0 in | Wt 142.2 lb

## 2024-06-06 DIAGNOSIS — E118 Type 2 diabetes mellitus with unspecified complications: Secondary | ICD-10-CM

## 2024-06-06 DIAGNOSIS — R10A2 Flank pain, left side: Secondary | ICD-10-CM

## 2024-06-06 DIAGNOSIS — E7800 Pure hypercholesterolemia, unspecified: Secondary | ICD-10-CM

## 2024-06-06 DIAGNOSIS — B009 Herpesviral infection, unspecified: Secondary | ICD-10-CM

## 2024-06-06 LAB — POCT URINALYSIS DIPSTICK OB
Bilirubin, UA: NEGATIVE
Blood, UA: NEGATIVE
Glucose, UA: NEGATIVE
Ketones, UA: NEGATIVE
Leukocytes, UA: NEGATIVE
Nitrite, UA: NEGATIVE
Spec Grav, UA: 1.015
Urobilinogen, UA: 0.2 U/dL
pH, UA: 6

## 2024-06-06 MED ORDER — ACYCLOVIR 400 MG PO TABS: 400.0000 mg | ORAL_TABLET | Freq: Every day | ORAL | 0 refills | Status: AC

## 2024-06-06 MED ORDER — TIRZEPATIDE 5 MG/0.5ML ~~LOC~~ SOAJ: 5.0000 mg | SUBCUTANEOUS | 3 refills | Status: AC

## 2024-06-07 LAB — COMPREHENSIVE METABOLIC PANEL WITH GFR
AG Ratio: 2 (calc) (ref 1.0–2.5)
ALT: 11 U/L (ref 6–29)
AST: 13 U/L (ref 10–35)
Albumin: 4.6 g/dL (ref 3.6–5.1)
Alkaline phosphatase (APISO): 108 U/L (ref 37–153)
BUN/Creatinine Ratio: 16 (calc) (ref 6–22)
BUN: 18 mg/dL (ref 7–25)
CO2: 25 mmol/L (ref 20–32)
Calcium: 8.7 mg/dL (ref 8.6–10.4)
Chloride: 109 mmol/L (ref 98–110)
Creat: 1.11 mg/dL — ABNORMAL HIGH (ref 0.50–1.05)
Globulin: 2.3 g/dL (ref 1.9–3.7)
Glucose, Bld: 81 mg/dL (ref 65–139)
Potassium: 3.9 mmol/L (ref 3.5–5.3)
Sodium: 140 mmol/L (ref 135–146)
Total Bilirubin: 0.6 mg/dL (ref 0.2–1.2)
Total Protein: 6.9 g/dL (ref 6.1–8.1)
eGFR: 56 mL/min/{1.73_m2} — ABNORMAL LOW

## 2024-06-07 LAB — CBC WITH DIFFERENTIAL/PLATELET
Absolute Lymphocytes: 4216 {cells}/uL — ABNORMAL HIGH (ref 850–3900)
Absolute Monocytes: 544 {cells}/uL (ref 200–950)
Basophils Absolute: 43 {cells}/uL (ref 0–200)
Basophils Relative: 0.5 %
Eosinophils Absolute: 102 {cells}/uL (ref 15–500)
Eosinophils Relative: 1.2 %
HCT: 38.9 % (ref 35.9–46.0)
Hemoglobin: 13.1 g/dL (ref 11.7–15.5)
MCH: 27.8 pg (ref 27.0–33.0)
MCHC: 33.7 g/dL (ref 31.6–35.4)
MCV: 82.6 fL (ref 81.4–101.7)
MPV: 10.1 fL (ref 7.5–12.5)
Monocytes Relative: 6.4 %
Neutro Abs: 3596 {cells}/uL (ref 1500–7800)
Neutrophils Relative %: 42.3 %
Platelets: 239 10*3/uL (ref 140–400)
RBC: 4.71 Million/uL (ref 3.80–5.10)
RDW: 13.3 % (ref 11.0–15.0)
Total Lymphocyte: 49.6 %
WBC: 8.5 10*3/uL (ref 3.8–10.8)

## 2024-06-07 LAB — TSH: TSH: 1.23 m[IU]/L (ref 0.40–4.50)

## 2024-06-07 LAB — MICROALBUMIN / CREATININE URINE RATIO
Creatinine, Urine: 96 mg/dL (ref 20–275)
Microalb Creat Ratio: 6 mg/g{creat}
Microalb, Ur: 0.6 mg/dL

## 2024-06-07 LAB — URINE CULTURE
MICRO NUMBER:: 17554175
Result:: NO GROWTH
SPECIMEN QUALITY:: ADEQUATE

## 2024-06-07 LAB — HEMOGLOBIN A1C
Hgb A1c MFr Bld: 5.8 % — ABNORMAL HIGH
Mean Plasma Glucose: 120 mg/dL
eAG (mmol/L): 6.6 mmol/L

## 2024-06-07 LAB — LIPID PANEL
Cholesterol: 241 mg/dL — ABNORMAL HIGH
HDL: 46 mg/dL — ABNORMAL LOW
LDL Cholesterol (Calc): 169 mg/dL — ABNORMAL HIGH
Non-HDL Cholesterol (Calc): 195 mg/dL — ABNORMAL HIGH
Total CHOL/HDL Ratio: 5.2 (calc) — ABNORMAL HIGH
Triglycerides: 127 mg/dL

## 2024-06-10 ENCOUNTER — Encounter: Admitting: Family Medicine

## 2024-06-13 ENCOUNTER — Other Ambulatory Visit

## 2024-07-02 ENCOUNTER — Encounter: Admitting: Family Medicine

## 2024-11-21 ENCOUNTER — Encounter: Admitting: Family Medicine

## 2024-12-06 ENCOUNTER — Encounter: Admitting: Family
